# Patient Record
Sex: Female | Born: 1940
Health system: Southern US, Community
[De-identification: ages and names within clinical notes are randomized; demographics above are authoritative.]

## PROBLEM LIST (undated history)

## (undated) DIAGNOSIS — R32 Unspecified urinary incontinence: Secondary | ICD-10-CM

## (undated) DIAGNOSIS — M1990 Unspecified osteoarthritis, unspecified site: Secondary | ICD-10-CM

## (undated) DIAGNOSIS — M4800 Spinal stenosis, site unspecified: Secondary | ICD-10-CM

## (undated) DIAGNOSIS — F5104 Psychophysiologic insomnia: Secondary | ICD-10-CM

## (undated) DIAGNOSIS — E785 Hyperlipidemia, unspecified: Secondary | ICD-10-CM

## (undated) DIAGNOSIS — M797 Fibromyalgia: Secondary | ICD-10-CM

## (undated) DIAGNOSIS — R609 Edema, unspecified: Principal | ICD-10-CM

## (undated) DIAGNOSIS — M5431 Sciatica, right side: Secondary | ICD-10-CM

## (undated) DIAGNOSIS — D649 Anemia, unspecified: Secondary | ICD-10-CM

## (undated) DIAGNOSIS — N183 Chronic kidney disease, stage 3 (moderate): Secondary | ICD-10-CM

## (undated) DIAGNOSIS — F419 Anxiety disorder, unspecified: Secondary | ICD-10-CM

## (undated) DIAGNOSIS — F329 Major depressive disorder, single episode, unspecified: Secondary | ICD-10-CM

## (undated) DIAGNOSIS — R269 Unspecified abnormalities of gait and mobility: Secondary | ICD-10-CM

## (undated) DIAGNOSIS — I4891 Unspecified atrial fibrillation: Secondary | ICD-10-CM

## (undated) DIAGNOSIS — N3281 Overactive bladder: Secondary | ICD-10-CM

## (undated) DIAGNOSIS — E079 Disorder of thyroid, unspecified: Secondary | ICD-10-CM

## (undated) DIAGNOSIS — I10 Essential (primary) hypertension: Secondary | ICD-10-CM

## (undated) DIAGNOSIS — E669 Obesity, unspecified: Secondary | ICD-10-CM

## (undated) HISTORY — DX: Spinal stenosis, site unspecified: M48.00

## (undated) HISTORY — DX: Chronic kidney disease, stage 3 (moderate): N18.3

## (undated) HISTORY — DX: Obesity, unspecified: E66.9

## (undated) HISTORY — DX: Hyperlipidemia, unspecified: E78.5

## (undated) HISTORY — PX: SHOULDER SURGERY: SHX246

## (undated) HISTORY — DX: Unspecified osteoarthritis, unspecified site: M19.90

## (undated) HISTORY — DX: Disorder of thyroid, unspecified: E07.9

## (undated) HISTORY — DX: Unspecified urinary incontinence: R32

## (undated) HISTORY — DX: Overactive bladder: N32.81

## (undated) HISTORY — DX: Essential (primary) hypertension: I10

## (undated) HISTORY — DX: Unspecified atrial fibrillation: I48.91

## (undated) HISTORY — DX: Anemia, unspecified: D64.9

## (undated) HISTORY — DX: Anxiety disorder, unspecified: F41.9

## (undated) HISTORY — DX: Sciatica, right side: M54.31

## (undated) HISTORY — PX: THYROIDECTOMY, PARTIAL: SHX18

## (undated) HISTORY — DX: Unspecified abnormalities of gait and mobility: R26.9

## (undated) HISTORY — DX: Major depressive disorder, single episode, unspecified: F32.9

## (undated) HISTORY — DX: Edema, unspecified: R60.9

## (undated) HISTORY — DX: Fibromyalgia: M79.7

## (undated) HISTORY — DX: Psychophysiologic insomnia: F51.04

## (undated) HISTORY — PX: REPLACEMENT TOTAL KNEE BILATERAL: SUR1225

---

## 2011-09-07 ENCOUNTER — Encounter: Payer: Self-pay | Admitting: Internal Medicine

## 2011-09-11 ENCOUNTER — Other Ambulatory Visit: Payer: Self-pay | Admitting: Internal Medicine

## 2011-09-11 ENCOUNTER — Ambulatory Visit (INDEPENDENT_AMBULATORY_CARE_PROVIDER_SITE_OTHER): Payer: Medicare Other | Admitting: Internal Medicine

## 2011-09-11 ENCOUNTER — Encounter: Payer: Self-pay | Admitting: Internal Medicine

## 2011-09-11 ENCOUNTER — Other Ambulatory Visit (INDEPENDENT_AMBULATORY_CARE_PROVIDER_SITE_OTHER): Payer: Medicare Other

## 2011-09-11 VITALS — BP 128/84 | HR 78 | Temp 98.1°F | Ht 68.0 in | Wt 333.0 lb

## 2011-09-11 DIAGNOSIS — E079 Disorder of thyroid, unspecified: Secondary | ICD-10-CM | POA: Insufficient documentation

## 2011-09-11 DIAGNOSIS — F419 Anxiety disorder, unspecified: Secondary | ICD-10-CM | POA: Insufficient documentation

## 2011-09-11 DIAGNOSIS — E669 Obesity, unspecified: Secondary | ICD-10-CM

## 2011-09-11 DIAGNOSIS — E785 Hyperlipidemia, unspecified: Secondary | ICD-10-CM

## 2011-09-11 DIAGNOSIS — R5383 Other fatigue: Secondary | ICD-10-CM

## 2011-09-11 DIAGNOSIS — E119 Type 2 diabetes mellitus without complications: Secondary | ICD-10-CM | POA: Insufficient documentation

## 2011-09-11 DIAGNOSIS — IMO0001 Reserved for inherently not codable concepts without codable children: Secondary | ICD-10-CM

## 2011-09-11 DIAGNOSIS — F5104 Psychophysiologic insomnia: Secondary | ICD-10-CM

## 2011-09-11 DIAGNOSIS — R5381 Other malaise: Secondary | ICD-10-CM

## 2011-09-11 DIAGNOSIS — G8929 Other chronic pain: Secondary | ICD-10-CM

## 2011-09-11 DIAGNOSIS — I10 Essential (primary) hypertension: Secondary | ICD-10-CM

## 2011-09-11 DIAGNOSIS — M1990 Unspecified osteoarthritis, unspecified site: Secondary | ICD-10-CM | POA: Insufficient documentation

## 2011-09-11 DIAGNOSIS — M4800 Spinal stenosis, site unspecified: Secondary | ICD-10-CM

## 2011-09-11 DIAGNOSIS — F329 Major depressive disorder, single episode, unspecified: Secondary | ICD-10-CM | POA: Insufficient documentation

## 2011-09-11 DIAGNOSIS — M797 Fibromyalgia: Secondary | ICD-10-CM

## 2011-09-11 DIAGNOSIS — F32A Depression, unspecified: Secondary | ICD-10-CM | POA: Insufficient documentation

## 2011-09-11 DIAGNOSIS — G4700 Insomnia, unspecified: Secondary | ICD-10-CM

## 2011-09-11 DIAGNOSIS — M109 Gout, unspecified: Secondary | ICD-10-CM | POA: Insufficient documentation

## 2011-09-11 DIAGNOSIS — M5431 Sciatica, right side: Secondary | ICD-10-CM | POA: Insufficient documentation

## 2011-09-11 HISTORY — DX: Obesity, unspecified: E66.9

## 2011-09-11 HISTORY — DX: Sciatica, right side: M54.31

## 2011-09-11 HISTORY — DX: Depression, unspecified: F32.A

## 2011-09-11 HISTORY — DX: Psychophysiologic insomnia: F51.04

## 2011-09-11 HISTORY — DX: Fibromyalgia: M79.7

## 2011-09-11 HISTORY — DX: Spinal stenosis, site unspecified: M48.00

## 2011-09-11 LAB — URINALYSIS, ROUTINE W REFLEX MICROSCOPIC
Ketones, ur: NEGATIVE
Specific Gravity, Urine: 1.025 (ref 1.000–1.030)
Urine Glucose: NEGATIVE
Urobilinogen, UA: 0.2 (ref 0.0–1.0)
pH: 6 (ref 5.0–8.0)

## 2011-09-11 LAB — CBC WITH DIFFERENTIAL/PLATELET
Basophils Absolute: 0 10*3/uL (ref 0.0–0.1)
Eosinophils Relative: 3.7 % (ref 0.0–5.0)
HCT: 43.1 % (ref 36.0–46.0)
Hemoglobin: 14.5 g/dL (ref 12.0–15.0)
Lymphocytes Relative: 25.4 % (ref 12.0–46.0)
Lymphs Abs: 2.1 10*3/uL (ref 0.7–4.0)
Monocytes Relative: 7.2 % (ref 3.0–12.0)
Neutro Abs: 5.3 10*3/uL (ref 1.4–7.7)
RBC: 4.46 Mil/uL (ref 3.87–5.11)
WBC: 8.5 10*3/uL (ref 4.5–10.5)

## 2011-09-11 LAB — HEPATIC FUNCTION PANEL
AST: 19 U/L (ref 0–37)
Albumin: 4.2 g/dL (ref 3.5–5.2)
Total Protein: 7.9 g/dL (ref 6.0–8.3)

## 2011-09-11 LAB — MICROALBUMIN / CREATININE URINE RATIO
Creatinine,U: 190.8 mg/dL
Microalb Creat Ratio: 5 mg/g (ref 0.0–30.0)
Microalb, Ur: 9.5 mg/dL — ABNORMAL HIGH (ref 0.0–1.9)

## 2011-09-11 LAB — BASIC METABOLIC PANEL
BUN: 33 mg/dL — ABNORMAL HIGH (ref 6–23)
CO2: 28 mEq/L (ref 19–32)
Calcium: 9.9 mg/dL (ref 8.4–10.5)
Creatinine, Ser: 1.1 mg/dL (ref 0.4–1.2)
GFR: 51.72 mL/min — ABNORMAL LOW (ref >60.00)
Glucose, Bld: 105 mg/dL — ABNORMAL HIGH (ref 70–99)
Sodium: 141 mEq/L (ref 135–145)

## 2011-09-11 LAB — LIPID PANEL
Cholesterol: 219 mg/dL — ABNORMAL HIGH (ref 0–200)
HDL: 48.2 mg/dL (ref >39.00)
Total CHOL/HDL Ratio: 5
Triglycerides: 161 mg/dL — ABNORMAL HIGH (ref 0.0–149.0)

## 2011-09-11 LAB — LDL CHOLESTEROL, DIRECT: Direct LDL: 143.6 mg/dL

## 2011-09-11 LAB — TSH: TSH: 1.92 u[IU]/mL (ref 0.35–5.50)

## 2011-09-11 MED ORDER — OXYCODONE-ACETAMINOPHEN 5-325 MG PO TABS: 1.0000 | ORAL_TABLET | Freq: Every day | ORAL | Status: DC | PRN

## 2011-09-11 MED ORDER — ZOLPIDEM TARTRATE ER 6.25 MG PO TBCR: 6.2500 mg | EXTENDED_RELEASE_TABLET | Freq: Every evening | ORAL | Status: DC | PRN

## 2011-09-11 MED ORDER — DULOXETINE HCL 60 MG PO CPEP: 60.0000 mg | ORAL_CAPSULE | Freq: Every day | ORAL | Status: DC

## 2011-09-11 MED ORDER — CEPHALEXIN 500 MG PO CAPS: 500.0000 mg | ORAL_CAPSULE | Freq: Four times a day (QID) | ORAL | Status: AC

## 2011-09-11 MED ORDER — ATORVASTATIN CALCIUM 40 MG PO TABS: 40.0000 mg | ORAL_TABLET | Freq: Every day | ORAL | Status: DC

## 2011-09-11 NOTE — Assessment & Plan Note (Addendum)
With chronic pain to right side, hip, knee, essentially non wt bearing on the right side - progressively for the last 5 yrs, has not yet seen ortho for back, hip and knee recently, has rejected ESI in the past , now with walker with seat  (cane not enough), uses walker in the home, ok for home health assessment  Note:   Pt history, examination, determination of and d/w pt of diagnoses and plan with >40 min face to face, total >50 min with documentation and arranging tests and referrals

## 2011-09-11 NOTE — Assessment & Plan Note (Signed)
stable overall by hx and exam,, and pt to continue medical treatment as before, for labs today

## 2011-09-11 NOTE — Assessment & Plan Note (Signed)
Overall stable but out of meds - for pain med refill -

## 2011-09-11 NOTE — Patient Instructions (Addendum)
Please sign the Release of Information form to allow [place] to get the information from [place] Take all new medications as prescribed - the sleep medication Continue all other medications as before - you are given the refills today You will be contacted regarding the referral for: MRI for the lower back, and the orthopedic Please go to LAB in the Basement for the blood and/or urine tests to be done today Please call the phone number [phone] (the PhoneTree System) for results of testing in 2-3 days;  When calling, simply dial the number, and when prompted enter the MRN number above (the Medical Record Number) and the # key, then the message should start. You will be contacted regarding the referral for: Home Health The cymbalta was sent to the pharmacy Please return in 3 months

## 2011-09-12 NOTE — Progress Notes (Signed)
Subjective:    Patient ID: Bonnie Clark, female    DOB: 08/26/1941, 70 y.o.   MRN: [id number]  HPI Here as new pt to establish;  Here to f/u; overall doing ok,  Pt denies chest pain, increased sob or doe, wheezing, orthopnea, PND, increased LE swelling, palpitations, dizziness or syncope.  Pt denies new neurological symptoms such as new headache, or facial or extremity weakness or numbness   Pt denies polydipsia, polyuria, or low sugar symptoms such as weakness or confusion improved with po intake.  Pt states overall good compliance with meds, trying to follow lower cholesterol, diabetic diet, wt overall stable but little exercise however.  Has ongoing spinal stenosis, and has marked difficulty with ambulation for several yrs, sits to be pushed currently on a rolling walker with seat, states she needs DME for the home, wheelchair and prob scooter, but has not had ongoing ortho eval for recent worsening LBP/right leg/right knee pain.  Etiology unclear, Exam otherwise benign, to check labs as documented, follow with expectant management  Does have ongoing sleep difficulty as well. Past Medical History  Diagnosis Date  . Urine incontinence   . Hx: UTI (urinary tract infection)   . Arthritis   . Depression   . Diabetes mellitus   . Hyperlipidemia   . Hypertension   . Thyroid disease   . Obesity 09/11/2011  . Chronic insomnia 09/11/2011  . Spinal stenosis 09/11/2011  . Fibromyalgia 09/11/2011  . Anxiety and depression 09/11/2011  . Sciatica of right side 09/11/2011  . Gout 09/11/2011   Past Surgical History  Procedure Date  . Thyroidectomy, partial     at 70yo  . Shoulder surgery     right - approx 2005  . Replacement total knee bilateral     right approx 1994, left approx 1999    reports that she has never smoked. She does not have any smokeless tobacco history on file. She reports that she does not drink alcohol or use illicit drugs. family history includes Arthritis in her father and  mother and Depression in her mother. No Known Allergies No current outpatient prescriptions on file prior to visit.   Review of Systems Review of Systems  Constitutional: Negative for diaphoresis and unexpected weight change.  HENT: Negative for drooling and tinnitus.   Eyes: Negative for photophobia and visual disturbance.  Respiratory: Negative for choking and stridor.   Gastrointestinal: Negative for vomiting and blood in stool.  Genitourinary: Negative for hematuria and decreased urine volume.       Objective:   Physical Exam BP 128/84  Pulse 78  Temp(Src) 98.1 F (36.7 C) (Oral)  Ht 5\' 8"  (1.727 m)  Wt 333 lb (151.048 kg)  BMI 50.63 kg/m2  SpO2 98% Physical Exam  VS noted, morbid obese Constitutional: Pt appears well-developed and well-nourished.  HENT: Head: Normocephalic.  Right Ear: External ear normal.  Left Ear: External ear normal.  Eyes: Conjunctivae and EOM are normal. Pupils are equal, round, and reactive to light.  Neck: Normal range of motion. Neck supple.  Cardiovascular: Normal rate and regular rhythm.   Pulmonary/Chest: Effort normal and breath sounds normal.  Abd:  Soft, NT, non-distended, + BS Neurological: Pt is alert. No cranial nerve deficit. o/w not done detail Skin: Skin is warm. No erythema.  Psychiatric: Pt behavior is normal. Thought content normal. 2+ nervous Lumbar diffuse tender, nonswelling, no rash or erythema; right knee with diffuse bony DJD change, trace effusion, NT     Assessment & Plan:

## 2011-09-14 ENCOUNTER — Encounter: Payer: Self-pay | Admitting: Internal Medicine

## 2011-09-14 NOTE — Assessment & Plan Note (Signed)
stable overall by hx and exam,t, and pt to continue medical treatment as before   

## 2011-09-14 NOTE — Assessment & Plan Note (Signed)
Etiology unclear, Exam otherwise benign, to check labs as documented, follow with expectant management  

## 2011-09-14 NOTE — Assessment & Plan Note (Signed)
stable overall by hx and exam, most recent data reviewed with pt, and pt to continue medical treatment as before  BP Readings from Last 3 Encounters:  09/11/11 128/84

## 2011-09-14 NOTE — Assessment & Plan Note (Signed)
Ok for ambien prn,.  to f/u any worsening symptoms or concerns  

## 2011-09-18 ENCOUNTER — Other Ambulatory Visit: Payer: Self-pay | Admitting: Internal Medicine

## 2011-09-18 MED ORDER — DIAZEPAM 5 MG PO TABS: ORAL_TABLET | ORAL | Status: DC

## 2011-09-22 ENCOUNTER — Inpatient Hospital Stay: Admission: RE | Admit: 2011-09-22 | Payer: Medicare Other | Source: Ambulatory Visit

## 2011-09-22 ENCOUNTER — Ambulatory Visit: Payer: Medicare Other

## 2011-09-26 ENCOUNTER — Ambulatory Visit (INDEPENDENT_AMBULATORY_CARE_PROVIDER_SITE_OTHER): Payer: Medicare Other

## 2011-09-26 ENCOUNTER — Ambulatory Visit
Admission: RE | Admit: 2011-09-26 | Discharge: 2011-09-26 | Disposition: A | Discharge status: Home / Self Care | Payer: Medicare Other | Source: Ambulatory Visit | Attending: Internal Medicine | Admitting: Internal Medicine

## 2011-09-26 DIAGNOSIS — M4800 Spinal stenosis, site unspecified: Secondary | ICD-10-CM

## 2011-09-26 DIAGNOSIS — Z23 Encounter for immunization: Secondary | ICD-10-CM

## 2011-09-30 DIAGNOSIS — E119 Type 2 diabetes mellitus without complications: Secondary | ICD-10-CM

## 2011-09-30 DIAGNOSIS — M4800 Spinal stenosis, site unspecified: Secondary | ICD-10-CM

## 2011-09-30 DIAGNOSIS — A499 Bacterial infection, unspecified: Secondary | ICD-10-CM

## 2011-09-30 DIAGNOSIS — N390 Urinary tract infection, site not specified: Secondary | ICD-10-CM

## 2011-10-02 ENCOUNTER — Other Ambulatory Visit: Payer: Self-pay | Admitting: Specialist

## 2011-10-02 DIAGNOSIS — M48061 Spinal stenosis, lumbar region without neurogenic claudication: Secondary | ICD-10-CM

## 2011-10-08 ENCOUNTER — Ambulatory Visit
Admission: RE | Admit: 2011-10-08 | Discharge: 2011-10-08 | Disposition: A | Discharge status: Home / Self Care | Payer: Medicare Other | Source: Ambulatory Visit | Attending: Specialist | Admitting: Specialist

## 2011-10-08 DIAGNOSIS — M48061 Spinal stenosis, lumbar region without neurogenic claudication: Secondary | ICD-10-CM

## 2011-10-08 MED ORDER — IOHEXOL 300 MG/ML  SOLN
10.0000 mL | Freq: Once | INTRAMUSCULAR | Status: AC | PRN
  Administered 2011-10-08: 10 mL via INTRATHECAL

## 2011-10-08 MED ORDER — HYDROCODONE-ACETAMINOPHEN 5-325 MG PO TABS
2.0000 | ORAL_TABLET | Freq: Once | ORAL | Status: AC
  Administered 2011-10-08: 2 via ORAL

## 2011-10-08 MED ORDER — DIAZEPAM 5 MG PO TABS
5.0000 mg | ORAL_TABLET | Freq: Once | ORAL | Status: AC
  Administered 2011-10-08: 5 mg via ORAL

## 2011-10-08 NOTE — Patient Instructions (Signed)

## 2011-10-08 NOTE — Progress Notes (Signed)
Husband at bedside.  Patient resting comfortably at present.  jkl

## 2011-10-21 ENCOUNTER — Telehealth: Payer: Self-pay

## 2011-10-21 MED ORDER — ZOLPIDEM TARTRATE 5 MG PO TABS: 5.0000 mg | ORAL_TABLET | Freq: Every evening | ORAL | Status: DC | PRN

## 2011-10-21 NOTE — Telephone Encounter (Signed)
Ok for change - Done hardcopy to robin  

## 2011-10-21 NOTE — Telephone Encounter (Signed)
Insurance will not pay for Zolipidem CR 6.25, Insurance may pay for regular Zolpidem. [facility]

## 2011-10-22 NOTE — Telephone Encounter (Signed)
Faxed hardcopy to pharmacy. 

## 2011-11-27 DIAGNOSIS — M4800 Spinal stenosis, site unspecified: Secondary | ICD-10-CM | POA: Diagnosis not present

## 2011-11-27 DIAGNOSIS — E119 Type 2 diabetes mellitus without complications: Secondary | ICD-10-CM | POA: Diagnosis not present

## 2011-11-27 DIAGNOSIS — N390 Urinary tract infection, site not specified: Secondary | ICD-10-CM | POA: Diagnosis not present

## 2011-11-27 DIAGNOSIS — A499 Bacterial infection, unspecified: Secondary | ICD-10-CM | POA: Diagnosis not present

## 2011-11-27 DIAGNOSIS — F329 Major depressive disorder, single episode, unspecified: Secondary | ICD-10-CM | POA: Diagnosis not present

## 2011-11-27 DIAGNOSIS — IMO0001 Reserved for inherently not codable concepts without codable children: Secondary | ICD-10-CM | POA: Diagnosis not present

## 2011-12-05 DIAGNOSIS — M4800 Spinal stenosis, site unspecified: Secondary | ICD-10-CM | POA: Diagnosis not present

## 2011-12-05 DIAGNOSIS — E119 Type 2 diabetes mellitus without complications: Secondary | ICD-10-CM | POA: Diagnosis not present

## 2011-12-05 DIAGNOSIS — N390 Urinary tract infection, site not specified: Secondary | ICD-10-CM | POA: Diagnosis not present

## 2011-12-05 DIAGNOSIS — A499 Bacterial infection, unspecified: Secondary | ICD-10-CM | POA: Diagnosis not present

## 2011-12-15 DIAGNOSIS — A499 Bacterial infection, unspecified: Secondary | ICD-10-CM | POA: Diagnosis not present

## 2011-12-15 DIAGNOSIS — IMO0001 Reserved for inherently not codable concepts without codable children: Secondary | ICD-10-CM | POA: Diagnosis not present

## 2011-12-15 DIAGNOSIS — F329 Major depressive disorder, single episode, unspecified: Secondary | ICD-10-CM | POA: Diagnosis not present

## 2011-12-15 DIAGNOSIS — M4800 Spinal stenosis, site unspecified: Secondary | ICD-10-CM | POA: Diagnosis not present

## 2011-12-15 DIAGNOSIS — E119 Type 2 diabetes mellitus without complications: Secondary | ICD-10-CM | POA: Diagnosis not present

## 2011-12-15 DIAGNOSIS — N390 Urinary tract infection, site not specified: Secondary | ICD-10-CM | POA: Diagnosis not present

## 2011-12-17 DIAGNOSIS — M4800 Spinal stenosis, site unspecified: Secondary | ICD-10-CM | POA: Diagnosis not present

## 2011-12-17 DIAGNOSIS — F329 Major depressive disorder, single episode, unspecified: Secondary | ICD-10-CM | POA: Diagnosis not present

## 2011-12-17 DIAGNOSIS — IMO0001 Reserved for inherently not codable concepts without codable children: Secondary | ICD-10-CM | POA: Diagnosis not present

## 2011-12-17 DIAGNOSIS — A499 Bacterial infection, unspecified: Secondary | ICD-10-CM | POA: Diagnosis not present

## 2011-12-17 DIAGNOSIS — E119 Type 2 diabetes mellitus without complications: Secondary | ICD-10-CM | POA: Diagnosis not present

## 2011-12-17 DIAGNOSIS — N390 Urinary tract infection, site not specified: Secondary | ICD-10-CM | POA: Diagnosis not present

## 2011-12-21 ENCOUNTER — Encounter: Payer: Self-pay | Admitting: Internal Medicine

## 2011-12-21 DIAGNOSIS — R269 Unspecified abnormalities of gait and mobility: Secondary | ICD-10-CM

## 2011-12-21 HISTORY — DX: Unspecified abnormalities of gait and mobility: R26.9

## 2011-12-22 ENCOUNTER — Encounter: Payer: Self-pay | Admitting: Internal Medicine

## 2011-12-22 ENCOUNTER — Telehealth: Payer: Self-pay | Admitting: Internal Medicine

## 2011-12-22 ENCOUNTER — Ambulatory Visit (INDEPENDENT_AMBULATORY_CARE_PROVIDER_SITE_OTHER): Payer: Medicare Other | Admitting: Internal Medicine

## 2011-12-22 ENCOUNTER — Other Ambulatory Visit (INDEPENDENT_AMBULATORY_CARE_PROVIDER_SITE_OTHER): Payer: Medicare Other

## 2011-12-22 VITALS — BP 104/70 | HR 52 | Temp 97.0°F

## 2011-12-22 DIAGNOSIS — G4700 Insomnia, unspecified: Secondary | ICD-10-CM | POA: Diagnosis not present

## 2011-12-22 DIAGNOSIS — F5104 Psychophysiologic insomnia: Secondary | ICD-10-CM

## 2011-12-22 DIAGNOSIS — M4800 Spinal stenosis, site unspecified: Secondary | ICD-10-CM

## 2011-12-22 DIAGNOSIS — G8929 Other chronic pain: Secondary | ICD-10-CM

## 2011-12-22 DIAGNOSIS — IMO0001 Reserved for inherently not codable concepts without codable children: Secondary | ICD-10-CM

## 2011-12-22 DIAGNOSIS — E785 Hyperlipidemia, unspecified: Secondary | ICD-10-CM

## 2011-12-22 LAB — LIPID PANEL
Cholesterol: 155 mg/dL (ref 0–200)
HDL: 45.4 mg/dL (ref >39.00)
LDL Cholesterol: 85 mg/dL (ref 0–99)
VLDL: 24.6 mg/dL (ref 0.0–40.0)

## 2011-12-22 LAB — BASIC METABOLIC PANEL
BUN: 21 mg/dL (ref 6–23)
Calcium: 9.6 mg/dL (ref 8.4–10.5)
Creatinine, Ser: 0.9 mg/dL (ref 0.4–1.2)
GFR: 65.82 mL/min (ref >60.00)
Glucose, Bld: 144 mg/dL — ABNORMAL HIGH (ref 70–99)
Potassium: 4.9 mEq/L (ref 3.5–5.1)

## 2011-12-22 MED ORDER — METFORMIN HCL 500 MG PO TABS: 500.0000 mg | ORAL_TABLET | Freq: Every day | ORAL | Status: DC

## 2011-12-22 MED ORDER — GLUCOSE BLOOD VI STRP: ORAL_STRIP | Status: DC

## 2011-12-22 MED ORDER — OXYCODONE-ACETAMINOPHEN 10-325 MG PO TABS: 1.0000 | ORAL_TABLET | Freq: Three times a day (TID) | ORAL | Status: AC | PRN

## 2011-12-22 NOTE — Assessment & Plan Note (Addendum)
Without significant improvement in stregnth despite MRI/ortho/ESI/PT eval and tx, needs power wheelchair - for rx to be faxed today  Note:  Total time for pt hx, exam, determination of dx and plan for eval and tx is > 40 min

## 2011-12-22 NOTE — Progress Notes (Signed)
Subjective:    Patient ID: Bonnie Clark, female    DOB: 01/07/1941, 71 y.o.   MRN: [id number]  HPI  Here for f/u and power wheelchair exam after seeing orthopedic - Dr Shelle Iron, and Dr Ethelene Hal s/p ESI x 1, pin to the right side helped at least 50% for some time, and to repeat the [facility] tomorrow, mobility somewhat improved, but still rather severe as detailed by recent home PT assessment as well with recommendation for power chair.   Sleeping still very difficult due to pain and hard to position, despite ambien use, which did not seem to help as pain was too much.  Ambulates minimally,  Currently not able to get about in her current manual wheelchair due to overall general debility and lack of good upper body strength. She has to sit on the side of the bed, and cannot lift legs;  Husband has to lift legs to get her into bed;  Pt also unable to get OOB, has to wake the husband for help to get to BR at night when needed.   Denies urinary symptoms such as dysuria, frequency, urgency,or hematuria.   Pt denies chest pain, increased sob or doe, wheezing, orthopnea, PND, increased LE swelling, palpitations, dizziness or syncope.  Pt denies new neurological symptoms such as new headache, or facial or extremity weakness or numbness   Pt denies polydipsia, polyuria, or low sugar symptoms such as weakness or confusion improved with po intake.  Pt states overall good compliance with meds, trying to follow lower cholesterol, diabetic diet, wt overall stable.  Tolerating the new lipitor ok.   Denies urinary symptoms such as dysuria, frequency, urgency,or hematuria. Past Medical History  Diagnosis Date  . Urine incontinence   . Hx: UTI (urinary tract infection)   . Arthritis   . Depression   . Diabetes mellitus   . Hyperlipidemia   . Hypertension   . Thyroid disease   . Obesity 09/11/2011  . Chronic insomnia 09/11/2011  . Spinal stenosis 09/11/2011  . Fibromyalgia 09/11/2011  . Anxiety and depression 09/11/2011  .  Sciatica of right side 09/11/2011  . Gout 09/11/2011  . Gait disorder 12/21/2011   Past Surgical History  Procedure Date  . Thyroidectomy, partial     at 71yo  . Shoulder surgery     right - approx 2005  . Replacement total knee bilateral     right approx 1994, left approx 1999    reports that she has never smoked. She does not have any smokeless tobacco history on file. She reports that she does not drink alcohol or use illicit drugs. family history includes Arthritis in her father and mother and Depression in her mother. No Known Allergies Current Outpatient Prescriptions on File Prior to Visit  Medication Sig Dispense Refill  . allopurinol (ZYLOPRIM) 300 MG tablet Take 1 by mouth daily      . aspirin 81 MG tablet Take 81 mg by mouth daily.        [name] Kitchen atorvastatin (LIPITOR) 40 MG tablet Take 1 tablet (40 mg total) by mouth daily.  90 tablet  3  . colchicine 0.6 MG tablet Take 0.6 mg by mouth daily.        . DULoxetine (CYMBALTA) 60 MG capsule Take 1 capsule (60 mg total) by mouth daily. Take 1 by mouth daily  90 capsule  3  . furosemide (LASIX) 20 MG tablet Take 20 mg by mouth daily.        [name] Kitchen ibuprofen (ADVIL)  200 MG tablet Take 400 mg by mouth 2 (two) times daily.        [name] Kitchen lisinopril (PRINIVIL,ZESTRIL) 5 MG tablet Take 1 by mouth daily       Review of Systems Review of Systems  Constitutional: Negative for diaphoresis and unexpected weight change.  HENT: Negative for drooling and tinnitus.   Eyes: Negative for photophobia and visual disturbance.  Respiratory: Negative for choking and stridor.   Gastrointestinal: Negative for vomiting and blood in stool.  Genitourinary: Negative for hematuria and decreased urine volume.  Objective:   Physical Exam BP 104/70  Pulse 52  Temp(Src) 97 F (36.1 C) (Oral)  SpO2 99% Physical Exam  VS noted, not ill appearing Constitutional: Pt appears well-developed and well-nourished.  HENT: Head: Normocephalic.  Right Ear: External ear normal.    Left Ear: External ear normal.  Eyes: Conjunctivae and EOM are normal. Pupils are equal, round, and reactive to light.  Neck: Normal range of motion. Neck supple.  Cardiovascular: Normal rate and regular rhythm.   Pulmonary/Chest: Effort normal and breath sounds normal.  Skin: Skin is warm. No erythema. No LE edema Spine diffuse tenderness, worse to right paravertebral RLE strength 2/5, LLE 3/5.  Psychiatric: Pt behavior is normal. Thought content normal. 1+ nervous    Assessment & Plan:

## 2011-12-22 NOTE — Assessment & Plan Note (Signed)
Uncontrolled last visit , now on lipitor - tolerating well, for lipids today  Lab Results  Component Value Date   HGBA1C 6.5 09/11/2011    LDL  Last - 165  - oct 2012  Goal ldl < 70

## 2011-12-22 NOTE — Assessment & Plan Note (Signed)
stable overall by hx and exam, most recent data reviewed with pt, and pt to continue medical treatment as before Lab Results  Component Value Date   HGBA1C 6.5 09/11/2011    

## 2011-12-22 NOTE — Patient Instructions (Signed)
Take all new medications as prescribed - the increased oxycodone prescription OK to stop the [place] Continue all other medications as before, including the glucose strips Please call if you need further medication refills (or have the pharmacy call) Please go to LAB in the Basement for the blood and/or urine tests to be done today Please call the phone number [phone] (the PhoneTree System) for results of testing in 2-3 days;  When calling, simply dial the number, and when prompted enter the MRN number above (the Medical Record Number) and the # key, then the message should start. Please return in 6 months, or sooner if needed

## 2011-12-22 NOTE — Assessment & Plan Note (Signed)
Hopefully helped with increased oxycodone, to d/c the [place]

## 2011-12-22 NOTE — Telephone Encounter (Signed)
Lab with mild elev a1c to 7.5 ; goal < 7.0  To start metformin 500 qd  Zella Ball to notify pt

## 2011-12-22 NOTE — Assessment & Plan Note (Signed)
Uncontrolled, for percocet 10 tid prn,  to f/u any worsening symptoms or concerns

## 2011-12-23 DIAGNOSIS — M48061 Spinal stenosis, lumbar region without neurogenic claudication: Secondary | ICD-10-CM | POA: Diagnosis not present

## 2011-12-23 NOTE — Telephone Encounter (Signed)
Patient informed. 

## 2011-12-25 ENCOUNTER — Other Ambulatory Visit: Payer: Self-pay | Admitting: Internal Medicine

## 2011-12-25 MED ORDER — GLUCOSE BLOOD VI STRP: ORAL_STRIP | Status: AC

## 2011-12-26 DIAGNOSIS — N390 Urinary tract infection, site not specified: Secondary | ICD-10-CM | POA: Diagnosis not present

## 2011-12-26 DIAGNOSIS — IMO0001 Reserved for inherently not codable concepts without codable children: Secondary | ICD-10-CM | POA: Diagnosis not present

## 2011-12-26 DIAGNOSIS — E119 Type 2 diabetes mellitus without complications: Secondary | ICD-10-CM | POA: Diagnosis not present

## 2011-12-26 DIAGNOSIS — A499 Bacterial infection, unspecified: Secondary | ICD-10-CM | POA: Diagnosis not present

## 2011-12-26 DIAGNOSIS — M4800 Spinal stenosis, site unspecified: Secondary | ICD-10-CM | POA: Diagnosis not present

## 2011-12-26 DIAGNOSIS — F329 Major depressive disorder, single episode, unspecified: Secondary | ICD-10-CM | POA: Diagnosis not present

## 2012-01-05 ENCOUNTER — Ambulatory Visit (INDEPENDENT_AMBULATORY_CARE_PROVIDER_SITE_OTHER): Payer: Medicare Other | Admitting: Internal Medicine

## 2012-01-05 ENCOUNTER — Encounter: Payer: Self-pay | Admitting: Internal Medicine

## 2012-01-05 VITALS — BP 112/82 | HR 62 | Temp 97.0°F | Ht 67.5 in | Wt 333.0 lb

## 2012-01-05 DIAGNOSIS — M4800 Spinal stenosis, site unspecified: Secondary | ICD-10-CM | POA: Diagnosis not present

## 2012-01-05 DIAGNOSIS — I10 Essential (primary) hypertension: Secondary | ICD-10-CM | POA: Diagnosis not present

## 2012-01-05 MED ORDER — METFORMIN HCL ER 500 MG PO TB24: 500.0000 mg | ORAL_TABLET | Freq: Every day | ORAL | Status: DC

## 2012-01-05 NOTE — Assessment & Plan Note (Addendum)
stable overall by hx and exam, most recent data reviewed with pt, and pt to continue medical treatment as before except change to the ER version of metformin for better tolerability Lab Results  Component Value Date   HGBA1C 7.5* 12/22/2011

## 2012-01-05 NOTE — Progress Notes (Signed)
Subjective:    Patient ID: Bonnie Clark, female    DOB: April 24, 1941, 71 y.o.   MRN: [id number]  HPI The main reasson for the visit today is mobility assessment. Has been seeing orthopedic - Dr Shelle Iron, and Dr Ethelene Hal s/p ESI x 1, pin to the right side helped at least 50% for some time, and to repeat the [facility] tomorrow, mobility somewhat improved, but still rather severe as detailed by recent home PT assessment as well with recommendation for power chair. Sleeping still very difficult due to pain and hard to position, despite ambien use, which did not seem to help as pain was too much. Ambulates minimally, Currently not able to get about in her current manual wheelchair due to overall general debility and lack of good upper body strength. She has to sit on the side of the bed, and cannot lift legs; Husband has to lift legs to get her into bed; Pt also unable to get OOB, has to wake the husband for help to get to BR at night when needed.  Also noted today is s/p right shoulder replacement surgury with cont'd chronic pain, weakness or UE and decreased ROM, as well as left shoulder with same pain/weakness/decreased ROM but no prior surgury (though it was recommended per pt).  Has recurrent cervicalgia as well exacerbated by UE use but no surgical hx or radicular symptoms.  Her specific problems requiring includes spinal stenosis, right sided sciatica recurrent, fibromyalgia, gait disorder as well as chronic bilat shoulder pain and rotater cuff dz despite right shoulder replaceement.  Specifically cannot ambulate room to room in the home, toilet or bathe herself, food prepare for herself or even help with dressing.  Cannot use cane due to inability to ambulate safely due to back pain and shoulder pain with cane use.  Cannot use manual wheelchair due to bilat shoulder pain which is at baseline about 4/10, but 8/10 with use such as lifting or trying to use in propelling manual wheelchair.  Cannot use scooter POV for similar -  unable to effectivly use the tiller mechanism due to uncontrolled increased bilat shoulder pain with such movement, and unable to effect safe transfer, also does not have room in the home to accomodate the turning radius. Pt does have mental and physical capability to operate power wheechair safely in the home.  Pt is willing and motivated to use the power wheelchair in the home.  Also mentions today the metformin 500 mg causes nausea, wants to change  Past Medical History  Diagnosis Date  . Urine incontinence   . Hx: UTI (urinary tract infection)   . Arthritis   . Depression   . Diabetes mellitus   . Hyperlipidemia   . Hypertension   . Thyroid disease   . Obesity 09/11/2011  . Chronic insomnia 09/11/2011  . Spinal stenosis 09/11/2011  . Fibromyalgia 09/11/2011  . Anxiety and depression 09/11/2011  . Sciatica of right side 09/11/2011  . Gout 09/11/2011  . Gait disorder 12/21/2011   Past Surgical History  Procedure Date  . Thyroidectomy, partial     at 71yo  . Shoulder surgery     right - approx 2005  . Replacement total knee bilateral     right approx 1994, left approx 1999    reports that she has never smoked. She does not have any smokeless tobacco history on file. She reports that she does not drink alcohol or use illicit drugs. family history includes Arthritis in her father and mother and  Depression in her mother. No Known Allergies Current Outpatient Prescriptions on File Prior to Visit  Medication Sig Dispense Refill  . allopurinol (ZYLOPRIM) 300 MG tablet Take 1 by mouth daily      . aspirin 81 MG tablet Take 81 mg by mouth daily.        [name] Kitchen atorvastatin (LIPITOR) 40 MG tablet Take 1 tablet (40 mg total) by mouth daily.  90 tablet  3  . colchicine 0.6 MG tablet Take 0.6 mg by mouth daily.        . DULoxetine (CYMBALTA) 60 MG capsule Take 1 capsule (60 mg total) by mouth daily. Take 1 by mouth daily  90 capsule  3  . furosemide (LASIX) 20 MG tablet Take 20 mg by mouth  daily.        [name] Kitchen glucose blood (ONE TOUCH ULTRA TEST) test strip 1 strip per day - Use as instructed  250.02  100 each  12  . ibuprofen (ADVIL) 200 MG tablet Take 400 mg by mouth 2 (two) times daily.        [name] Kitchen lisinopril (PRINIVIL,ZESTRIL) 5 MG tablet Take 1 by mouth daily         Review of Systems Review of Systems  Constitutional: Negative for diaphoresis and unexpected weight change.  HENT: Negative for drooling and tinnitus.   Eyes: Negative for photophobia and visual disturbance.  Respiratory: Negative for choking and stridor.   Gastrointestinal: Negative for vomiting and blood in stool.  Genitourinary: Negative for hematuria and decreased urine volume.     Objective:   Physical Exam BP 112/82  Pulse 62  Temp(Src) 97 F (36.1 C) (Oral)  Ht 5' 7.5" (1.715 m)  Wt 333 lb (151.048 kg)  BMI 51.39 kg/m2  SpO2 97% Physical Exam  VS noted, morbid obese Constitutional: Pt appears well-developed and well-nourished.  HENT: Head: Normocephalic.  Right Ear: External ear normal.  Left Ear: External ear normal.  Eyes: Conjunctivae and EOM are normal. Pupils are equal, round, and reactive to light.  Neck: Normal range of motion. Neck supple.NT spine .  Cardiovascular: Normal rate and regular rhythm.   Pulmonary/Chest: Effort normal and breath sounds normal.  Abd:  Soft, NT, non-distended, + BS Neurological: Pt is alert. No cranial nerve deficit. motor 4/5 to UE limited due to pain with dtr/sens intact Lumbar Spine diffuse mod tenderness, worse to right paravertebral  RLE strength 2/5, LLE 3/5.  Skin: Skin is warm. No erythema.  Psychiatric: Pt behavior is normal. Thought content normal.     Assessment & Plan:

## 2012-01-05 NOTE — Assessment & Plan Note (Addendum)
stable overall by hx and exam, , and pt to continue medical treatment as before, for power wheelchair forms filled out  Total time for evaluation including history, examination, review of record with pt, determination of diagnosis, as well as plan for further eval/treatment is > 40 min;  Total with documentation > 50 min

## 2012-01-05 NOTE — Assessment & Plan Note (Signed)
stable overall by hx and exam, most recent data reviewed with pt, and pt to continue medical treatment as before  BP Readings from Last 3 Encounters:  01/05/12 112/82  12/22/11 104/70  10/08/11 120/59

## 2012-01-05 NOTE — Patient Instructions (Signed)
OK to stop the current short acting metformin 500 mg Start the metformin 500 mg "ER" version Continue all other medications as before The documentation will be completed as discussed for the power wheelchair and faxed tomorrow

## 2012-01-06 ENCOUNTER — Telehealth: Payer: Self-pay

## 2012-01-06 NOTE — Telephone Encounter (Signed)
Chart signed and completed  Please fax signed note to Ms Carolin Coy

## 2012-01-06 NOTE — Telephone Encounter (Signed)
[facility] needs for the MD to lock, close the office note from yesterdays OV (01/05/12). They received copy of note this am, but will need to receive  A copy as well of closed note.

## 2012-01-07 DIAGNOSIS — M48061 Spinal stenosis, lumbar region without neurogenic claudication: Secondary | ICD-10-CM | POA: Diagnosis not present

## 2012-01-07 NOTE — Telephone Encounter (Signed)
Faxed notes to Zenia Resides with [facility]. Also called her left message notes faxed.

## 2012-01-08 ENCOUNTER — Telehealth: Payer: Self-pay | Admitting: Internal Medicine

## 2012-01-08 DIAGNOSIS — Z01810 Encounter for preprocedural cardiovascular examination: Secondary | ICD-10-CM

## 2012-01-08 NOTE — Telephone Encounter (Signed)
The last one was about 10 yrs. Ago.

## 2012-01-08 NOTE — Telephone Encounter (Signed)
Due to her age and cardiac risk factors, she should have stress test before surgury  Done per emr

## 2012-01-08 NOTE — Telephone Encounter (Signed)
Patient informed. 

## 2012-01-08 NOTE — Telephone Encounter (Signed)
Received request for preop clearance for lumbar decompression surgury  Robin to contact pt - has she had  Stress test in the past 1-2 yrs?

## 2012-01-21 ENCOUNTER — Other Ambulatory Visit (HOSPITAL_COMMUNITY): Payer: Medicare Other

## 2012-02-18 ENCOUNTER — Telehealth: Payer: Self-pay

## 2012-02-18 NOTE — Telephone Encounter (Signed)
Called the patient for several days to inquire per MD if still having ortho surgery, if so would need stress test. Left messages to call back.

## 2012-02-19 NOTE — Telephone Encounter (Signed)
Called patient; left message to call back.

## 2012-03-04 ENCOUNTER — Ambulatory Visit: Payer: Medicare Other | Admitting: Internal Medicine

## 2012-03-05 ENCOUNTER — Ambulatory Visit (INDEPENDENT_AMBULATORY_CARE_PROVIDER_SITE_OTHER): Payer: Medicare Other | Admitting: Internal Medicine

## 2012-03-05 ENCOUNTER — Encounter: Payer: Self-pay | Admitting: Internal Medicine

## 2012-03-05 DIAGNOSIS — I10 Essential (primary) hypertension: Secondary | ICD-10-CM

## 2012-03-05 DIAGNOSIS — F419 Anxiety disorder, unspecified: Secondary | ICD-10-CM

## 2012-03-05 DIAGNOSIS — R32 Unspecified urinary incontinence: Secondary | ICD-10-CM | POA: Diagnosis not present

## 2012-03-05 DIAGNOSIS — IMO0001 Reserved for inherently not codable concepts without codable children: Secondary | ICD-10-CM | POA: Diagnosis not present

## 2012-03-05 DIAGNOSIS — F341 Dysthymic disorder: Secondary | ICD-10-CM

## 2012-03-05 DIAGNOSIS — R079 Chest pain, unspecified: Secondary | ICD-10-CM

## 2012-03-05 DIAGNOSIS — M797 Fibromyalgia: Secondary | ICD-10-CM

## 2012-03-05 MED ORDER — CLONAZEPAM 1 MG PO TABS: ORAL_TABLET | ORAL | Status: DC

## 2012-03-05 MED ORDER — TRAMADOL HCL 50 MG PO TABS: 50.0000 mg | ORAL_TABLET | Freq: Four times a day (QID) | ORAL | Status: DC | PRN

## 2012-03-05 NOTE — Assessment & Plan Note (Signed)
Today with worsenig bilat shoulder, upper chest and back pain - for tramadol prn,  to f/u any worsening symptoms or concerns

## 2012-03-05 NOTE — Patient Instructions (Signed)
Take all new medications as prescribed - the medication for nerves Continue all other medications as before Please go to LAB in the Basement for the blood and/or urine tests to be done today You will be contacted by phone if any changes need to be made immediately.  Otherwise, you will receive a letter about your results with an explanation. You will be contacted regarding the referral for: urology, and the stress test I faxed Dr Shelle Iron to say that you are holding off on the lower back surgury for now

## 2012-03-05 NOTE — Progress Notes (Signed)
Subjective:    Patient ID: Bonnie Clark, female    DOB: 02-13-1941, 71 y.o.   MRN: [id number]  HPI  Here to f/u;  Here to f/u; overall doing ok,  Pt denies, increased sob or doe, wheezing, orthopnea, PND, increased LE swelling, palpitations, dizziness or syncope, but has intemittent upper chest pain, as well as shoulder and back pain. .  Pt denies new neurological symptoms such as new headache, or facial or extremity weakness or numbness   Pt denies polydipsia, polyuria, or low sugar symptoms such as weakness or confusion improved with po intake.  Pt states overall fair compliance with meds, mostly trying to follow lower cholesterol, diabetic diet, wt overall stable but little exercise however, and wt cont's to increase.  Has ongoing back pain right lower with RLE radicular pain, she is simply afraid of the lumbar surgury , so she cancelled, and did not do the stress test.  Has hx of FMS, and has percocet but still with ant CP/bilat shoulder and upper back pain; better to lie on her orthopedic chair, but hard to sleep and often up at night, drives her husband crazy, even on current meds including the pain med prior to sleep.  Did sleep 4 hrs last night and felt that was a good night. She states her pain is myofascial, has hx of shoulder replacement- just doe not feel it to be orthopedic in that way.  Also with reurrent worsening overall urinary incontinene without dyrusia, freq , urgency, hematuria, not clear if related to lumbar issue, no fever.   Has freq wetting episodes as she cant move fast enough to get to the BR.  Does have power wheelchair at home. Past Medical History  Diagnosis Date  . Urine incontinence   . Hx: UTI (urinary tract infection)   . Arthritis   . Depression   . Diabetes mellitus   . Hyperlipidemia   . Hypertension   . Thyroid disease   . Obesity 09/11/2011  . Chronic insomnia 09/11/2011  . Spinal stenosis 09/11/2011  . Fibromyalgia 09/11/2011  . Anxiety and depression  09/11/2011  . Sciatica of right side 09/11/2011  . Gout 09/11/2011  . Gait disorder 12/21/2011   Past Surgical History  Procedure Date  . Thyroidectomy, partial     at 71yo  . Shoulder surgery     right - approx 2005  . Replacement total knee bilateral     right approx 1994, left approx 1999    reports that she has never smoked. She does not have any smokeless tobacco history on file. She reports that she does not drink alcohol or use illicit drugs. family history includes Arthritis in her father and mother and Depression in her mother. No Known Allergies Current Outpatient Prescriptions on File Prior to Visit  Medication Sig Dispense Refill  . allopurinol (ZYLOPRIM) 300 MG tablet Take 1 by mouth daily      . aspirin 81 MG tablet Take 81 mg by mouth daily.        [name] Kitchen atorvastatin (LIPITOR) 40 MG tablet Take 1 tablet (40 mg total) by mouth daily.  90 tablet  3  . colchicine 0.6 MG tablet Take 0.6 mg by mouth daily.        . DULoxetine (CYMBALTA) 60 MG capsule Take 1 capsule (60 mg total) by mouth daily. Take 1 by mouth daily  90 capsule  3  . furosemide (LASIX) 20 MG tablet Take 20 mg by mouth daily.        [name] Kitchen  glucose blood (ONE TOUCH ULTRA TEST) test strip 1 strip per day - Use as instructed  250.02  100 each  12  . ibuprofen (ADVIL) 200 MG tablet Take 400 mg by mouth 2 (two) times daily.        [name] Kitchen lisinopril (PRINIVIL,ZESTRIL) 5 MG tablet Take 1 by mouth daily      . metFORMIN (GLUCOPHAGE XR) 500 MG 24 hr tablet Take 1 tablet (500 mg total) by mouth daily with breakfast.  90 tablet  3  . clonazePAM (KLONOPIN) 1 MG tablet Take 1/2 to 1 tab by mouth up to three times per day as needed for anxiety and sleep  90 tablet  2   Review of Systems Review of Systems  Constitutional: Negative for diaphoresis and unexpected weight change.  Respiratory: Negative for choking and stridor.   Gastrointestinal: Negative for vomiting and blood in stool.  Genitourinary: Negative for hematuria and  decreased urine volume.  Musculoskeletal: Negative for gait problem.  Skin: Negative for color change and wound.  Objective:   Physical Exam BP 118/78  Pulse 85  Temp(Src) 98.3 F (36.8 C) (Oral)  Ht 5' 7.5" (1.715 m)  Wt 333 lb (151.048 kg)  BMI 51.39 kg/m2  SpO2 98% Physical Exam  VS noted, pt in wheelchair Constitutional: Pt appears well-developed and well-nourished.  HENT: Head: Normocephalic.  Right Ear: External ear normal.  Left Ear: External ear normal.  Eyes: Conjunctivae and EOM are normal. Pupils are equal, round, and reactive to light.  Neck: Normal range of motion. Neck supple.  Cardiovascular: Normal rate and regular rhythm.   Pulmonary/Chest: Effort normal and breath sounds normal.  Abd:  Soft, NT, non-distended, + BS Neurological: Pt is alert. No cranial nerve deficit. o/w not done in detail Skin: Skin is warm. No erythema.  Psychiatric: Pt behavior is normal. Thought content normal. but 2-3+ nervous, verbose, near panic    Assessment & Plan:

## 2012-03-06 ENCOUNTER — Encounter: Payer: Self-pay | Admitting: Internal Medicine

## 2012-03-06 NOTE — Assessment & Plan Note (Signed)
Ongoing mod to severe  - for klonopin asd,  to f/u any worsening symptoms or concerns

## 2012-03-06 NOTE — Assessment & Plan Note (Signed)
stable overall by hx and exam, most recent data reviewed with pt, and pt to continue medical treatment as before  BP Readings from Last 3 Encounters:  03/05/12 118/78  01/05/12 112/82  12/22/11 104/70

## 2012-03-06 NOTE — Assessment & Plan Note (Signed)
?   Control - for aqc today o/w stable overall by hx and exam, most recent data reviewed with pt, and pt to continue medical treatment as before Lab Results  Component Value Date   HGBA1C 7.5* 12/22/2011

## 2012-03-06 NOTE — Assessment & Plan Note (Addendum)
Pt near panic today, ecg not done per pt, but will consider stress test and I have re-ordered,  to f/u any worsening symptoms or concerns, Continue all other medications as before \ Note: time for pt history, exam, review of records with pt in room, determination of diagnosis, and plan for further eval and tx is > 40 min

## 2012-03-06 NOTE — Assessment & Plan Note (Signed)
Mild , for urine studies  to f/u any worsening symptoms or concerns, consider urology referral

## 2012-03-17 ENCOUNTER — Encounter: Payer: Self-pay | Admitting: Cardiology

## 2012-03-17 ENCOUNTER — Ambulatory Visit (HOSPITAL_COMMUNITY): Payer: Medicare Other | Attending: Cardiovascular Disease | Admitting: Radiology

## 2012-03-17 DIAGNOSIS — E785 Hyperlipidemia, unspecified: Secondary | ICD-10-CM | POA: Diagnosis not present

## 2012-03-17 DIAGNOSIS — R5381 Other malaise: Secondary | ICD-10-CM | POA: Insufficient documentation

## 2012-03-17 DIAGNOSIS — R9431 Abnormal electrocardiogram [ECG] [EKG]: Secondary | ICD-10-CM | POA: Diagnosis not present

## 2012-03-17 DIAGNOSIS — E669 Obesity, unspecified: Secondary | ICD-10-CM | POA: Diagnosis not present

## 2012-03-17 DIAGNOSIS — F411 Generalized anxiety disorder: Secondary | ICD-10-CM | POA: Diagnosis not present

## 2012-03-17 DIAGNOSIS — IMO0001 Reserved for inherently not codable concepts without codable children: Secondary | ICD-10-CM | POA: Insufficient documentation

## 2012-03-17 DIAGNOSIS — R079 Chest pain, unspecified: Secondary | ICD-10-CM | POA: Insufficient documentation

## 2012-03-17 DIAGNOSIS — I10 Essential (primary) hypertension: Secondary | ICD-10-CM | POA: Diagnosis not present

## 2012-03-17 DIAGNOSIS — E119 Type 2 diabetes mellitus without complications: Secondary | ICD-10-CM | POA: Insufficient documentation

## 2012-03-17 DIAGNOSIS — R5383 Other fatigue: Secondary | ICD-10-CM | POA: Insufficient documentation

## 2012-03-17 MED ORDER — REGADENOSON 0.4 MG/5ML IV SOLN
0.4000 mg | Freq: Once | INTRAVENOUS | Status: AC
  Administered 2012-03-17: 0.4 mg via INTRAVENOUS

## 2012-03-17 MED ORDER — TECHNETIUM TC 99M TETROFOSMIN IV KIT
30.0000 | PACK | Freq: Once | INTRAVENOUS | Status: AC | PRN
  Administered 2012-03-17: 30 via INTRAVENOUS

## 2012-03-17 NOTE — Progress Notes (Signed)
[facility] SITE 3 NUCLEAR MED [address] [place] [address] [phone]  Cardiology Nuclear Med Study  Bonnie Clark is a 71 y.o. female     MRN : [id number]     DOB: [birth year]  Procedure Date: 03/17/2012  Nuclear Med Background Indication for Stress Test:  Evaluation for Ischemia History:  Anxiety, Fibromyalgia Cardiac Risk Factors: Hypertension, Lipids, NIDDM and Obesity  Symptoms:  Chest Pain and Fatigue   Nuclear Pre-Procedure Caffeine/Decaff Intake:  None NPO After: 9:00pm   Lungs:  clear O2 Sat: 96% on room air. IV 0.9% NS with Angio Cath:  22g  IV Site: R Hand  IV Started by:  Stanton Kidney, EMT-P  Chest Size (in):  48 Cup Size: D  Height: 5\' 7"  (1.702 m)  Weight:  333 lb (151.048 kg)  BMI:  Body mass index is 52.16 kg/(m^2). Tech Comments:  NA    Nuclear Med Study 1 or 2 day study: 2 day  Stress Test Type:  Lexiscan  Reading MD: Charlton Haws, MD  Order Authorizing Provider:  Oliver Barre MD  Resting Radionuclide: Technetium 42m Tetrofosmin  Resting Radionuclide Dose: 33.0 mCi 03/18/12  Stress Radionuclide:  Technetium 21m Tetrofosmin  Stress Radionuclide Dose: 33.0 mCi 03/17/12          Stress Protocol Rest HR: 88 Stress HR: 96  Rest BP: 139/85 Stress BP: 127/92  Exercise Time (min): n/a METS: n/a   Predicted Max HR: 150 bpm % Max HR: 64 bpm Rate Pressure Product: 21308   Dose of Adenosine (mg):  n/a Dose of Lexiscan: 0.4 mg  Dose of Atropine (mg): n/a Dose of Dobutamine: n/a mcg/kg/min (at max HR)  Stress Test Technologist: Milana Na, EMT-P  Nuclear Technologist:  Domenic Polite, CNMT     Rest Procedure:  Myocardial perfusion imaging was performed at rest 45 minutes following the intravenous administration of Technetium 25m Tetrofosmin. Rest ECG: Atrial Fibrilliation  Stress Procedure:  The patient received IV Lexiscan 0.4 mg over 15-seconds.  Technetium 88m Tetrofosmin injected at 30-seconds.  No symptoms with Lexiscan. There  were no significant changes and occ pvcs with Lexiscan.  Quantitative spect images were obtained after a 45 minute delay. Stress ECG: No significant change from baseline ECG  QPS Raw Data Images:  There is interference from nuclear activity from structures below the diaphragm. This does not affect the ability to read the study. Stress Images:  Normal homogeneous uptake in all areas of the myocardium.  There is significant uptake in the RV. Rest Images:  Normal homogeneous uptake in all areas of the myocardium. Subtraction (SDS):  No evidence of ischemia. Transient Ischemic Dilatation (Normal <1.22):  0.82 Lung/Heart Ratio (Normal <0.45):  0.42   Quantitative Gated Spect Images QGS EDV:  n/a ml QGS ESV:  n/a ml  Impression Exercise Capacity:  Lexiscan with no exercise. BP Response:  Normal blood pressure response. Clinical Symptoms:  No significant symptoms noted. ECG Impression:  No significant ST segment change suggestive of ischemia. Comparison with Prior Nuclear Study: No images to compare  Overall Impression:  Low risk stress nuclear study.  There is no evidence of ischemia.  The study was not gated due to the atrial fibrillation.  LV Ejection Fraction: Study not gated.      Vesta Mixer, Montez Hageman., MD, [facility] 03/18/2012, 5:34 PM

## 2012-03-18 ENCOUNTER — Ambulatory Visit (HOSPITAL_COMMUNITY): Payer: Medicare Other | Attending: Cardiovascular Disease

## 2012-03-18 DIAGNOSIS — R0989 Other specified symptoms and signs involving the circulatory and respiratory systems: Secondary | ICD-10-CM

## 2012-03-18 MED ORDER — TECHNETIUM TC 99M TETROFOSMIN IV KIT
33.0000 | PACK | Freq: Once | INTRAVENOUS | Status: AC | PRN
  Administered 2012-03-18: 33 via INTRAVENOUS

## 2012-03-19 ENCOUNTER — Encounter: Payer: Self-pay | Admitting: Internal Medicine

## 2012-03-19 ENCOUNTER — Encounter: Payer: Self-pay | Admitting: Cardiology

## 2012-03-19 ENCOUNTER — Ambulatory Visit (INDEPENDENT_AMBULATORY_CARE_PROVIDER_SITE_OTHER): Payer: Medicare Other | Admitting: Cardiology

## 2012-03-19 VITALS — BP 130/78 | HR 74 | Wt 333.0 lb

## 2012-03-19 DIAGNOSIS — I10 Essential (primary) hypertension: Secondary | ICD-10-CM | POA: Diagnosis not present

## 2012-03-19 DIAGNOSIS — I4891 Unspecified atrial fibrillation: Secondary | ICD-10-CM | POA: Diagnosis not present

## 2012-03-19 DIAGNOSIS — E785 Hyperlipidemia, unspecified: Secondary | ICD-10-CM | POA: Diagnosis not present

## 2012-03-19 MED ORDER — DABIGATRAN ETEXILATE MESYLATE 150 MG PO CAPS: 150.0000 mg | ORAL_CAPSULE | Freq: Two times a day (BID) | ORAL | Status: DC

## 2012-03-19 NOTE — Progress Notes (Signed)
HPI: 70 year-old female for evaluation of atrial fibrillation. Patient had a Myoview on 03/17/2012 for risk stratification. There was no ischemia. However the patient was noted to be in atrial fibrillation which was a new diagnosis. We were therefore asked to evaluate. Patient denies dyspnea, chest pain, palpitations or syncope. She occasionally has mild pedal edema. She is unaware of her atrial fibrillation. Note she has significant back discomfort and difficulties ambulating. She has fallen 4 times in the past 2 years. These typically happen at night when she loses her balance.  Current Outpatient Prescriptions  Medication Sig Dispense Refill  . allopurinol (ZYLOPRIM) 300 MG tablet Take 1 by mouth daily      . atorvastatin (LIPITOR) 40 MG tablet Take 1 tablet (40 mg total) by mouth daily.  90 tablet  3  . clonazePAM (KLONOPIN) 1 MG tablet Take 1/2 to 1 tab by mouth up to three times per day as needed for anxiety and sleep  90 tablet  2  . colchicine 0.6 MG tablet Take 0.6 mg by mouth daily.        . DULoxetine (CYMBALTA) 60 MG capsule Take 1 capsule (60 mg total) by mouth daily. Take 1 by mouth daily  90 capsule  3  . furosemide (LASIX) 20 MG tablet Take 20 mg by mouth daily.        [name] Kitchen glucose blood (ONE TOUCH ULTRA TEST) test strip 1 strip per day - Use as instructed  250.02  100 each  12  . ibuprofen (ADVIL) 200 MG tablet Take 400 mg by mouth as needed.       [name] Kitchen lisinopril (PRINIVIL,ZESTRIL) 5 MG tablet Take 1 by mouth daily      . metFORMIN (GLUCOPHAGE XR) 500 MG 24 hr tablet Take 1 tablet (500 mg total) by mouth daily with breakfast.  90 tablet  3  . metoprolol (LOPRESSOR) 100 MG tablet Take 1 tablet by mouth BID times 48H.      [name] Kitchen oxyCODONE-acetaminophen (PERCOCET) 10-325 MG per tablet as needed.      . zolpidem (AMBIEN) 5 MG tablet Take 1 tablet by mouth Daily.      . dabigatran (PRADAXA) 150 MG CAPS Take 1 capsule (150 mg total) by mouth every 12 (twelve) hours.  60 capsule  12  . zolpidem  (AMBIEN CR) 6.25 MG CR tablet Take 1 tablet (6.25 mg total) by mouth at bedtime as needed for sleep.  30 tablet  5    No Known Allergies  Past Medical History  Diagnosis Date  . Urine incontinence   . Arthritis   . Diabetes mellitus   . Hyperlipidemia   . Hypertension   . Thyroid disease   . Obesity 09/11/2011  . Chronic insomnia 09/11/2011  . Spinal stenosis 09/11/2011  . Fibromyalgia 09/11/2011  . Anxiety and depression 09/11/2011  . Sciatica of right side 09/11/2011  . Gout 09/11/2011  . Gait disorder 12/21/2011    Past Surgical History  Procedure Date  . Thyroidectomy, partial     at 71yo  . Shoulder surgery     right - approx 2005  . Replacement total knee bilateral     right approx 1994, left approx 1999    History   Social History  . Marital Status: Married    Spouse Name: N/A    Number of Children: 3  . Years of Education: N/A   Occupational History  . Not on file.   Social History Main Topics  . Smoking status:  Never Smoker   . Smokeless tobacco: Not on file  . Alcohol Use: No  . Drug Use: No  . Sexually Active: Not on file   Other Topics Concern  . Not on file   Social History Narrative  . No narrative on file    Family History  Problem Relation Age of Onset  . Arthritis Mother   . Depression Mother   . Arthritis Father   . Stroke Sister     ROS: Significant back and shoulder pain. Problems with leg weakness and pain. no fevers or chills, productive cough, hemoptysis, dysphasia, odynophagia, melena, hematochezia, dysuria, hematuria, rash, seizure activity, orthopnea, PND, claudication. Remaining systems are negative.  Physical Exam:   Blood pressure 130/78, pulse 74, weight 151.048 kg (333 lb).  General:  Well developed/morbidly obese in NAD Skin warm/dry Patient not depressed No peripheral clubbing Back-normal HEENT-normal/normal eyelids Neck supple/normal carotid upstroke bilaterally; no bruits; no JVD; no thyromegaly chest -  CTA/ normal expansion CV - irregular/normal S1 and S2; no murmurs, rubs or gallops;  PMI nondisplaced Abdomen -Difficult due to obesity, NT/ND, no HSM, no mass, + bowel sounds, no bruit Femoral pulses not palpated Ext-trace edema, no chords, 2+ DP Neuro-grossly nonfocal  ECG 03/17/2012-atrial fibrillation with PVCs or aberrantly conducted beats. Low voltage. Cannot rule out prior anterior infarct. Nonspecific ST changes. Today's electrocardiogram shows atrial fibrillation at a rate of 79. Axis normal. Low voltage. Cannot rule out prior septal infarct. Nonspecific ST changes.

## 2012-03-19 NOTE — Assessment & Plan Note (Signed)
Management per primary care. 

## 2012-03-19 NOTE — Assessment & Plan Note (Signed)
The patient presents for evaluation of newly diagnosed atrial fibrillation. Note she is asymptomatic. Her rate is controlled and we will continue with her present dose of metoprolol. Schedule echocardiogram and TSH. We had extensive discussions about treatment. Our options include attempts at cardioversion with rhythm control versus rate control. Given that she is having no symptoms and we do not know the duration of her atrial fibrillation I feel rate control and anti-coagulation is most appropriate. We will schedule a 48-hour Holter monitor to make sure that her rate is adequately controlled. We had long discussions concerning anticoagulation. She does have increased risk for embolic event. She has risk factors of diabetes mellitus, hypertension, female sex and age greater than 41. I explained the need for anticoagulation. It was noted that she has fallen 4 times in the past 2 years. This typically is related to leg weakness/back pain. It occurs at night in the dark when she cannot see per her husband's report. I explained the risk of CVA versus bleed if she falls. They have elected to proceed with anti-coagulation. We will discontinue aspirin and begin pradaxa 150 mg BID. Note her renal function was normal in January of 2013. Recheck renal function and hemoglobin 2 weeks after initiating this medication. They have stated they will increase lighting at night and she will use her walker as much as possible. They understand the risk of bleeding if she falls. I will see her back in 4-6 weeks to make sure she is tolerating.

## 2012-03-19 NOTE — Patient Instructions (Signed)
Your physician recommends that you schedule a follow-up appointment in: 4-6 WEEKS IN [place]  Your physician has requested that you have an echocardiogram. Echocardiography is a painless test that uses sound waves to create images of your heart. It provides your doctor with information about the size and shape of your heart and how well your heart's chambers and valves are working. This procedure takes approximately one hour. There are no restrictions for this procedure.   Your physician has recommended that you wear a 48 HOUR holter monitor. Holter monitors are medical devices that record the heart's electrical activity. Doctors most often use these monitors to diagnose arrhythmias. Arrhythmias are problems with the speed or rhythm of the heartbeat. The monitor is a small, portable device. You can wear one while you do your normal daily activities. This is usually used to diagnose what is causing palpitations/syncope (passing out).    STOP ASPIRIN  START PRADAXA 150 MG ONE TABLET TWICE DAILY  HAVE LABS WITH DR Jonny Ruiz DRAWN IN 2 WEEKS

## 2012-03-19 NOTE — Assessment & Plan Note (Signed)
Blood pressure controlled. Continue present medications. 

## 2012-03-22 ENCOUNTER — Other Ambulatory Visit: Payer: Self-pay | Admitting: *Deleted

## 2012-03-22 DIAGNOSIS — I4891 Unspecified atrial fibrillation: Secondary | ICD-10-CM

## 2012-03-22 MED ORDER — DABIGATRAN ETEXILATE MESYLATE 150 MG PO CAPS: 150.0000 mg | ORAL_CAPSULE | Freq: Two times a day (BID) | ORAL | Status: DC

## 2012-03-25 ENCOUNTER — Telehealth: Payer: Self-pay | Admitting: Cardiology

## 2012-03-25 NOTE — Telephone Encounter (Signed)
New Problem:     Called needing clearance about several of the medications that the patient is on.  Please call back.

## 2012-03-26 ENCOUNTER — Telehealth: Payer: Self-pay | Admitting: Cardiology

## 2012-03-26 NOTE — Telephone Encounter (Signed)
[facility] needs to give you info an the  Appeal you sent in for pt

## 2012-03-30 NOTE — Telephone Encounter (Signed)
Follow-up:    Returned your call. please call back.

## 2012-03-30 NOTE — Telephone Encounter (Signed)
Spoke with Bonnie Clark, pt pradaxa has been approved

## 2012-04-06 ENCOUNTER — Ambulatory Visit (HOSPITAL_COMMUNITY): Payer: Medicare Other | Attending: Cardiology

## 2012-04-06 ENCOUNTER — Other Ambulatory Visit: Payer: Self-pay

## 2012-04-06 ENCOUNTER — Encounter (INDEPENDENT_AMBULATORY_CARE_PROVIDER_SITE_OTHER): Payer: Medicare Other

## 2012-04-06 ENCOUNTER — Ambulatory Visit (INDEPENDENT_AMBULATORY_CARE_PROVIDER_SITE_OTHER): Payer: Medicare Other | Admitting: *Deleted

## 2012-04-06 DIAGNOSIS — I10 Essential (primary) hypertension: Secondary | ICD-10-CM | POA: Insufficient documentation

## 2012-04-06 DIAGNOSIS — I059 Rheumatic mitral valve disease, unspecified: Secondary | ICD-10-CM | POA: Diagnosis not present

## 2012-04-06 DIAGNOSIS — I4891 Unspecified atrial fibrillation: Secondary | ICD-10-CM | POA: Diagnosis not present

## 2012-04-06 DIAGNOSIS — E785 Hyperlipidemia, unspecified: Secondary | ICD-10-CM | POA: Diagnosis not present

## 2012-04-06 DIAGNOSIS — R32 Unspecified urinary incontinence: Secondary | ICD-10-CM | POA: Diagnosis not present

## 2012-04-06 DIAGNOSIS — IMO0001 Reserved for inherently not codable concepts without codable children: Secondary | ICD-10-CM

## 2012-04-06 DIAGNOSIS — E119 Type 2 diabetes mellitus without complications: Secondary | ICD-10-CM | POA: Diagnosis not present

## 2012-04-06 LAB — CBC WITH DIFFERENTIAL/PLATELET
Eosinophils Relative: 4.8 % (ref 0.0–5.0)
Lymphocytes Relative: 26.5 % (ref 12.0–46.0)
MCV: 98 fl (ref 78.0–100.0)
Monocytes Absolute: 0.6 10*3/uL (ref 0.1–1.0)
Neutrophils Relative %: 59.3 % (ref 43.0–77.0)
Platelets: 147 10*3/uL — ABNORMAL LOW (ref 150.0–400.0)
WBC: 7.2 10*3/uL (ref 4.5–10.5)

## 2012-04-06 LAB — BASIC METABOLIC PANEL
BUN: 21 mg/dL (ref 6–23)
Chloride: 103 mEq/L (ref 96–112)
GFR: 50.58 mL/min — ABNORMAL LOW (ref >60.00)
Glucose, Bld: 140 mg/dL — ABNORMAL HIGH (ref 70–99)
Potassium: 4.4 mEq/L (ref 3.5–5.1)

## 2012-04-06 LAB — HEPATIC FUNCTION PANEL
ALT: 16 U/L (ref 0–35)
Bilirubin, Direct: 0.1 mg/dL (ref 0.0–0.3)
Total Bilirubin: 0.5 mg/dL (ref 0.3–1.2)

## 2012-04-06 LAB — TSH: TSH: 2.87 u[IU]/mL (ref 0.35–5.50)

## 2012-04-06 LAB — URINALYSIS, ROUTINE W REFLEX MICROSCOPIC
Bilirubin Urine: NEGATIVE
Ketones, ur: NEGATIVE
Urobilinogen, UA: 0.2 (ref 0.0–1.0)

## 2012-04-06 LAB — LIPID PANEL
HDL: 42.6 mg/dL (ref >39.00)
LDL Cholesterol: 65 mg/dL (ref 0–99)
Total CHOL/HDL Ratio: 3
VLDL: 26.6 mg/dL (ref 0.0–40.0)

## 2012-04-06 LAB — HEMOGLOBIN A1C: Hgb A1c MFr Bld: 7.3 % — ABNORMAL HIGH (ref 4.6–6.5)

## 2012-04-07 ENCOUNTER — Telehealth: Payer: Self-pay | Admitting: Internal Medicine

## 2012-04-07 ENCOUNTER — Telehealth: Payer: Self-pay | Admitting: Cardiology

## 2012-04-07 ENCOUNTER — Encounter: Payer: Self-pay | Admitting: Internal Medicine

## 2012-04-07 MED ORDER — CEPHALEXIN 500 MG PO CAPS: 500.0000 mg | ORAL_CAPSULE | Freq: Four times a day (QID) | ORAL | Status: AC

## 2012-04-07 NOTE — Telephone Encounter (Signed)
Ok for cephalexin course - done per emr 

## 2012-04-07 NOTE — Telephone Encounter (Signed)
Message copied by Corwin Levins on Wed Apr 07, 2012  5:23 PM ------      Message from: Scharlene Gloss B      Created: Wed Apr 07, 2012  4:54 PM       Called the patient this afternoon and she is having symptoms, frequency at night.

## 2012-04-07 NOTE — Telephone Encounter (Signed)
Fu call °Pt returning your call  °

## 2012-04-07 NOTE — Telephone Encounter (Signed)
Spoke with pt, aware of echo results. Questions answered.

## 2012-04-08 NOTE — Telephone Encounter (Signed)
Patient informed. 

## 2012-04-09 ENCOUNTER — Encounter: Payer: Self-pay | Admitting: Internal Medicine

## 2012-04-09 LAB — URINE CULTURE: Colony Count: >=100000

## 2012-04-13 ENCOUNTER — Telehealth: Payer: Self-pay | Admitting: Cardiology

## 2012-04-13 ENCOUNTER — Telehealth: Payer: Self-pay | Admitting: *Deleted

## 2012-04-13 NOTE — Telephone Encounter (Signed)
Pt is having a great deal of stomach discomfort due to all the medication she is taking even pradaxa and they where wondering if they are counter acting causing this problem

## 2012-04-13 NOTE — Telephone Encounter (Signed)
Left message for pt, monitor reviewed by dr Jens Som shows atrial fib with controlled rate.

## 2012-04-13 NOTE — Telephone Encounter (Signed)
Spoke with pt husband, he reports the pt had pretty bad abdominal pain yesterday and for the last couple days has had indigestion type symptoms. The pradaxa was started on 03-19-12. Explained to pts husband that is the most common side effect of pradaxa. They do not wish to change meds at this time. Instructed pts husband to get zantac over the counter and she can take it up to two times daily is needed. They will try that and if she cont to have problems will call back. Dr Jens Som made aware.

## 2012-04-20 DIAGNOSIS — Z87440 Personal history of urinary (tract) infections: Secondary | ICD-10-CM | POA: Diagnosis not present

## 2012-04-20 DIAGNOSIS — N3020 Other chronic cystitis without hematuria: Secondary | ICD-10-CM | POA: Diagnosis not present

## 2012-04-20 DIAGNOSIS — N3946 Mixed incontinence: Secondary | ICD-10-CM | POA: Diagnosis not present

## 2012-04-21 ENCOUNTER — Encounter: Payer: Self-pay | Admitting: Cardiology

## 2012-04-21 ENCOUNTER — Ambulatory Visit (INDEPENDENT_AMBULATORY_CARE_PROVIDER_SITE_OTHER): Payer: Medicare Other | Admitting: Cardiology

## 2012-04-21 VITALS — BP 111/67 | HR 79 | Ht 68.0 in | Wt 333.0 lb

## 2012-04-21 DIAGNOSIS — I428 Other cardiomyopathies: Secondary | ICD-10-CM

## 2012-04-21 DIAGNOSIS — I10 Essential (primary) hypertension: Secondary | ICD-10-CM | POA: Diagnosis not present

## 2012-04-21 DIAGNOSIS — E785 Hyperlipidemia, unspecified: Secondary | ICD-10-CM | POA: Diagnosis not present

## 2012-04-21 DIAGNOSIS — I4891 Unspecified atrial fibrillation: Secondary | ICD-10-CM | POA: Diagnosis not present

## 2012-04-21 DIAGNOSIS — I429 Cardiomyopathy, unspecified: Secondary | ICD-10-CM | POA: Insufficient documentation

## 2012-04-21 NOTE — Assessment & Plan Note (Signed)
LV function is mildly reduced. Etiology unclear. Continue low-dose ACE inhibitor and beta blocker. Repeat echocardiograms in the future. Note Myoview not suggestive of coronary disease. TSH normal.

## 2012-04-21 NOTE — Assessment & Plan Note (Signed)
Patient remains in atrial fibrillation. Her rate is controlled. Continue metoprolol. Continue pradaxa. We have elected to manage her rhythm disturbance with rate control and anticoagulation as she is asymptomatic. Check CBC, potassium and renal function in 3 months.

## 2012-04-21 NOTE — Patient Instructions (Signed)
Your physician wants you to follow-up in: 6 MONTHS WITH DR Jens Som You will receive a reminder letter in the mail two months in advance. If you don't receive a letter, please call our office to schedule the follow-up appointment.   Your physician recommends that you return for lab work in: 3 MONTHS IN [place]

## 2012-04-21 NOTE — Assessment & Plan Note (Signed)
Continue statin. 

## 2012-04-21 NOTE — Progress Notes (Signed)
HPI: 71 year-old female I initially saw in April of 2013 for evaluation of atrial fibrillation. Patient had a Myoview on 03/17/2012 for risk stratification. There was no ischemia. However the patient was noted to be in atrial fibrillation which was a new diagnosis. Echocardiogram in may of 2013 showed an ejection fraction of 40-45%. There was mild left atrial enlargement and right atrial enlargement. There was mild mitral regurgitation. There was trace aortic insufficiency. Holter monitor in May of 2013 showed atrial fibrillation with rate controlled. TSH normal at 2.87. Patient started on pradaxa. Decision made for rate control and anticoagulation. Since I saw her previously, the patient denies any dyspnea, orthopnea, PND, pedal edema, palpitations, syncope or chest pain.    Current Outpatient Prescriptions  Medication Sig Dispense Refill  . allopurinol (ZYLOPRIM) 300 MG tablet Take 1 by mouth daily      . atorvastatin (LIPITOR) 40 MG tablet Take 1 tablet (40 mg total) by mouth daily.  90 tablet  3  . clonazePAM (KLONOPIN) 1 MG tablet Take 1/2 to 1 tab by mouth up to three times per day as needed for anxiety and sleep  90 tablet  2  . colchicine 0.6 MG tablet Take 0.6 mg by mouth daily.        . dabigatran (PRADAXA) 150 MG CAPS Take 1 capsule (150 mg total) by mouth every 12 (twelve) hours.  60 capsule  12  . DULoxetine (CYMBALTA) 60 MG capsule Take 1 capsule (60 mg total) by mouth daily. Take 1 by mouth daily  90 capsule  3  . furosemide (LASIX) 20 MG tablet Take 20 mg by mouth daily.        [name] Kitchen glucose blood (ONE TOUCH ULTRA TEST) test strip 1 strip per day - Use as instructed  250.02  100 each  12  . lisinopril (PRINIVIL,ZESTRIL) 5 MG tablet Take 1 by mouth daily      . metFORMIN (GLUCOPHAGE XR) 500 MG 24 hr tablet Take 1 tablet (500 mg total) by mouth daily with breakfast.  90 tablet  3  . metoprolol (LOPRESSOR) 100 MG tablet Take 1 tablet by mouth BID times 48H.      . ranitidine (ZANTAC)  150 MG capsule Take 150 mg by mouth as needed.      . zolpidem (AMBIEN) 5 MG tablet Take 1 tablet by mouth as needed.       . zolpidem (AMBIEN CR) 6.25 MG CR tablet Take 1 tablet (6.25 mg total) by mouth at bedtime as needed for sleep.  30 tablet  5     Past Medical History  Diagnosis Date  . Urine incontinence   . Arthritis   . Diabetes mellitus   . Hyperlipidemia   . Hypertension   . Thyroid disease   . Obesity 09/11/2011  . Chronic insomnia 09/11/2011  . Spinal stenosis 09/11/2011  . Fibromyalgia 09/11/2011  . Anxiety and depression 09/11/2011  . Sciatica of right side 09/11/2011  . Gout 09/11/2011  . Gait disorder 12/21/2011  . Atrial fibrillation     Past Surgical History  Procedure Date  . Thyroidectomy, partial     at 71yo  . Shoulder surgery     right - approx 2005  . Replacement total knee bilateral     right approx 1994, left approx 1999    History   Social History  . Marital Status: Married    Spouse Name: N/A    Number of Children: 3  . Years of Education:  N/A   Occupational History  . Not on file.   Social History Main Topics  . Smoking status: Never Smoker   . Smokeless tobacco: Not on file  . Alcohol Use: No  . Drug Use: No  . Sexually Active: Not on file   Other Topics Concern  . Not on file   Social History Narrative  . No narrative on file    ROS: no fevers or chills, productive cough, hemoptysis, dysphasia, odynophagia, melena, hematochezia, dysuria, hematuria, rash, seizure activity, orthopnea, PND, pedal edema, claudication. Remaining systems are negative.  Physical Exam: Well-developed morbidly obese in no acute distress.  Skin is warm and dry.  HEENT is normal.  Neck is supple.   Chest is clear to auscultation with normal expansion.  Cardiovascular exam is irregular Abdominal exam nontender or distended. No masses palpated. Difficult due to obesity Extremities show trace edema. neuro grossly intact   ECG shows atrial  fibrillation at a rate of 79. Occasional PVC or aberrantly conducted beat. Right axis deviation. Low voltage. Nonspecific ST changes.

## 2012-04-21 NOTE — Assessment & Plan Note (Signed)
Blood pressure controlled. Continue present medications. 

## 2012-05-20 ENCOUNTER — Ambulatory Visit: Payer: Medicare Other | Admitting: Internal Medicine

## 2012-05-20 DIAGNOSIS — Z0289 Encounter for other administrative examinations: Secondary | ICD-10-CM

## 2012-06-09 ENCOUNTER — Other Ambulatory Visit: Payer: Self-pay | Admitting: Internal Medicine

## 2012-06-10 NOTE — Telephone Encounter (Signed)
Faxed hardcopy to pharmacy. 

## 2012-06-10 NOTE — Telephone Encounter (Signed)
Done hardcopy to robin  

## 2012-06-17 DIAGNOSIS — N3946 Mixed incontinence: Secondary | ICD-10-CM | POA: Diagnosis not present

## 2012-06-17 DIAGNOSIS — N3020 Other chronic cystitis without hematuria: Secondary | ICD-10-CM | POA: Diagnosis not present

## 2012-06-21 ENCOUNTER — Encounter: Payer: Self-pay | Admitting: Internal Medicine

## 2012-06-21 ENCOUNTER — Ambulatory Visit (INDEPENDENT_AMBULATORY_CARE_PROVIDER_SITE_OTHER): Payer: Medicare Other | Admitting: Internal Medicine

## 2012-06-21 VITALS — BP 120/80 | HR 92 | Temp 97.0°F | Wt 333.0 lb

## 2012-06-21 DIAGNOSIS — R609 Edema, unspecified: Secondary | ICD-10-CM

## 2012-06-21 DIAGNOSIS — N3281 Overactive bladder: Secondary | ICD-10-CM | POA: Insufficient documentation

## 2012-06-21 DIAGNOSIS — G4710 Hypersomnia, unspecified: Secondary | ICD-10-CM | POA: Diagnosis not present

## 2012-06-21 DIAGNOSIS — R600 Localized edema: Secondary | ICD-10-CM

## 2012-06-21 DIAGNOSIS — N318 Other neuromuscular dysfunction of bladder: Secondary | ICD-10-CM

## 2012-06-21 HISTORY — DX: Localized edema: R60.0

## 2012-06-21 HISTORY — DX: Overactive bladder: N32.81

## 2012-06-21 HISTORY — DX: Edema, unspecified: R60.9

## 2012-06-21 MED ORDER — FUROSEMIDE 40 MG PO TABS: 40.0000 mg | ORAL_TABLET | Freq: Every day | ORAL | Status: AC

## 2012-06-21 NOTE — Progress Notes (Signed)
Subjective:    Patient ID: Bonnie Clark, female    DOB: 1941/03/25, 71 y.o.   MRN: [id number]  HPI  Here to f/u; overall doing ok,  Pt denies chest pain, increased sob or doe, wheezing, orthopnea, PND,  palpitations, dizziness or syncope but has had persistent LE edema since about the time of starting the pradaxa, better these days without the severe redness she had to start, overall about 50% better.  Pt denies new neurological symptoms such as new headache, or facial or extremity weakness or numbness   Pt denies polydipsia, polyuria, or low sugar symptoms such as weakness or confusion improved with po intake.  Pt states overall good compliance with meds, trying to follow lower cholesterol, diabetic diet. Does have sense of ongoing fatigue, and has  signficant daytime hypersomnolence.  Did see cardiology and urology, now on pradaxa , states thinks may be making her reflux worse.  Denies dysphagia, abd pain, n/v, bowel change or blood.  Still present despite zantac, but taking with a piece of melon seems to help.  May 2013 tx for UTi - no recurrence. Is still decideing on back surgury per DrBeane/ortho, not sure about the surgury after pt states she was told she might get only up to 50% better overall.  Past Medical History  Diagnosis Date  . Urine incontinence   . Arthritis   . Diabetes mellitus   . Hyperlipidemia   . Hypertension   . Thyroid disease   . Obesity 09/11/2011  . Chronic insomnia 09/11/2011  . Spinal stenosis 09/11/2011  . Fibromyalgia 09/11/2011  . Anxiety and depression 09/11/2011  . Sciatica of right side 09/11/2011  . Gout 09/11/2011  . Gait disorder 12/21/2011  . Atrial fibrillation    Past Surgical History  Procedure Date  . Thyroidectomy, partial     at 71yo  . Shoulder surgery     right - approx 2005  . Replacement total knee bilateral     right approx 1994, left approx 1999    reports that she has never smoked. She does not have any smokeless tobacco history on  file. She reports that she does not drink alcohol or use illicit drugs. family history includes Arthritis in her father and mother; Depression in her mother; and Stroke in her sister. No Known Allergies Current Outpatient Prescriptions on File Prior to Visit  Medication Sig Dispense Refill  . allopurinol (ZYLOPRIM) 300 MG tablet Take 1 by mouth daily      . atorvastatin (LIPITOR) 40 MG tablet Take 1 tablet (40 mg total) by mouth daily.  90 tablet  3  . clonazePAM (KLONOPIN) 1 MG tablet Take 1/2 to 1 tab by mouth up to three times per day as needed for anxiety and sleep  90 tablet  2  . colchicine 0.6 MG tablet Take 0.6 mg by mouth daily.        . dabigatran (PRADAXA) 150 MG CAPS Take 1 capsule (150 mg total) by mouth every 12 (twelve) hours.  60 capsule  12  . DULoxetine (CYMBALTA) 60 MG capsule Take 1 capsule (60 mg total) by mouth daily. Take 1 by mouth daily  90 capsule  3  . furosemide (LASIX) 20 MG tablet Take 20 mg by mouth daily.        [name] Kitchen glucose blood (ONE TOUCH ULTRA TEST) test strip 1 strip per day - Use as instructed  250.02  100 each  12  . lisinopril (PRINIVIL,ZESTRIL) 5 MG tablet Take 1 by mouth  daily      . metFORMIN (GLUCOPHAGE XR) 500 MG 24 hr tablet Take 1 tablet (500 mg total) by mouth daily with breakfast.  90 tablet  3  . metoprolol (LOPRESSOR) 100 MG tablet Take 1 tablet by mouth BID times 48H.      . ranitidine (ZANTAC) 150 MG capsule Take 150 mg by mouth as needed.      . zolpidem (AMBIEN) 5 MG tablet TAKE ONE TABLET BY MOUTH EVERY DAY AT BEDTIME AS NEEDED FOR SLEEP  30 tablet  5  . zolpidem (AMBIEN CR) 6.25 MG CR tablet Take 1 tablet (6.25 mg total) by mouth at bedtime as needed for sleep.  30 tablet  5   Review of Systems Review of Systems  Constitutional: Negative for diaphoresis and unexpected weight change.  HENT: Negative for drooling and tinnitus.   Eyes: Negative for photophobia and visual disturbance.  Respiratory: Negative for choking and stridor.     Gastrointestinal: Negative for vomiting and blood in stool.  Genitourinary: Negative for hematuria and decreased urine volume.  Musculoskeletal: Negative for gait problem.  Skin: Negative for color change and wound.  Neurological: Negative for tremors and numbness.  Objective:   Physical Exam BP 120/80  Pulse 92  Temp 97 F (36.1 C) (Oral)  Wt 333 lb (151.048 kg)  SpO2 93% Physical Exam  VS noted Constitutional: Pt appears well-developed and well-nourished/obese.  HENT: Head: Normocephalic.  Right Ear: External ear normal.  Left Ear: External ear normal.  Eyes: Conjunctivae and EOM are normal. Pupils are equal, round, and reactive to light.  Neck: Normal range of motion. Neck supple.  Cardiovascular: Normal rate and regular rhythm.   Pulmonary/Chest: Effort normal and breath sounds normal.  Abd:  Soft, NT, non-distended, + BS Neurological: Pt is alert. Still with RLE radiculopathy Skin: Skin is warm. No erythema.  Psychiatric: Pt behavior is normal. Thought content normal. 1-2+ nervous    Assessment & Plan:

## 2012-06-21 NOTE — Assessment & Plan Note (Signed)
stable overall by hx and exam, most recent data reviewed with pt, and pt to continue medical treatment as before Lab Results  Component Value Date   HGBA1C 7.3* 04/06/2012

## 2012-06-21 NOTE — Assessment & Plan Note (Signed)
Has known cardiomyopathy - to incr the lasix to 40 mg ,  to f/u any worsening symptoms or concerns

## 2012-06-21 NOTE — Patient Instructions (Addendum)
Please increase the lasix to 40 mg per day OK to take the bladder medication per urology Please call in 3-4 weeks if you would like the referral to pulmonary for possible sleep apnea Continue all other medications as before No ned for further blood work today Please return in 6 months, or sooner if needed

## 2012-06-21 NOTE — Assessment & Plan Note (Signed)
Very likely OSA, but declines pulm at this time for eval

## 2012-06-21 NOTE — Assessment & Plan Note (Signed)
Has recently started OAB med per urology, but cant remember name, has f/u appt

## 2012-06-28 ENCOUNTER — Other Ambulatory Visit: Payer: Self-pay

## 2012-06-28 MED ORDER — METOPROLOL TARTRATE 100 MG PO TABS: 100.0000 mg | ORAL_TABLET | Freq: Two times a day (BID) | ORAL | Status: DC

## 2012-06-28 MED ORDER — LISINOPRIL 5 MG PO TABS: 5.0000 mg | ORAL_TABLET | Freq: Every day | ORAL | Status: DC

## 2012-07-23 DIAGNOSIS — N3946 Mixed incontinence: Secondary | ICD-10-CM | POA: Diagnosis not present

## 2012-07-23 DIAGNOSIS — N3020 Other chronic cystitis without hematuria: Secondary | ICD-10-CM | POA: Diagnosis not present

## 2012-08-01 ENCOUNTER — Telehealth: Payer: Self-pay | Admitting: Internal Medicine

## 2012-08-01 NOTE — Telephone Encounter (Signed)
Received letter from husband detailing need for hospital bed  rx done, to be sent with 2013 office notes to [facility] - to [place]

## 2012-08-02 NOTE — Telephone Encounter (Signed)
Faxed letter, office notes and rx to [facility] per MD request.

## 2012-08-16 ENCOUNTER — Other Ambulatory Visit: Payer: Self-pay

## 2012-08-16 DIAGNOSIS — I4891 Unspecified atrial fibrillation: Secondary | ICD-10-CM | POA: Diagnosis not present

## 2012-08-16 MED ORDER — COLCHICINE 0.6 MG PO TABS: 0.6000 mg | ORAL_TABLET | Freq: Every day | ORAL | Status: DC

## 2012-08-17 LAB — BASIC METABOLIC PANEL WITH GFR
Chloride: 103 mEq/L (ref 96–112)
GFR, Est African American: 66 mL/min
Glucose, Bld: 123 mg/dL — ABNORMAL HIGH (ref 70–99)
Potassium: 4.4 mEq/L (ref 3.5–5.3)
Sodium: 135 mEq/L (ref 135–145)

## 2012-08-17 LAB — CBC
Hemoglobin: 14.7 g/dL (ref 12.0–15.0)
MCHC: 32.7 g/dL (ref 30.0–36.0)
RBC: 4.62 MIL/uL (ref 3.87–5.11)
WBC: 7.6 10*3/uL (ref 4.0–10.5)

## 2012-09-03 ENCOUNTER — Telehealth: Payer: Self-pay | Admitting: Internal Medicine

## 2012-09-03 MED ORDER — ALLOPURINOL 300 MG PO TABS: 300.0000 mg | ORAL_TABLET | Freq: Every day | ORAL | Status: DC

## 2012-09-03 NOTE — Telephone Encounter (Signed)
Too soon it seems  Last rx was for 6 mo total med, prescribed June 10, 2012

## 2012-09-03 NOTE — Telephone Encounter (Signed)
Sorry I cannot as this is a controlled substance, and increased use is supposed to not be allowed and the 5 mg Remus Loffler is the highest recommended dose per the FDA for person her age

## 2012-09-03 NOTE — Telephone Encounter (Signed)
Called the patients husband and he was requesting the med.be refilled early before 09/10/12 due date.  He stated she is having a lot of pain and not sleeping well.

## 2012-09-03 NOTE — Telephone Encounter (Signed)
The patient's husband called the triage line and is hoping to get her a refill on generic Ambien 5mg .  He states he is aware it is early, but does not know why the rx has not lasted until the refill date.   His callback number - [phone]

## 2012-09-06 NOTE — Telephone Encounter (Signed)
Patients husband informed of MD instructions on medication refill.

## 2012-09-06 NOTE — Telephone Encounter (Signed)
Called left message to call back 

## 2012-10-05 ENCOUNTER — Other Ambulatory Visit: Payer: Self-pay | Admitting: Internal Medicine

## 2012-10-06 ENCOUNTER — Encounter: Payer: Self-pay | Admitting: Cardiology

## 2012-10-06 ENCOUNTER — Ambulatory Visit (INDEPENDENT_AMBULATORY_CARE_PROVIDER_SITE_OTHER): Payer: Medicare Other | Admitting: Cardiology

## 2012-10-06 VITALS — BP 118/81 | HR 71

## 2012-10-06 DIAGNOSIS — I428 Other cardiomyopathies: Secondary | ICD-10-CM

## 2012-10-06 DIAGNOSIS — I4891 Unspecified atrial fibrillation: Secondary | ICD-10-CM

## 2012-10-06 DIAGNOSIS — I10 Essential (primary) hypertension: Secondary | ICD-10-CM

## 2012-10-06 DIAGNOSIS — E785 Hyperlipidemia, unspecified: Secondary | ICD-10-CM | POA: Diagnosis not present

## 2012-10-06 DIAGNOSIS — I429 Cardiomyopathy, unspecified: Secondary | ICD-10-CM

## 2012-10-06 NOTE — Assessment & Plan Note (Signed)
Blood pressure controlled. Continue present medications. 

## 2012-10-06 NOTE — Assessment & Plan Note (Signed)
Patient with multiple embolic risk factors. Plan continue anticoagulation with pradaxa. Recent hemoglobin, BUN and creatinine normal. Plan repeat laboratories when she returns in 6 months. Continue metoprolol for rate control.

## 2012-10-06 NOTE — Patient Instructions (Addendum)
Your physician wants you to follow-up in: 6 MONTHS WITH DR Jens Som [place] You will receive a reminder letter in the mail two months in advance. If you don't receive a letter, please call our office to schedule the follow-up appointment. 6

## 2012-10-06 NOTE — Assessment & Plan Note (Signed)
Continue statin. 

## 2012-10-06 NOTE — Assessment & Plan Note (Signed)
Mildly reduced LV function. Continue beta blocker and ARB. Plan followup echocardiograms in the future.

## 2012-10-06 NOTE — Progress Notes (Signed)
HPI: 71 year-old female I initially saw in April of 2013 for evaluation of atrial fibrillation. Patient had a Myoview on 03/17/2012 for risk stratification. There was no ischemia. However the patient was noted to be in atrial fibrillation which was a new diagnosis. Echocardiogram in may of 2013 showed an ejection fraction of 40-45%. There was mild left atrial enlargement and right atrial enlargement. There was mild mitral regurgitation. There was trace aortic insufficiency. Holter monitor in May of 2013 showed atrial fibrillation with rate controlled. TSH normal at 2.87. Patient started on pradaxa. Decision made for rate control and anticoagulation. Hemoglobin in September of 2013 normal. BUN and creatinine 19 and 1.0. Since I saw her in May of 2013, the patient denies any dyspnea, orthopnea, PND, palpitations, syncope or chest pain; occasional mild pedal edema.  Current Outpatient Prescriptions  Medication Sig Dispense Refill  . allopurinol (ZYLOPRIM) 300 MG tablet Take 1 tablet (300 mg total) by mouth daily. Take 1 by mouth daily  90 tablet  3  . atorvastatin (LIPITOR) 40 MG tablet TAKE ONE TABLET BY MOUTH EVERY DAY  90 tablet  2  . clonazePAM (KLONOPIN) 1 MG tablet Take 1/2 to 1 tab by mouth up to three times per day as needed for anxiety and sleep  90 tablet  2  . colchicine 0.6 MG tablet Take 1 tablet (0.6 mg total) by mouth daily.  90 tablet  3  . CYMBALTA 60 MG capsule TAKE ONE CAPSULE BY MOUTH EVERY DAY  90 capsule  2  . dabigatran (PRADAXA) 150 MG CAPS Take 1 capsule (150 mg total) by mouth every 12 (twelve) hours.  60 capsule  12  . furosemide (LASIX) 40 MG tablet Take 1 tablet (40 mg total) by mouth daily.  90 tablet  3  . glucose blood (ONE TOUCH ULTRA TEST) test strip 1 strip per day - Use as instructed  250.02  100 each  12  . lisinopril (PRINIVIL,ZESTRIL) 5 MG tablet Take 1 tablet (5 mg total) by mouth daily. Take 1 by mouth daily  90 tablet  3  . metFORMIN (GLUCOPHAGE XR) 500 MG 24  hr tablet Take 1 tablet (500 mg total) by mouth daily with breakfast.  90 tablet  3  . metoprolol (LOPRESSOR) 100 MG tablet Take 1 tablet (100 mg total) by mouth 2 (two) times daily.  180 tablet  3  . zolpidem (AMBIEN CR) 6.25 MG CR tablet Take 1 tablet (6.25 mg total) by mouth at bedtime as needed for sleep.  30 tablet  5  . [DISCONTINUED] zolpidem (AMBIEN) 5 MG tablet TAKE ONE TABLET BY MOUTH EVERY DAY AT BEDTIME AS NEEDED FOR SLEEP  30 tablet  5     Past Medical History  Diagnosis Date  . Urine incontinence   . Arthritis   . Diabetes mellitus   . Hyperlipidemia   . Hypertension   . Thyroid disease   . Obesity 09/11/2011  . Chronic insomnia 09/11/2011  . Spinal stenosis 09/11/2011  . Fibromyalgia 09/11/2011  . Anxiety and depression 09/11/2011  . Sciatica of right side 09/11/2011  . Gout 09/11/2011  . Gait disorder 12/21/2011  . Atrial fibrillation   . Peripheral edema 06/21/2012  . OAB (overactive bladder) 06/21/2012    Past Surgical History  Procedure Date  . Thyroidectomy, partial     at 71yo  . Shoulder surgery     right - approx 2005  . Replacement total knee bilateral     right approx 1994,  left approx 1999    History   Social History  . Marital Status: Married    Spouse Name: N/A    Number of Children: 3  . Years of Education: N/A   Occupational History  . Not on file.   Social History Main Topics  . Smoking status: Never Smoker   . Smokeless tobacco: Not on file  . Alcohol Use: No  . Drug Use: No  . Sexually Active: Not on file   Other Topics Concern  . Not on file   Social History Narrative  . No narrative on file    ROS: no fevers or chills, productive cough, hemoptysis, dysphasia, odynophagia, melena, hematochezia, dysuria, hematuria, rash, seizure activity, orthopnea, PND, claudication. Remaining systems are negative.  Physical Exam: Well-developed morbidly obese in no acute distress.  Skin is warm and dry.  HEENT is normal.  Neck is  supple.  Chest is clear to auscultation with normal expansion.  Cardiovascular exam is irregular Abdominal exam nontender or distended. No masses palpated. Extremities show trace edema. neuro grossly intact  ECG atrial fibrillation at a rate of 71. Normal axis. Nonspecific ST changes. Low voltage.

## 2012-10-14 ENCOUNTER — Other Ambulatory Visit: Payer: Self-pay | Admitting: Internal Medicine

## 2012-12-20 ENCOUNTER — Ambulatory Visit: Payer: Medicare Other | Admitting: Internal Medicine

## 2012-12-20 DIAGNOSIS — Z0289 Encounter for other administrative examinations: Secondary | ICD-10-CM

## 2012-12-31 ENCOUNTER — Other Ambulatory Visit: Payer: Self-pay | Admitting: Internal Medicine

## 2013-01-03 ENCOUNTER — Other Ambulatory Visit: Payer: Self-pay | Admitting: Internal Medicine

## 2013-01-03 NOTE — Telephone Encounter (Signed)
Done erx 

## 2013-01-04 NOTE — Telephone Encounter (Signed)
Done hardcopy to robin  

## 2013-01-04 NOTE — Telephone Encounter (Signed)
Faxed hardcopy to pharmacy. 

## 2013-01-29 ENCOUNTER — Other Ambulatory Visit: Payer: Self-pay | Admitting: Internal Medicine

## 2013-02-06 ENCOUNTER — Other Ambulatory Visit: Payer: Self-pay | Admitting: Internal Medicine

## 2013-02-09 ENCOUNTER — Ambulatory Visit: Payer: Medicare Other | Admitting: Internal Medicine

## 2013-02-14 ENCOUNTER — Ambulatory Visit (INDEPENDENT_AMBULATORY_CARE_PROVIDER_SITE_OTHER): Payer: Medicare Other | Admitting: Internal Medicine

## 2013-02-14 ENCOUNTER — Other Ambulatory Visit (INDEPENDENT_AMBULATORY_CARE_PROVIDER_SITE_OTHER): Payer: Medicare Other

## 2013-02-14 ENCOUNTER — Encounter: Payer: Self-pay | Admitting: Internal Medicine

## 2013-02-14 VITALS — BP 122/90 | HR 77 | Temp 98.1°F

## 2013-02-14 DIAGNOSIS — E785 Hyperlipidemia, unspecified: Secondary | ICD-10-CM

## 2013-02-14 DIAGNOSIS — L02612 Cutaneous abscess of left foot: Secondary | ICD-10-CM

## 2013-02-14 DIAGNOSIS — I10 Essential (primary) hypertension: Secondary | ICD-10-CM

## 2013-02-14 DIAGNOSIS — R5381 Other malaise: Secondary | ICD-10-CM | POA: Diagnosis not present

## 2013-02-14 DIAGNOSIS — L84 Corns and callosities: Secondary | ICD-10-CM

## 2013-02-14 DIAGNOSIS — G8929 Other chronic pain: Secondary | ICD-10-CM

## 2013-02-14 DIAGNOSIS — R5383 Other fatigue: Secondary | ICD-10-CM | POA: Insufficient documentation

## 2013-02-14 DIAGNOSIS — L03119 Cellulitis of unspecified part of limb: Secondary | ICD-10-CM

## 2013-02-14 LAB — HEMOGLOBIN A1C: Hgb A1c MFr Bld: 10.7 % — ABNORMAL HIGH (ref 4.6–6.5)

## 2013-02-14 LAB — LIPID PANEL
Cholesterol: 146 mg/dL (ref 0–200)
HDL: 38.4 mg/dL — ABNORMAL LOW (ref >39.00)
Triglycerides: 145 mg/dL (ref 0.0–149.0)
VLDL: 29 mg/dL (ref 0.0–40.0)

## 2013-02-14 LAB — CBC WITH DIFFERENTIAL/PLATELET
Eosinophils Relative: 3.6 % (ref 0.0–5.0)
HCT: 42.7 % (ref 36.0–46.0)
Lymphs Abs: 1.6 10*3/uL (ref 0.7–4.0)
Monocytes Relative: 7.6 % (ref 3.0–12.0)
Platelets: 160 10*3/uL (ref 150.0–400.0)
RBC: 4.41 Mil/uL (ref 3.87–5.11)
WBC: 7.1 10*3/uL (ref 4.5–10.5)

## 2013-02-14 LAB — BASIC METABOLIC PANEL
BUN: 20 mg/dL (ref 6–23)
Calcium: 9.3 mg/dL (ref 8.4–10.5)
Creatinine, Ser: 1.2 mg/dL (ref 0.4–1.2)
GFR: 48.95 mL/min — ABNORMAL LOW (ref >60.00)
Glucose, Bld: 248 mg/dL — ABNORMAL HIGH (ref 70–99)
Potassium: 4.6 mEq/L (ref 3.5–5.1)

## 2013-02-14 LAB — HEPATIC FUNCTION PANEL
AST: 44 U/L — ABNORMAL HIGH (ref 0–37)
Albumin: 3.9 g/dL (ref 3.5–5.2)

## 2013-02-14 LAB — TSH: TSH: 2.36 u[IU]/mL (ref 0.35–5.50)

## 2013-02-14 NOTE — Assessment & Plan Note (Signed)
stable overall by history and exam, recent data reviewed with pt, and pt to continue medical treatment as before,  to f/u any worsening symptoms or concerns Lab Results  Component Value Date   LDLCALC 79 02/14/2013

## 2013-02-14 NOTE — Assessment & Plan Note (Signed)
stable overall by history and exam, recent data reviewed with pt, and pt to continue medical treatment as before,  to f/u any worsening symptoms or concerns BP Readings from Last 3 Encounters:  02/14/13 122/90  10/06/12 118/81  06/21/12 120/80

## 2013-02-14 NOTE — Patient Instructions (Signed)
Please continue all other medications as before Please have the pharmacy call with any other refills you may need. Please go to the LAB in the Basement (turn left off the elevator) for the tests to be done today You will be contacted by phone if any changes need to be made immediately.  Otherwise, you will receive a letter about your results with an explanation, but please check with MyChart first. Thank you for enrolling in MyChart. Please follow the instructions below to securely access your online medical record. MyChart allows you to send messages to your doctor, view your test results, renew your prescriptions, schedule appointments, and more. To Log into My Chart online, please go by [employer] or [employer] to [email or web address], or download the MyChart App from the [employer] of [employer] .  Your Username is: [email or web address] ([email or web address]) Please send a practice Message on Mychart later today. Please keep your appointments with your specialists as you have planned - cardiology Please return in 6 months, or sooner if needed

## 2013-02-14 NOTE — Progress Notes (Signed)
Subjective:    Patient ID: Bonnie Clark, female    DOB: 08-02-1941, 72 y.o.   MRN: [id number]  HPI  Here to f/u; overall doing ok,  Pt denies chest pain, increased sob or doe, wheezing, orthopnea, PND, increased LE swelling, palpitations, dizziness or syncope.  Pt denies polydipsia, polyuria, or low sugar symptoms such as weakness or confusion improved with po intake.  Pt denies new neurological symptoms such as new headache, or facial or extremity weakness or numbness.   Pt states overall good compliance with meds, has been trying to follow lower cholesterol, diabetic diet, with wt overall stable,  but little exercise however. Does c/o ongoing fatigue, but denies signficant daytime hypersomnolence.  Now essentialy chairbound, cannot stand or walk even a few steps without assit.  No falls. Sees Dr Crenshaw/card, now on pradaxa, no overt bleeing or bruising.   Pt continues to have recurring LBP without change in severity, bowel or bladder change, fever, wt loss. Did have small abscess left post heel drained several days ago.  Toe callous to left great toe worsening  Husband struggling to move her when needed due to her size, remains supportive  Pt still refuses offered surgury for lower back per Dr Shelle Iron, for spinal stenosis Past Medical History  Diagnosis Date  . Urine incontinence   . Arthritis   . Diabetes mellitus   . Hyperlipidemia   . Hypertension   . Thyroid disease   . Obesity 09/11/2011  . Chronic insomnia 09/11/2011  . Spinal stenosis 09/11/2011  . Fibromyalgia 09/11/2011  . Anxiety and depression 09/11/2011  . Sciatica of right side 09/11/2011  . Gout 09/11/2011  . Gait disorder 12/21/2011  . Atrial fibrillation   . Peripheral edema 06/21/2012  . OAB (overactive bladder) 06/21/2012   Past Surgical History  Procedure Laterality Date  . Thyroidectomy, partial      at 72yo  . Shoulder surgery      right - approx 2005  . Replacement total knee bilateral      right approx 1994, left  approx 1999    reports that she has never smoked. She does not have any smokeless tobacco history on file. She reports that she does not drink alcohol or use illicit drugs. family history includes Arthritis in her father and mother; Depression in her mother; and Stroke in her sister. No Known Allergies Current Outpatient Prescriptions on File Prior to Visit  Medication Sig Dispense Refill  . allopurinol (ZYLOPRIM) 300 MG tablet Take 1 tablet (300 mg total) by mouth daily. Take 1 by mouth daily  90 tablet  3  . atorvastatin (LIPITOR) 40 MG tablet TAKE ONE TABLET BY MOUTH EVERY DAY  90 tablet  2  . clonazePAM (KLONOPIN) 1 MG tablet Take 1/2 to 1 tab by mouth up to three times per day as needed for anxiety and sleep  90 tablet  2  . colchicine 0.6 MG tablet Take 1 tablet (0.6 mg total) by mouth daily.  90 tablet  3  . CYMBALTA 60 MG capsule TAKE ONE CAPSULE BY MOUTH EVERY DAY  90 capsule  2  . dabigatran (PRADAXA) 150 MG CAPS Take 1 capsule (150 mg total) by mouth every 12 (twelve) hours.  60 capsule  12  . furosemide (LASIX) 40 MG tablet Take 1 tablet (40 mg total) by mouth daily.  90 tablet  3  . lisinopril (PRINIVIL,ZESTRIL) 5 MG tablet Take 1 tablet (5 mg total) by mouth daily. Take 1 by mouth daily  90 tablet  3  . metFORMIN (GLUCOPHAGE-XR) 500 MG 24 hr tablet TAKE ONE TABLET BY MOUTH EVERY DAY WITH BREAKFAST  90 tablet  1  . metoprolol (LOPRESSOR) 100 MG tablet Take 1 tablet (100 mg total) by mouth 2 (two) times daily.  180 tablet  3  . traMADol (ULTRAM) 50 MG tablet TAKE ONE TABLET BY MOUTH EVERY 6 HOURS AS NEEDED FOR PAIN  120 tablet  1  . zolpidem (AMBIEN) 5 MG tablet TAKE ONE TABLET BY MOUTH EVERY DAY AT BEDTIME AS NEEDED FOR SLEEP  30 tablet  4  . zolpidem (AMBIEN CR) 6.25 MG CR tablet Take 1 tablet (6.25 mg total) by mouth at bedtime as needed for sleep.  30 tablet  5   No current facility-administered medications on file prior to visit.    Review of Systems  Constitutional:  Negative for unexpected weight change, or unusual diaphoresis  HENT: Negative for tinnitus.   Eyes: Negative for photophobia and visual disturbance.  Respiratory: Negative for choking and stridor.   Gastrointestinal: Negative for vomiting and blood in stool.  Genitourinary: Negative for hematuria and decreased urine volume.  Musculoskeletal: Negative for acute joint swelling Skin: Negative for color change and wound.  Neurological: Negative for tremors and numbness other than noted  Psychiatric/Behavioral: Negative for decreased concentration or  hyperactivity.       Objective:   Physical Exam BP 122/90  Pulse 77  Temp(Src) 98.1 F (36.7 C) (Oral)  SpO2 97% VS noted, not ill appeariung, not able to get wt but appears to have gained Constitutional: Pt appears well-developed and well-nourished. /morbid obese, I estimate approx 330-350 lbs HENT: Head: NCAT.  Right Ear: External ear normal.  Left Ear: External ear normal.  Eyes: Conjunctivae and EOM are normal. Pupils are equal, round, and reactive to light.  Neck: Normal range of motion. Neck supple.  Cardiovascular: Normal rate and regular rhythm.   Pulmonary/Chest: Effort normal and breath sounds normal.  Neurological: Pt is alert. Not confused  Skin: Skin is warm. No erythema. Left great toe callous noted, nontender, no ulcer Post left heel with minor tender < 1/2 cm healing abscess area Psychiatric: Pt behavior is normal. Thought content normal. 2+nervous    Assessment & Plan:

## 2013-02-14 NOTE — Assessment & Plan Note (Addendum)
stable overall by history and exam, and pt to continue medical treatment as before,  to f/u any worsening symptoms or concerns 

## 2013-02-14 NOTE — Assessment & Plan Note (Addendum)
Etiology unclear, Exam otherwise benign, to check labs as documented, follow with expectant management  Note:  Total time for pt hx, exam, review of record with pt in the room, determination of diagnoses and plan for further eval and tx is > 40 min, with over 50% spent in coordination and counseling of patient  

## 2013-02-14 NOTE — Assessment & Plan Note (Signed)
Small, minor, drained, cont neosporin as she has been doing

## 2013-02-14 NOTE — Assessment & Plan Note (Signed)
For podiatry referral 

## 2013-02-14 NOTE — Assessment & Plan Note (Signed)
Cont current tx, encourage pt to f/u with Dr Roosevelt Locks for LBP

## 2013-02-15 ENCOUNTER — Other Ambulatory Visit: Payer: Self-pay | Admitting: Internal Medicine

## 2013-02-15 DIAGNOSIS — IMO0001 Reserved for inherently not codable concepts without codable children: Secondary | ICD-10-CM

## 2013-02-16 ENCOUNTER — Telehealth: Payer: Self-pay

## 2013-02-16 NOTE — Telephone Encounter (Signed)
I think Dr Lafe Garin is a fine MD, especially for sugar and endocrine issues, and I would see her myself if I needed a referral, and I have only heard good things about her from other patients, and I always receive information and notes in a timely fashion that helps with care.  I would not say Dr Everardo All is "better", and Dr Lafe Garin is quite experienced as well.  thanks

## 2013-02-16 NOTE — Telephone Encounter (Signed)
Called the patients husband informed of MD's information on Dr. Lafe Garin.  The patient was very appreciative of the phone call and stated a good recommendation by PCP that they would certainly keep the appointment with Dr. Lafe Garin.

## 2013-02-16 NOTE — Telephone Encounter (Signed)
The patients husband called back after receiving phone call from PCP's of Endo. Referral.  Evidently Dr. Everardo All is not taking new patients so they scheduled the patient with Dr. Wyonia Hough.  The husband stated the patient does not do well with female MD's and would like to know if anything you could do to get her scheduled with Dr. Everardo All or other MD other than female.  He states he wants only the best due to her medical problems and difficulty with female MD's.  Advise please

## 2013-02-24 ENCOUNTER — Encounter: Payer: Self-pay | Admitting: Internal Medicine

## 2013-02-24 ENCOUNTER — Ambulatory Visit (INDEPENDENT_AMBULATORY_CARE_PROVIDER_SITE_OTHER): Payer: Medicare Other | Admitting: Internal Medicine

## 2013-02-24 VITALS — BP 124/72 | HR 69 | Temp 98.3°F | Resp 12

## 2013-02-24 DIAGNOSIS — IMO0001 Reserved for inherently not codable concepts without codable children: Secondary | ICD-10-CM

## 2013-02-24 MED ORDER — SITAGLIPTIN PHOSPHATE 100 MG PO TABS: 100.0000 mg | ORAL_TABLET | Freq: Every day | ORAL | Status: DC

## 2013-02-24 MED ORDER — METFORMIN HCL ER 500 MG PO TB24: 500.0000 mg | ORAL_TABLET | Freq: Two times a day (BID) | ORAL | Status: DC

## 2013-02-24 NOTE — Patient Instructions (Signed)
Please return in 1 month with your sugar log.  Please increase the Metformin, adding a second tablet with dinner. Please start Januvia 100 mg daily, in am. You will be called with the appointment to the eye dr.  Patient instructions for Diabetes mellitus type 2:  DIET AND EXERCISE Diet and exercise is an important part of diabetic treatment.  We recommended aerobic exercise in the form of brisk walking (working between 40-60% of maximal aerobic capacity, similar to brisk walking) for 150 minutes per week (such as 30 minutes five days per week) along with 3 times per week performing 'resistance' training (using various gauge rubber tubes with handles) 5-10 exercises involving the major muscle groups (upper body, lower body and core) performing 10-15 repetitions (or near fatigue) each exercise. Start at half the above goal but build slowly to reach the above goals. If limited by weight, joint pain, or disability, we recommend daily walking in a swimming pool with water up to waist to reduce pressure from joints while allow for adequate exercise.    BLOOD GLUCOSES Monitoring your blood glucoses is important for continued management of your diabetes. Please check your blood glucoses 2-4 times a day: fasting, before meals and at bedtime (you can rotate these measurements - e.g. one day check before the 3 meals, the next day check before 2 of the meals and before bedtime, etc.   HYPOGLYCEMIA (low blood sugar) Hypoglycemia is usually a reaction to not eating, exercising, or taking too much insulin/ other diabetes drugs.  Symptoms include tremors, sweating, hunger, confusion, headache, etc. Treat IMMEDIATELY with 15 grams of Carbs:   4 glucose tablets    cup regular juice/soda   2 tablespoons raisins   4 teaspoons sugar   1 tablespoon honey Recheck blood glucose in 15 mins and repeat above if still symptomatic/blood glucose <100. Please contact our office at [phone] if you have questions about  how to next handle your insulin.  RECOMMENDATIONS TO REDUCE YOUR RISK OF DIABETIC COMPLICATIONS: * Take your prescribed MEDICATION(S). * Follow a DIABETIC diet: Complex carbs, fiber rich foods, heart healthy fish twice weekly, (monounsaturated and polyunsaturated) fats * AVOID saturated/trans fats, high fat foods, >2,300 mg salt per day. * EXERCISE at least 5 times a week for 30 minutes or preferably daily.  * DO NOT SMOKE OR DRINK more than 1 drink a day. * Check your FEET every day. Do not wear tightfitting shoes. Contact us if you develop an ulcer * See your EYE doctor once a year or more if needed * Get a FLU shot once a year * Get a PNEUMONIA vaccine every 5 years and once after age 27 years  GOALS:  * Your Hemoglobin A1c of 7%  * Your Systolic BP should be 140 or lower  * Your Diastolic BP should be 80 or lower  * Your HDL (Good Cholesterol) should be 40 or higher  * Your LDL (Bad Cholesterol) should be 100 or lower  * Your Triglycerides should be 150 or lower  * Your Urine microalbumin (kidney function) of <30 * Your Body Mass Index should be 25 or lower  We will be glad to help you achieve these goals. Our telephone number is: [phone].

## 2013-02-24 NOTE — Progress Notes (Signed)
Subjective:     Patient ID: Bonnie Clark, female   DOB: [birth year], 72 y.o.   MRN: [id number]  HPI Bonnie Clark is a 72 year old woman referred by her PCP, Dr. Jonny Ruiz, for management of DM2, non-insulin-dependent, uncontrolled, with complications (CKD stage 2-3). She is here with her husband, who offers part of the history.  She has been diagnosed with diabetes in 2013, approx 6 mo ago, she has previously been prediabetic. She has not been on insulin. Her last hemoglobin A1c was 10.7% on 02/14/2013, previously 7.3% 10 months ago. She is on metformin XR 500 mg once daily with breakfast. Her last BUN/creatinine was 20/1.2, for a GFR of 49, stable. Previous BUN/creatinine was 19/1.0.  She checks her sugars 0-1 times a day.  - am: 140-150 Lowest sugar: 110, had hypoglycemia awareness with this. Highest sugar: less than 200.   Meals: - Breakfast: coffee with Splenda, toast - Lunch: sandwich, milk - Dinner: meat, potatoes, vegetables - Snacks: 1, but grazing  Her last eye exam was 2012. Had cataract sx 2 years ago. She has numbness or tingling in her legs. 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   She has sciatica and back pain and this limits her walking. She is mostly in a chair in her dining room during the day and is very hard for her to move around, so the only thing that she enjoys is food, and she mentions that she knows she is overeating, especially snacks. Her husband is cooking, and he usually fixes her meals too, so when he is not at home during the day, she might not eat lunch. She does have snacks around her all the time, so it is easy for her to graze instead of eating a formal lunch. They do have breakfast and dinner together. She tells me that they have an early breakfast, after which the patient goes to sleep in her recliner, as she does not sleep well at night. She has a late lunch, which can be at 3 PM, and a very late dinner along with her husband. [facility] and he has tried  to take Bonnie Clark with him, but she cannot make it to the pool there. She also describes to me that she cannot go off the stairs in her house, and she has not been upstairs in 2 years, as she cannot raise her legs to go up the stairs. She has problems going anywhere because of his disability. She admits that she is depressed about not being able to move.  Moved from [facility] 05/2011. She saw endocrinology in [place].   Review of Systems Constitutional: no weight gain/loss, + fatigue, decreased appetite, no subjective hyperthermia/hypothermia, + poor sleep, nocturia Eyes: no blurry vision, no xerophthalmia ENT: no sore throat, no nodules palpated in throat, no dysphagia/odynophagia, + hoarseness Cardiovascular: no CP/SOB/palpitations/leg swelling Respiratory: no cough/+ SOB Gastrointestinal: no N/V/D/C Musculoskeletal: + muscle/+joint aches/+ joint swelling Skin: no rashes, + itching, + hair loss Neurological: no tremors/numbness/tingling/dizziness Psychiatric: no depression/anxiety  PMH: Patient has a past medical history of hypertension, hyperlipidemia, fibromyalgia, small abscess left heel-drained approximately 2 weeks ago, spinal stenosis with low back pain, osteoarthritis, with bilateral knee replacements 15 and 20 years ago, urinary incontinence, history of partial thyroidectomy at 22, gout, atrial fibrillation-on Pradaxa-seen by Dr. Jens Som.  Past Surgical History  Procedure Laterality Date  . Thyroidectomy, partial      at 72yo  . Shoulder surgery      right - approx 2005  .  Replacement total knee bilateral      right approx 1994, left approx 1999   History   Social History  . Marital Status: Married    Spouse Name: N/A    Number of Children: 3  . Years of Education: N/A   Occupational History  . Retired Charity fundraiser, worked in a nursing home   Social History Main Topics  . Smoking status: Never Smoker   . Smokeless tobacco: Never Used  . Alcohol Use: No  . Drug Use: No  . Sexually  Active: Not on file   Other Topics Concern  . Not on file   Social History Narrative  . No narrative on file   .meds Medication Sig  . allopurinol (ZYLOPRIM) 300 MG tablet Take 1 tablet (300 mg total) by mouth daily. Take 1 by mouth daily  . atorvastatin (LIPITOR) 40 MG tablet TAKE ONE TABLET BY MOUTH EVERY DAY  . clonazePAM (KLONOPIN) 1 MG tablet Take 1/2 to 1 tab by mouth up to three times per day as needed for anxiety and sleep  . colchicine 0.6 MG tablet Take 1 tablet (0.6 mg total) by mouth daily.  . CYMBALTA 60 MG capsule TAKE ONE CAPSULE BY MOUTH EVERY DAY  . dabigatran (PRADAXA) 150 MG CAPS Take 1 capsule (150 mg total) by mouth every 12 (twelve) hours.  . furosemide (LASIX) 40 MG tablet Take 1 tablet (40 mg total) by mouth daily.  [name] Kitchen lisinopril (PRINIVIL,ZESTRIL) 5 MG tablet Take 1 tablet (5 mg total) by mouth daily. Take 1 by mouth daily  . metFORMIN (GLUCOPHAGE-XR) 500 MG 24 hr tablet TAKE ONE TABLET BY MOUTH EVERY DAY WITH BREAKFAST  . metoprolol (LOPRESSOR) 100 MG tablet Take 1 tablet (100 mg total) by mouth 2 (two) times daily.  . traMADol (ULTRAM) 50 MG tablet TAKE ONE TABLET BY MOUTH EVERY 6 HOURS AS NEEDED FOR PAIN  . zolpidem (AMBIEN CR) 6.25 MG CR tablet Take 1 tablet (6.25 mg total) by mouth at bedtime as needed for sleep.  [name] Kitchen zolpidem (AMBIEN) 5 MG tablet TAKE ONE TABLET BY MOUTH EVERY DAY AT BEDTIME AS NEEDED FOR SLEEP  No Known Allergies  Family History  Problem Relation Age of Onset  . Arthritis Mother   . Depression Mother   . Arthritis Father   . Stroke Sister    Objective:   Physical Exam BP 124/72  Pulse 69  Temp(Src) 98.3 F (36.8 C) (Oral)  Resp 12  SpO2 95% Wt Readings from Last 3 Encounters:  06/21/12 333 lb (151.048 kg)  04/21/12 333 lb (151.048 kg)  03/19/12 333 lb (151.048 kg)  Constitutional: overweight, in NAD Eyes: PERRLA, EOMI, no exophthalmos ENT: moist mucous membranes, no thyromegaly, no cervical lymphadenopathy Cardiovascular:  RRR, No MRG, nonpitting edema bilateral legs Respiratory: CTA B Gastrointestinal: abdomen soft, NT, ND, BS+ Musculoskeletal: no deformities, strength intact in all 4 Skin: moist, warm, no rashes Neurological: no tremor with outstretched hands, DTR normal in all 4  Assessment:     1. DM2, non-insulin-dependent, uncontrolled, with complications - CKD stage 3    Plan:     Patient with a short history of DM 2, with a recent increase in hemoglobin A1c of approximately 3%. This is likely due to her inability to move because of back pain and her frequent snacking.  - we discussed about the fact that she should try to exercise even sitting down mostly for arm strength, but also to try to build some strength in her legs too -  We did discuss about diet, the patient mentions that she knows that she overeats, she knows that she should not do this, but she cannot stop it - I discussed with her about increasing her metformin XR to 500 mg twice a day, I would have liked to go to the target dose of 1000 mg twice a day, however she has stage 3 CKD, with a GFR of 49, so would like to stay at half-maximum dose.  - Due to the fact that her sugars in the morning are not high, a hemoglobin A1c of 10 probably results from high sugars during the day, so I suggested that we add Januvia to help with these. I would start her at 50 mg daily for 5 days, then increase to 100 mg daily. He does not have history of pancreatitis. - given sugar log and advised how to fill it and to bring it at next appt: She should check every morning, and once more before the rest of the meals or bedtime, rotating checktimes - given foot care handout and explained the principles - given instructions for hypoglycemia management "15-15 rule" - I will see her back in one month with her sugar log - Since she did not have an eye exam in the last 2 years, I will refer her to ophthalmology

## 2013-02-28 ENCOUNTER — Ambulatory Visit: Payer: Medicare Other | Admitting: Internal Medicine

## 2013-02-28 ENCOUNTER — Encounter: Payer: Self-pay | Admitting: Internal Medicine

## 2013-03-22 ENCOUNTER — Encounter (INDEPENDENT_AMBULATORY_CARE_PROVIDER_SITE_OTHER): Payer: Self-pay | Admitting: Ophthalmology

## 2013-03-24 ENCOUNTER — Encounter (INDEPENDENT_AMBULATORY_CARE_PROVIDER_SITE_OTHER): Payer: Medicare Other | Admitting: Ophthalmology

## 2013-03-24 DIAGNOSIS — E1139 Type 2 diabetes mellitus with other diabetic ophthalmic complication: Secondary | ICD-10-CM

## 2013-03-24 DIAGNOSIS — E11319 Type 2 diabetes mellitus with unspecified diabetic retinopathy without macular edema: Secondary | ICD-10-CM

## 2013-03-24 DIAGNOSIS — I10 Essential (primary) hypertension: Secondary | ICD-10-CM

## 2013-03-24 DIAGNOSIS — H35039 Hypertensive retinopathy, unspecified eye: Secondary | ICD-10-CM

## 2013-03-24 DIAGNOSIS — E1165 Type 2 diabetes mellitus with hyperglycemia: Secondary | ICD-10-CM | POA: Diagnosis not present

## 2013-03-24 DIAGNOSIS — H43819 Vitreous degeneration, unspecified eye: Secondary | ICD-10-CM

## 2013-03-31 ENCOUNTER — Ambulatory Visit (INDEPENDENT_AMBULATORY_CARE_PROVIDER_SITE_OTHER): Payer: Medicare Other | Admitting: Internal Medicine

## 2013-03-31 ENCOUNTER — Encounter: Payer: Self-pay | Admitting: Internal Medicine

## 2013-03-31 VITALS — BP 124/72 | HR 73 | Temp 98.3°F | Resp 12

## 2013-03-31 MED ORDER — GLUCOSE BLOOD VI STRP: ORAL_STRIP | Status: DC

## 2013-03-31 NOTE — Progress Notes (Signed)
Subjective:     Patient ID: Bonnie Clark, female   DOB: Oct 19, 1941, 72 y.o.   MRN: [id number]  HPI Bonnie Clark is a 72 year old woman returning for f/u for DM2, dx 2013, non-insulin-dependent, uncontrolled, with complications (CKD stage 2-3). She is here with her husband, who offers part of the history.   Her last hemoglobin A1c was 10.7% on 02/14/2013, previously 7.3% 10 months ago. She is on: - metformin XR 500 mg bid - Januvia  100 mg daily - 30 day supply 150$ with insurance, 390$ without (if in the doughnut hole).   She checks her sugars 2 times a day: greatly improved in last 1-2 weeks. - am: 140-150 >>114-130 - before dinner: 111-138 - bedtime: 150 and a sugar of 234 Lowest sugar: 111, had hypoglycemia awareness with this. Highest sugar: l234  She admitted for overeating at last visit, but she improved her diet and she feels much better and is very excited about her better sugars. She has sciatica and back pain and this limits her walking. She is mostly in a chair in her dining room during the day and is very hard for her to move around.   Her last eye exam was 2012. Had cataract sx 2 years ago. She has numbness or tingling in her legs. 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 Her last BUN/creatinine was 20/1.2, for a GFR of 49, stable. Previous BUN/creatinine was 19/1.0. Moved from [facility] 05/2011.   Review of Systems Constitutional: no weight gain/loss, + fatigue, decreased appetite, no subjective hyperthermia/hypothermia, + poor sleep, nocturia Eyes: no blurry vision, no xerophthalmia ENT: no sore throat, no nodules palpated in throat, no dysphagia/odynophagia, + hoarseness Cardiovascular: no CP/SOB/palpitations/leg swelling Respiratory: no cough/+ SOB Gastrointestinal: no N/V/D/C Musculoskeletal: + muscle/+joint aches/+ joint swelling Skin: no rashes, + itching, + hair loss Neurological: no tremors/numbness/tingling/dizziness Psychiatric: no depression/anxiety  PMH: Patient has a past  medical history of hypertension, hyperlipidemia, fibromyalgia, small abscess left heel-drained approximately 2 weeks ago, spinal stenosis with low back pain, osteoarthritis, with bilateral knee replacements 15 and 20 years ago, urinary incontinence, history of partial thyroidectomy at 22, gout, atrial fibrillation-on Pradaxa-seen by Dr. Jens Som.  I reviewed pt's medications, allergies, PMH, social hx, family hx and no changes required.  Objective:   Physical Exam BP 124/72  Pulse 73  Temp(Src) 98.3 F (36.8 C) (Oral)  Resp 12  SpO2 95% Wt Readings from Last 3 Encounters:  06/21/12 333 lb (151.048 kg)  04/21/12 333 lb (151.048 kg)  03/19/12 333 lb (151.048 kg)  Refused weighing. Constitutional: overweight, in NAD Eyes: PERRLA, EOMI, no exophthalmos ENT: moist mucous membranes, no thyromegaly, no cervical lymphadenopathy Cardiovascular: RRR, No MRG, nonpitting edema bilateral legs Respiratory: CTA B Gastrointestinal: abdomen soft, NT, ND, BS+ Musculoskeletal: no deformities, strength intact in all 4 Skin: moist, warm, no rashes Neurological: no tremor with outstretched hands, DTR normal in all 4  Assessment:     1. DM2, non-insulin-dependent, uncontrolled, with complications - CKD stage 3    Plan:     Patient with a short history of DM 2, with a recent increase in hemoglobin A1c of approximately 3%. This is likely due to her inability to move because of back pain and her frequent snacking. She improved her diet, and her sugars are greatly improved since last visit. - at last visit, I suggested some exercise even sitting down mostly for arm strength, but also to try to build some strength in her legs too. She started to do these, and  she feels that her legs are more mobile now. - continue metformin XR to 500 mg twice a day; I would have liked to go to the target dose of 1000 mg twice a day, however she has stage 3 CKD, with a GFR of 49, so would like to stay at half-maximum dose.   - continue Januvia 100 mg daily (given samples) - continue to check sugars bid - I will see her back in one month with her sugar log

## 2013-03-31 NOTE — Patient Instructions (Signed)
Please return in 3 months with your sugar log. Continue current diabetic regimen. Keep up the good work!

## 2013-04-04 ENCOUNTER — Telehealth: Payer: Self-pay

## 2013-04-04 MED ORDER — METFORMIN HCL ER 500 MG PO TB24: 500.0000 mg | ORAL_TABLET | Freq: Two times a day (BID) | ORAL | Status: DC

## 2013-04-04 NOTE — Telephone Encounter (Signed)
Ok for routine refill ?

## 2013-04-04 NOTE — Telephone Encounter (Signed)
Please advise on metformin prescription.  Patient states is now taking bid please advise

## 2013-04-17 ENCOUNTER — Other Ambulatory Visit: Payer: Self-pay | Admitting: Cardiology

## 2013-04-20 ENCOUNTER — Other Ambulatory Visit: Payer: Self-pay | Admitting: *Deleted

## 2013-04-20 MED ORDER — DABIGATRAN ETEXILATE MESYLATE 150 MG PO CAPS: ORAL_CAPSULE | ORAL | Status: DC

## 2013-04-22 ENCOUNTER — Other Ambulatory Visit: Payer: Self-pay | Admitting: Internal Medicine

## 2013-05-11 ENCOUNTER — Encounter: Payer: Self-pay | Admitting: Cardiology

## 2013-05-11 ENCOUNTER — Other Ambulatory Visit: Payer: Self-pay | Admitting: Internal Medicine

## 2013-05-11 ENCOUNTER — Ambulatory Visit (INDEPENDENT_AMBULATORY_CARE_PROVIDER_SITE_OTHER): Payer: Medicare Other | Admitting: Cardiology

## 2013-05-11 ENCOUNTER — Ambulatory Visit: Payer: Medicare Other | Admitting: Cardiology

## 2013-05-11 VITALS — BP 120/70 | HR 78

## 2013-05-11 DIAGNOSIS — I4891 Unspecified atrial fibrillation: Secondary | ICD-10-CM

## 2013-05-11 DIAGNOSIS — I428 Other cardiomyopathies: Secondary | ICD-10-CM | POA: Diagnosis not present

## 2013-05-11 DIAGNOSIS — I10 Essential (primary) hypertension: Secondary | ICD-10-CM | POA: Diagnosis not present

## 2013-05-11 DIAGNOSIS — E785 Hyperlipidemia, unspecified: Secondary | ICD-10-CM

## 2013-05-11 DIAGNOSIS — I429 Cardiomyopathy, unspecified: Secondary | ICD-10-CM

## 2013-05-11 NOTE — Assessment & Plan Note (Signed)
Patient with multiple embolic risk factors. Plan continue anticoagulation with pradaxa. Check hemoglobin and renal function. Continue metoprolol for rate control.

## 2013-05-11 NOTE — Assessment & Plan Note (Signed)
Continue statin.management per primary care. 

## 2013-05-11 NOTE — Assessment & Plan Note (Signed)
Continue beta blocker and ACE inhibitor. Repeat echocardiogram.

## 2013-05-11 NOTE — Assessment & Plan Note (Signed)
Blood pressure controlled. Continue present medications. 

## 2013-05-11 NOTE — Progress Notes (Signed)
HPI: Pleasant female for fu of atrial fibrillation. Patient had a Myoview on 03/17/2012 for risk stratification. There was no ischemia. However the patient was noted to be in atrial fibrillation which was a new diagnosis. Echocardiogram in May of 2013 showed an ejection fraction of 40-45%. There was mild left atrial enlargement and right atrial enlargement. There was mild mitral regurgitation. There was trace aortic insufficiency. Holter monitor in May of 2013 showed atrial fibrillation with rate controlled. TSH normal at 2.87. Patient started on pradaxa. Decision made for rate control and anticoagulation. Since I saw her in Nov of 2013, the patient denies any dyspnea, orthopnea, PND, palpitations, syncope or chest pain; occasional mild pedal edema.   Current Outpatient Prescriptions  Medication Sig Dispense Refill  . allopurinol (ZYLOPRIM) 300 MG tablet Take 1 tablet (300 mg total) by mouth daily. Take 1 by mouth daily  90 tablet  3  . atorvastatin (LIPITOR) 40 MG tablet TAKE ONE TABLET BY MOUTH EVERY DAY  90 tablet  2  . clonazePAM (KLONOPIN) 1 MG tablet Take 1/2 to 1 tab by mouth up to three times per day as needed for anxiety and sleep  90 tablet  2  . colchicine 0.6 MG tablet Take 1 tablet (0.6 mg total) by mouth daily.  90 tablet  3  . CYMBALTA 60 MG capsule TAKE ONE CAPSULE BY MOUTH EVERY DAY  90 capsule  2  . dabigatran (PRADAXA) 150 MG CAPS TAKE ONE CAPSULE BY MOUTH EVERY 12 HOURS  60 capsule  4  . furosemide (LASIX) 40 MG tablet Take 1 tablet (40 mg total) by mouth daily.  90 tablet  3  . glucose blood (ONE TOUCH TEST STRIPS) test strip Use as instructed - check sugars 2 x a day  300 each  11  . lisinopril (PRINIVIL,ZESTRIL) 5 MG tablet Take 1 tablet (5 mg total) by mouth daily. Take 1 by mouth daily  90 tablet  3  . metFORMIN (GLUCOPHAGE-XR) 500 MG 24 hr tablet Take 1 tablet (500 mg total) by mouth 2 (two) times daily.  180 tablet  3  . metoprolol (LOPRESSOR) 100 MG tablet Take 1 tablet  (100 mg total) by mouth 2 (two) times daily.  180 tablet  3  . sitaGLIPtin (JANUVIA) 100 MG tablet Take 1 tablet (100 mg total) by mouth daily.  30 tablet  11  . traMADol (ULTRAM) 50 MG tablet TAKE ONE TABLET BY MOUTH EVERY 6 HOURS AS NEEDED FOR PAIN  120 tablet  0  . zolpidem (AMBIEN) 5 MG tablet TAKE ONE TABLET BY MOUTH EVERY DAY AT BEDTIME AS NEEDED FOR SLEEP  30 tablet  4   No current facility-administered medications for this visit.     Past Medical History  Diagnosis Date  . Urine incontinence   . Arthritis   . Diabetes mellitus   . Hyperlipidemia   . Hypertension   . Thyroid disease   . Obesity 09/11/2011  . Chronic insomnia 09/11/2011  . Spinal stenosis 09/11/2011  . Fibromyalgia 09/11/2011  . Anxiety and depression 09/11/2011  . Sciatica of right side 09/11/2011  . Gout 09/11/2011  . Gait disorder 12/21/2011  . Atrial fibrillation   . Peripheral edema 06/21/2012  . OAB (overactive bladder) 06/21/2012    Past Surgical History  Procedure Laterality Date  . Thyroidectomy, partial      at 72yo  . Shoulder surgery      right - approx 2005  . Replacement total knee bilateral  right approx 1994, left approx 1999    History   Social History  . Marital Status: Married    Spouse Name: N/A    Number of Children: 3  . Years of Education: N/A   Occupational History  . Not on file.   Social History Main Topics  . Smoking status: Never Smoker   . Smokeless tobacco: Never Used  . Alcohol Use: No  . Drug Use: No  . Sexually Active: Not on file   Other Topics Concern  . Not on file   Social History Narrative  . No narrative on file    ROS: no fevers or chills, productive cough, hemoptysis, dysphasia, odynophagia, melena, hematochezia, dysuria, hematuria, rash, seizure activity, orthopnea, PND, pedal edema, claudication. Remaining systems are negative.  Physical Exam: Well-developed morbidly obese in no acute distress.  Skin is warm and dry.  HEENT is  normal.  Neck is supple.  Chest is clear to auscultation with normal expansion.  Cardiovascular exam is irregular Abdominal exam nontender or distended. No masses palpated. Extremities show trace edema. neuro grossly intact  ECG Atrial fibrillation at a rate of 78. Low voltage. Nonspecific ST changes.

## 2013-05-11 NOTE — Patient Instructions (Addendum)
Your physician wants you to follow-up in: 9 MONTHS WITH DR Jens Som You will receive a reminder letter in the mail two months in advance. If you don't receive a letter, please call our office to schedule the follow-up appointment.   Your physician has requested that you have an echocardiogram. Echocardiography is a painless test that uses sound waves to create images of your heart. It provides your doctor with information about the size and shape of your heart and how well your heart's chambers and valves are working. This procedure takes approximately one hour. There are no restrictions for this procedure.   Your physician recommends that you return for lab work WITH ECHO

## 2013-05-24 ENCOUNTER — Other Ambulatory Visit (HOSPITAL_COMMUNITY): Payer: Medicare Other

## 2013-05-26 ENCOUNTER — Ambulatory Visit (HOSPITAL_COMMUNITY): Payer: Medicare Other | Attending: Cardiology | Admitting: Radiology

## 2013-05-26 DIAGNOSIS — I428 Other cardiomyopathies: Secondary | ICD-10-CM

## 2013-05-26 DIAGNOSIS — E119 Type 2 diabetes mellitus without complications: Secondary | ICD-10-CM | POA: Diagnosis not present

## 2013-05-26 DIAGNOSIS — Z950 Presence of cardiac pacemaker: Secondary | ICD-10-CM | POA: Diagnosis not present

## 2013-05-26 DIAGNOSIS — E785 Hyperlipidemia, unspecified: Secondary | ICD-10-CM | POA: Insufficient documentation

## 2013-05-26 DIAGNOSIS — I4891 Unspecified atrial fibrillation: Secondary | ICD-10-CM | POA: Diagnosis not present

## 2013-05-26 NOTE — Progress Notes (Signed)
Echocardiogram performed.  

## 2013-05-30 ENCOUNTER — Telehealth: Payer: Self-pay | Admitting: Cardiology

## 2013-05-30 NOTE — Telephone Encounter (Signed)
Spoke with pt husband, aware of echo results

## 2013-05-30 NOTE — Telephone Encounter (Signed)
New Problem:    Called in because he was prompted to check the patient's mychart account to see her ECHO results but wasn't able to.  Please call back.

## 2013-06-16 ENCOUNTER — Other Ambulatory Visit: Payer: Self-pay | Admitting: *Deleted

## 2013-06-16 MED ORDER — DULOXETINE HCL 60 MG PO CPEP: ORAL_CAPSULE | ORAL | Status: DC

## 2013-06-17 ENCOUNTER — Other Ambulatory Visit: Payer: Self-pay | Admitting: *Deleted

## 2013-06-17 MED ORDER — METOPROLOL TARTRATE 100 MG PO TABS: 100.0000 mg | ORAL_TABLET | Freq: Two times a day (BID) | ORAL | Status: DC

## 2013-06-22 ENCOUNTER — Ambulatory Visit: Payer: Medicare Other | Admitting: Cardiology

## 2013-06-29 ENCOUNTER — Other Ambulatory Visit: Payer: Self-pay

## 2013-06-29 ENCOUNTER — Other Ambulatory Visit: Payer: Self-pay | Admitting: Internal Medicine

## 2013-07-01 ENCOUNTER — Ambulatory Visit: Payer: Medicare Other | Admitting: Internal Medicine

## 2013-07-04 ENCOUNTER — Other Ambulatory Visit: Payer: Self-pay | Admitting: Internal Medicine

## 2013-07-04 NOTE — Telephone Encounter (Signed)
Faxed hardcopy to [facility]

## 2013-07-04 NOTE — Telephone Encounter (Signed)
Done hardcopy to robin  

## 2013-07-18 ENCOUNTER — Other Ambulatory Visit: Payer: Self-pay

## 2013-07-18 MED ORDER — LISINOPRIL 5 MG PO TABS: 5.0000 mg | ORAL_TABLET | Freq: Every day | ORAL | Status: DC

## 2013-08-17 ENCOUNTER — Other Ambulatory Visit (INDEPENDENT_AMBULATORY_CARE_PROVIDER_SITE_OTHER): Payer: Medicare Other

## 2013-08-17 ENCOUNTER — Ambulatory Visit (INDEPENDENT_AMBULATORY_CARE_PROVIDER_SITE_OTHER): Payer: Medicare Other | Admitting: Internal Medicine

## 2013-08-17 ENCOUNTER — Encounter: Payer: Self-pay | Admitting: Internal Medicine

## 2013-08-17 VITALS — BP 110/68 | HR 87 | Temp 97.0°F

## 2013-08-17 DIAGNOSIS — E785 Hyperlipidemia, unspecified: Secondary | ICD-10-CM

## 2013-08-17 DIAGNOSIS — I10 Essential (primary) hypertension: Secondary | ICD-10-CM

## 2013-08-17 DIAGNOSIS — M109 Gout, unspecified: Secondary | ICD-10-CM | POA: Diagnosis not present

## 2013-08-17 DIAGNOSIS — IMO0001 Reserved for inherently not codable concepts without codable children: Secondary | ICD-10-CM

## 2013-08-17 LAB — BASIC METABOLIC PANEL
BUN: 18 mg/dL (ref 6–23)
CO2: 30 mEq/L (ref 19–32)
Calcium: 9.5 mg/dL (ref 8.4–10.5)
Creatinine, Ser: 1.1 mg/dL (ref 0.4–1.2)
Glucose, Bld: 125 mg/dL — ABNORMAL HIGH (ref 70–99)
Potassium: 4.9 mEq/L (ref 3.5–5.1)
Sodium: 137 mEq/L (ref 135–145)

## 2013-08-17 LAB — LIPID PANEL
Cholesterol: 132 mg/dL (ref 0–200)
Triglycerides: 145 mg/dL (ref 0.0–149.0)
VLDL: 29 mg/dL (ref 0.0–40.0)

## 2013-08-17 LAB — HEPATIC FUNCTION PANEL
ALT: 33 U/L (ref 0–35)
AST: 38 U/L — ABNORMAL HIGH (ref 0–37)
Albumin: 3.8 g/dL (ref 3.5–5.2)
Alkaline Phosphatase: 70 U/L (ref 39–117)
Bilirubin, Direct: 0.1 mg/dL (ref 0.0–0.3)
Total Protein: 7.3 g/dL (ref 6.0–8.3)

## 2013-08-17 LAB — HEMOGLOBIN A1C: Hgb A1c MFr Bld: 8.2 % — ABNORMAL HIGH (ref 4.6–6.5)

## 2013-08-17 LAB — URIC ACID: Uric Acid, Serum: 5.2 mg/dL (ref 2.4–7.0)

## 2013-08-17 NOTE — Patient Instructions (Addendum)
Please call to make a follow up appt with Dr Lafe Garin Please continue all other medications as before, and refills have been done if requested. Please have the pharmacy call with any other refills you may need. Please go to the LAB in the Basement (turn left off the elevator) for the tests to be done today You will be contacted by phone if any changes need to be made immediately.  Otherwise, you will receive a letter about your results with an explanation, but please check with MyChart first.  Please remember to sign up for My Chart if you have not done so, as this will be important to you in the future with finding out test results, communicating by private email, and scheduling acute appointments online when needed.  Please return in 6 months, or sooner if needed, with Lab testing done 3-5 days before

## 2013-08-17 NOTE — Progress Notes (Addendum)
Subjective:    Patient ID: Bonnie Clark, female    DOB: 04-28-1941, 72 y.o.   MRN: [id number]  HPI Here to f/u, states has beeen working on DM much more diligently, Overall good compliance with treatment, and good medicine tolerability. Pt denies chest pain, increased sob or doe, wheezing, orthopnea, PND, increased LE swelling, palpitations, dizziness or syncope.  Pt denies polydipsia, polyuria, or low sugar symptoms such as weakness or confusion improved with po intake.  Pt denies new neurological symptoms such as new headache, or facial or extremity weakness or numbness.   Pt states overall good compliance with meds, has been trying to follow lower cholesterol, diabetic diet, with wt overall stable,  but little exercise however. Ambien not working well.  Valium works better. But has klonopin as per last rx.   Has seen endo twice, states [place] not sure if helping as her cbg's are consistently abou t150, asks for a1c, plans to f/u with endo, but has no appt at this time, was rec'd for fu in June 2014 but did not for some reason. No recent gout flares Past Medical History  Diagnosis Date  . Urine incontinence   . Arthritis   . Diabetes mellitus   . Hyperlipidemia   . Hypertension   . Thyroid disease   . Obesity 09/11/2011  . Chronic insomnia 09/11/2011  . Spinal stenosis 09/11/2011  . Fibromyalgia 09/11/2011  . Anxiety and depression 09/11/2011  . Sciatica of right side 09/11/2011  . Gout 09/11/2011  . Gait disorder 12/21/2011  . Atrial fibrillation   . Peripheral edema 06/21/2012  . OAB (overactive bladder) 06/21/2012   Past Surgical History  Procedure Laterality Date  . Thyroidectomy, partial      at 72yo  . Shoulder surgery      right - approx 2005  . Replacement total knee bilateral      right approx 1994, left approx 1999    reports that she has never smoked. She has never used smokeless tobacco. She reports that she does not drink alcohol or use illicit drugs. family history  includes Arthritis in her father and mother; Depression in her mother; Stroke in her sister. No Known Allergies Current Outpatient Prescriptions on File Prior to Visit  Medication Sig Dispense Refill  . allopurinol (ZYLOPRIM) 300 MG tablet Take 1 tablet (300 mg total) by mouth daily. Take 1 by mouth daily  90 tablet  3  . atorvastatin (LIPITOR) 40 MG tablet TAKE ONE TABLET BY MOUTH EVERY DAY  90 tablet  3  . clonazePAM (KLONOPIN) 1 MG tablet Take 1/2 to 1 tab by mouth up to three times per day as needed for anxiety and sleep  90 tablet  2  . colchicine 0.6 MG tablet Take 1 tablet (0.6 mg total) by mouth daily.  90 tablet  3  . dabigatran (PRADAXA) 150 MG CAPS TAKE ONE CAPSULE BY MOUTH EVERY 12 HOURS  60 capsule  4  . DULoxetine (CYMBALTA) 60 MG capsule TAKE ONE CAPSULE BY MOUTH EVERY DAY  90 capsule  2  . glucose blood (ONE TOUCH TEST STRIPS) test strip Use as instructed - check sugars 2 x a day  300 each  11  . lisinopril (PRINIVIL,ZESTRIL) 5 MG tablet Take 1 tablet (5 mg total) by mouth daily. Take 1 by mouth daily  90 tablet  3  . metFORMIN (GLUCOPHAGE-XR) 500 MG 24 hr tablet Take 1 tablet (500 mg total) by mouth 2 (two) times daily.  180 tablet  3  . metoprolol (LOPRESSOR) 100 MG tablet Take 1 tablet (100 mg total) by mouth 2 (two) times daily.  180 tablet  3  . sitaGLIPtin (JANUVIA) 100 MG tablet Take 1 tablet (100 mg total) by mouth daily.  30 tablet  11  . traMADol (ULTRAM) 50 MG tablet TAKE ONE TABLET BY MOUTH EVERY 6 HOURS AS NEEDED FOR PAIN  120 tablet  0  . zolpidem (AMBIEN) 5 MG tablet TAKE ONE TABLET BY MOUTH ONCE DAILY AT BEDTIME AS NEEDED FOR SLEEP  30 tablet  5   No current facility-administered medications on file prior to visit.    Review of Systems  Constitutional: Negative for unexpected weight change, or unusual diaphoresis  HENT: Negative for tinnitus.   Eyes: Negative for photophobia and visual disturbance.  Respiratory: Negative for choking and stridor.    Gastrointestinal: Negative for vomiting and blood in stool.  Genitourinary: Negative for hematuria and decreased urine volume.  Musculoskeletal: Negative for acute joint swelling Skin: Negative for color change and wound.  Neurological: Negative for tremors and numbness other than noted  Psychiatric/Behavioral: Negative for decreased concentration or  hyperactivity.       Objective:   Physical Exam BP 110/68  Pulse 87  Temp(Src) 97 F (36.1 C) (Oral)  SpO2 97% VS noted,  Constitutional: Pt appears well-developed and well-nourished.  HENT: Head: NCAT.  Right Ear: External ear normal.  Left Ear: External ear normal.  Eyes: Conjunctivae and EOM are normal. Pupils are equal, round, and reactive to light.  Neck: Normal range of motion. Neck supple.  Cardiovascular: Normal rate and regular rhythm.   Pulmonary/Chest: Effort normal and breath sounds normal.  Abd:  Soft, NT, non-distended, + BS Neurological: Pt is alert. Not confused  Skin: Skin is warm. No erythema.  Psychiatric: Pt behavior is normal. Thought content normal.     Assessment & Plan:  Quality Measures addressed:  Mammogram:  pt declines and will self-refer Colorectal Cancer screening: pt declines all at this time, including FOBT, flex sig, colonoscopy Diabetes Hgba1c < 8%: pt declines further medication

## 2013-08-21 NOTE — Assessment & Plan Note (Signed)
stable overall by history and exam, recent data reviewed with pt, and pt to continue medical treatment as before,  to f/u any worsening symptoms or concerns Lab Results  Component Value Date   LDLCALC 60 08/17/2013

## 2013-08-21 NOTE — Assessment & Plan Note (Signed)
stable overall by history and exam, recent data reviewed with pt, and pt to continue medical treatment as before,  to f/u any worsening symptoms or concerns BP Readings from Last 3 Encounters:  08/17/13 110/68  05/11/13 120/70  03/31/13 124/72

## 2013-08-21 NOTE — Assessment & Plan Note (Signed)
stable overall by history and exam, recent data reviewed with pt, and pt to f/u with endo as she has planned

## 2013-08-21 NOTE — Assessment & Plan Note (Signed)
For uriac acid level, Please continue all other medications as before

## 2013-08-30 ENCOUNTER — Other Ambulatory Visit: Payer: Self-pay | Admitting: Internal Medicine

## 2013-09-02 ENCOUNTER — Ambulatory Visit (INDEPENDENT_AMBULATORY_CARE_PROVIDER_SITE_OTHER): Payer: Medicare Other | Admitting: Internal Medicine

## 2013-09-02 ENCOUNTER — Encounter: Payer: Self-pay | Admitting: Internal Medicine

## 2013-09-02 VITALS — BP 112/62 | HR 86 | Temp 98.6°F | Resp 12

## 2013-09-02 MED ORDER — METFORMIN HCL ER 500 MG PO TB24: 1000.0000 mg | ORAL_TABLET | Freq: Two times a day (BID) | ORAL | Status: DC

## 2013-09-02 NOTE — Progress Notes (Signed)
Subjective:     Patient ID: Bonnie Clark, female   DOB: Oct 23, 1941, 72 y.o.   MRN: [id number]  HPI Bonnie Clark is a 72 year old woman returning for f/u for DM2, dx 2013, non-insulin-dependent, uncontrolled, with complications (CKD stage 2-3). She is here with her husband, who offers part of the history. Last visit 5 mo ago.    Her last hemoglobin A1c was: Lab Results  Component Value Date   HGBA1C 8.2* 08/17/2013  Previously 10.7% on 02/14/2013, previously 7.3% 10 months ago.   She is on: - metformin XR 500 mg bid - Januvia  100 mg daily - 30 day supply 150$ with insurance, 390$ without (if in the doughnut hole).   She checks her sugars 2 times a day: greatly improved in last 1-2 weeks. - am: 140-150 >>114-130 >> 150-170 - before dinner: 111-138 >> 145 - bedtime: 150 and a sugar of 234 >> not checking Lowest sugar: 111, had hypoglycemia awareness with this. Highest sugar: l234  She improved her diet since last visit. She has sciatica and back pain and this limits her walking. She is mostly in a chair in her dining room during the day and is very hard for her to move around.   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 Her last BUN/creatinine: Lab Results  Component Value Date   BUN 18 08/17/2013   CREATININE 1.1 08/17/2013  Her last eye exam was 2012. Had cataract sx 2 years ago.  She has numbness or tingling in her legs.   Moved from [facility] 05/2011.   Review of Systems Constitutional: no weight gain/loss, no fatigue, no subjective hyperthermia/hypothermia, + poor sleep, nocturia Eyes: no blurry vision, no xerophthalmia ENT: no sore throat, no nodules palpated in throat, no dysphagia/odynophagia, + hoarseness Cardiovascular: no CP/SOB/palpitations/+feet swelling Respiratory: no cough/+ SOB Gastrointestinal: no N/V/D/C Musculoskeletal: + muscle/+joint aches/+ joint swelling Skin: no rashes,  + hair loss Neurological: no tremors/numbness/tingling/dizziness  PMH: Patient has a past medical  history of hypertension, hyperlipidemia, fibromyalgia, small abscess left heel-drained approximately 2 weeks ago, spinal stenosis with low back pain, osteoarthritis, with bilateral knee replacements 15 and 20 years ago, urinary incontinence, history of partial thyroidectomy at 22, gout, atrial fibrillation-on Pradaxa-seen by Dr. Jens Som.  I reviewed pt's medications, allergies, PMH, social hx, family hx and no changes required.  Objective:   Physical Exam BP 112/62  Pulse 86  Temp(Src) 98.6 F (37 C) (Oral)  Resp 12  SpO2 98% Wt Readings from Last 3 Encounters:  06/21/12 333 lb (151.048 kg)  04/21/12 333 lb (151.048 kg)  03/19/12 333 lb (151.048 kg)  Refused weighing. Constitutional: obese, in NAD Eyes: PERRLA, EOMI, no exophthalmos ENT: moist mucous membranes, no thyromegaly, no cervical lymphadenopathy Cardiovascular: RRR, No MRG, nonpitting edema bilateral legs Respiratory: CTA B Gastrointestinal: abdomen soft, NT, ND, BS+ Musculoskeletal: no deformities, strength intact in all 4 Skin: moist, warm, no rashes  Assessment:     1. DM2, non-insulin-dependent, uncontrolled, with complications - CKD stage 3    Plan:     Patient with a short history of DM 2, recently improved after improving her diet. - I suggested some more dietary changes to improve sugars even more.  - will increase metformin XR to 1000 mg twice a day - her last Cr allows an increase in dose - continue Januvia 100 mg daily  - continue to check sugars daily, and I suggested to rotate check times throughout the day since now we only have sugars in am - she  refuses the flu vaccine today - I will see her back in 3 months with her sugar log

## 2013-09-02 NOTE — Patient Instructions (Signed)
Please use the [employer] meter. Increase the Metformin XR to 1000 mg x 2 tablets with dinner. Continue Januvia 100 mg daily in am. Please return in 3 months with your sugar log.

## 2013-09-20 ENCOUNTER — Other Ambulatory Visit: Payer: Self-pay

## 2013-09-20 MED ORDER — TRAMADOL HCL 50 MG PO TABS: ORAL_TABLET | ORAL | Status: DC

## 2013-09-20 NOTE — Telephone Encounter (Signed)
Done hardcopy to robin  

## 2013-09-20 NOTE — Telephone Encounter (Signed)
Faxed hardcopy to [employer] and informed the patient.

## 2013-10-02 ENCOUNTER — Other Ambulatory Visit: Payer: Self-pay | Admitting: Internal Medicine

## 2013-10-09 ENCOUNTER — Other Ambulatory Visit: Payer: Self-pay | Admitting: Internal Medicine

## 2013-10-10 ENCOUNTER — Other Ambulatory Visit: Payer: Self-pay | Admitting: Internal Medicine

## 2013-10-11 ENCOUNTER — Other Ambulatory Visit: Payer: Self-pay | Admitting: Sports Medicine

## 2013-10-11 ENCOUNTER — Ambulatory Visit (INDEPENDENT_AMBULATORY_CARE_PROVIDER_SITE_OTHER): Payer: Medicare Other | Admitting: Sports Medicine

## 2013-10-11 ENCOUNTER — Ambulatory Visit (INDEPENDENT_AMBULATORY_CARE_PROVIDER_SITE_OTHER): Payer: Medicare Other

## 2013-10-11 ENCOUNTER — Encounter: Payer: Self-pay | Admitting: Sports Medicine

## 2013-10-11 ENCOUNTER — Ambulatory Visit: Payer: Medicare Other

## 2013-10-11 VITALS — BP 149/80 | HR 89

## 2013-10-11 DIAGNOSIS — M160 Bilateral primary osteoarthritis of hip: Secondary | ICD-10-CM | POA: Insufficient documentation

## 2013-10-11 DIAGNOSIS — M169 Osteoarthritis of hip, unspecified: Secondary | ICD-10-CM

## 2013-10-11 DIAGNOSIS — M25859 Other specified joint disorders, unspecified hip: Secondary | ICD-10-CM

## 2013-10-11 DIAGNOSIS — M25551 Pain in right hip: Secondary | ICD-10-CM

## 2013-10-11 DIAGNOSIS — M51379 Other intervertebral disc degeneration, lumbosacral region without mention of lumbar back pain or lower extremity pain: Secondary | ICD-10-CM | POA: Diagnosis not present

## 2013-10-11 DIAGNOSIS — E669 Obesity, unspecified: Secondary | ICD-10-CM

## 2013-10-11 DIAGNOSIS — M25552 Pain in left hip: Secondary | ICD-10-CM

## 2013-10-11 DIAGNOSIS — M5136 Other intervertebral disc degeneration, lumbar region: Secondary | ICD-10-CM | POA: Insufficient documentation

## 2013-10-11 DIAGNOSIS — M25559 Pain in unspecified hip: Secondary | ICD-10-CM | POA: Diagnosis not present

## 2013-10-11 DIAGNOSIS — M5137 Other intervertebral disc degeneration, lumbosacral region: Secondary | ICD-10-CM

## 2013-10-11 DIAGNOSIS — M51369 Other intervertebral disc degeneration, lumbar region without mention of lumbar back pain or lower extremity pain: Secondary | ICD-10-CM

## 2013-10-11 DIAGNOSIS — Z0000 Encounter for general adult medical examination without abnormal findings: Secondary | ICD-10-CM

## 2013-10-11 MED ORDER — TRAMADOL-ACETAMINOPHEN 37.5-325 MG PO TABS: 1.0000 | ORAL_TABLET | Freq: Four times a day (QID) | ORAL | Status: DC | PRN

## 2013-10-11 MED ORDER — MELOXICAM 15 MG PO TABS: ORAL_TABLET | ORAL | Status: DC

## 2013-10-11 NOTE — Assessment & Plan Note (Signed)
I would like Bonnie Clark to talk to Dr. Ivan Anchors regarding her medical problems, and establishing medical care at this practice.

## 2013-10-11 NOTE — Telephone Encounter (Signed)
Benzo Done hardcopy to [facility]  allupurinol done erx

## 2013-10-11 NOTE — Assessment & Plan Note (Addendum)
This pleasant 72 year old female does have fairly moderate degenerative changes in her spine. She also has significant degenerative changes on exam. I think that her hip degenerative changes may contribute to her inability to walk. I would like to focus on her x-rays, Ultracet, formal physical therapy for a few weeks, and then probably intra-articular injections. I like to see her back after about 3 weeks to consider an injection.  Considering her degree of osteoarthritis on x-rays, I would like her to come back early for consideration of a bilateral femoral acetabular joint injection to help facilitate physical therapy.

## 2013-10-11 NOTE — Progress Notes (Signed)
  Subjective:    CC: Establish care.   HPI: This pleasant and talkative 72 year old female comes in with a multitude of complaints. Primarily her issue is back pain. She has multilevel degenerative disc disease, she has had epidural injections, unclear level, which were ineffective, she has never had physical therapy for her spine. She has seen a spine surgeon who recommended operative intervention, but she was not cleared by her cardiologist for this. Pain is localized in her back, and her legs are weak and to the point where she is unable to walk and is essentially wheelchair-bound. She also has pain in both groins, and notes her weakness is predominantly with flexion of the hips. Symptoms are moderate, persistent.  Diabetes, hypertension, hyperlipidemia, and other medical issues: She does desire to switch her and her husband's primary care to this office, I have recommended that they consider Dr. Ivan Anchors for their primary care.  Past medical history, Surgical history, Family history not pertinant except as noted below, Social history, Allergies, and medications have been entered into the medical record, reviewed, and no changes needed.   Review of Systems: No headache, visual changes, nausea, vomiting, diarrhea, constipation, dizziness, abdominal pain, skin rash, fevers, chills, night sweats, swollen lymph nodes, weight loss, chest pain, body aches, joint swelling, muscle aches, shortness of breath, mood changes, visual or auditory hallucinations.  Objective:    General: Well Developed, well nourished, and in no acute distress.  Neuro: Alert and oriented x3, extra-ocular muscles intact, sensation grossly intact.  HEENT: Normocephalic, atraumatic, pupils equal round reactive to light, neck supple, no masses, no lymphadenopathy, thyroid nonpalpable.  Skin: Warm and dry, no rashes noted.  Cardiac: Regular rate and rhythm, no murmurs rubs or gallops.  Respiratory: Clear to auscultation bilaterally.  Not using accessory muscles, speaking in full sentences.  Abdominal: Soft, nontender, nondistended, positive bowel sounds, no masses, no organomegaly.  Left and right hip: Approximately 10 of internal rotation with reproduction of pain in the groin radiating to the back. Good strength to flexion, abduction, adduction in the hips. Good strength to flexion and extension at the knees, as well as ankles.  Hip x-rays show severe degenerative changes in the right hip and mild to moderate degenerative changes in the left hip.  Impression and Recommendations:    The patient was counselled, risk factors were discussed, anticipatory guidance given.

## 2013-10-11 NOTE — Telephone Encounter (Signed)
Faxed script back to [facility].../lmb 

## 2013-10-11 NOTE — Assessment & Plan Note (Signed)
It sounds like some of her symptoms may be coming from the lumbar spine. Surgery is not an option, it sounds as though she has had some epidurals are not effective, they have no idea what level these were performed at.

## 2013-10-18 ENCOUNTER — Encounter: Payer: Self-pay | Admitting: Family Medicine

## 2013-10-18 ENCOUNTER — Ambulatory Visit (INDEPENDENT_AMBULATORY_CARE_PROVIDER_SITE_OTHER): Payer: Medicare Other | Admitting: Family Medicine

## 2013-10-18 VITALS — BP 123/76 | HR 61 | Wt 330.0 lb

## 2013-10-18 DIAGNOSIS — E785 Hyperlipidemia, unspecified: Secondary | ICD-10-CM

## 2013-10-18 DIAGNOSIS — I4891 Unspecified atrial fibrillation: Secondary | ICD-10-CM

## 2013-10-18 DIAGNOSIS — F329 Major depressive disorder, single episode, unspecified: Secondary | ICD-10-CM

## 2013-10-18 DIAGNOSIS — M109 Gout, unspecified: Secondary | ICD-10-CM

## 2013-10-18 DIAGNOSIS — I10 Essential (primary) hypertension: Secondary | ICD-10-CM

## 2013-10-18 MED ORDER — SAXAGLIPTIN-METFORMIN ER 2.5-1000 MG PO TB24: ORAL_TABLET | ORAL | Status: DC

## 2013-10-18 NOTE — Addendum Note (Signed)
Addended by: Laren Boom on: 10/18/2013 04:57 PM   Modules accepted: Level of Service

## 2013-10-18 NOTE — Progress Notes (Signed)
CC: Bonnie Clark is a 72 y.o. female is here for Establish Care   Subjective: HPI:  Pleasant former [place] resident here to establish care  Patient reports a history of type II diabetes is unsure when this was initially diagnosed. Approximately 5 months ago her metformin was increased from 1 g a day now 2 g a day she continues on Januvia 100 mg a day. Fasting blood sugars have consistently been between 150-180 for the past 2 weeks. A1c 8 in September. Denies polyuria polyphasia or polydipsia nor vision loss  Reports a history of depression that stems from stress from her sons. Symptoms have improved since starting the Loxitane many years ago. Current symptoms are described as subjective depression mild in severity not interfering with quality of life.  Reports a history of hyperlipidemia currently taking Lipitor 40 mg daily without right upper quadrant pain and myalgias. Cholesterol was checked in September and was fantastic all around. She reports less than optimal diet. She reports physical activity due to chronic leg pain  History of hypertension: No outside blood pressures to report. Continues on metoprolol and denies headache, motor sensory disturbances nor chest pain  History of gout predominately in both feet continues on allopurinol, colchicine she's unsure when her most recent flare was she believes it was over a year ago. Denies any new joint swelling redness or warmth  History of atrial fibrillation found in an astigmatic state approximately 9 months ago. Has been on metoprolol and pradaxa without fatigue bruising, nor bleeding or clotting abnormalities. Denies any motor or sensory disturbances or rapid heartbeat  Review of Systems - General ROS: negative for - chills, fever, night sweats, weight gain or weight loss Ophthalmic ROS: negative for - decreased vision Psychological ROS: negative for -uncontrolled anxiety or depression ENT ROS: negative for - hearing change, nasal  congestion, tinnitus or allergies Hematological and Lymphatic ROS: negative for - bleeding problems, bruising or swollen lymph nodes Breast ROS: negative Respiratory ROS: no cough, shortness of breath, or wheezing Cardiovascular ROS: no chest pain or dyspnea on exertion Gastrointestinal ROS: no abdominal pain, change in bowel habits, or black or bloody stools Genito-Urinary ROS: negative for - genital discharge, genital ulcers, incontinence or abnormal bleeding from genitals Neurological ROS: negative for - headaches or memory loss Dermatological ROS: negative for lumps, mole changes, rash and skin lesion changes  Past Medical History  Diagnosis Date  . Urine incontinence   . Arthritis   . Diabetes mellitus   . Hyperlipidemia   . Hypertension   . Thyroid disease   . Obesity 09/11/2011  . Chronic insomnia 09/11/2011  . Spinal stenosis 09/11/2011  . Fibromyalgia 09/11/2011  . Anxiety and depression 09/11/2011  . Sciatica of right side 09/11/2011  . Gout 09/11/2011  . Gait disorder 12/21/2011  . Atrial fibrillation   . Peripheral edema 06/21/2012  . OAB (overactive bladder) 06/21/2012     Family History  Problem Relation Age of Onset  . Arthritis Mother   . Depression Mother   . Arthritis Father   . Stroke Sister      History  Substance Use Topics  . Smoking status: Never Smoker   . Smokeless tobacco: Never Used  . Alcohol Use: No     Objective: Filed Vitals:   10/18/13 1426  BP: 123/76  Pulse: 61    General: Alert and Oriented, No Acute Distress HEENT: Pupils equal, round, reactive to light. Conjunctivae clear. Moist mucous membranes pharynx unremarkable neck supple without palpable masses Lungs:  Clear to auscultation bilaterally, no wheezing/ronchi/rales.  Comfortable work of breathing. Good air movement. Cardiac: Regular rate and rhythm. Normal S1/S2.  No murmurs, rubs, nor gallops.   Abdomen: Obese soft nontender Extremities: No peripheral edema.  Strong  peripheral pulses.  Mental Status: No depression, anxiety, nor agitation. Skin: Warm and dry.  Assessment & Plan: Bonnie Clark was seen today for establish care.  Diagnoses and associated orders for this visit:  Depression  Type II or unspecified type diabetes mellitus without mention of complication, uncontrolled  Hyperlipidemia  Hypertension  Gout  Atrial fibrillation  Other Orders - Saxagliptin-Metformin 2.03-999 MG TB24; Two by mouth with dinner every night    Depression: Controlled continue duloxetine Type 2 diabetes: Uncontrolled stop Januvia and metformin we'll now try Kombyglize XR keep fasting blood sugars return in 4 weeks for review Hyperlipidemia: Controlled continue Lipitor Hypertension: Controlled continue metoprolol Gout: Controlled continue current medications Atrial fibrillation: Controlled continue pradaxa and metoprolol  45 minutes spent face-to-face during visit today of which at least 50% was counseling or coordinating care regarding depression, diabetes, hyperlipidemia, hypertension, gout, atrial fibrillation.   Return in about 4 weeks (around 11/15/2013).

## 2013-10-26 ENCOUNTER — Ambulatory Visit: Payer: Medicare Other | Admitting: Physical Therapy

## 2013-10-26 DIAGNOSIS — M6281 Muscle weakness (generalized): Secondary | ICD-10-CM

## 2013-10-26 DIAGNOSIS — M25559 Pain in unspecified hip: Secondary | ICD-10-CM

## 2013-10-26 DIAGNOSIS — M25659 Stiffness of unspecified hip, not elsewhere classified: Secondary | ICD-10-CM

## 2013-10-26 DIAGNOSIS — R262 Difficulty in walking, not elsewhere classified: Secondary | ICD-10-CM

## 2013-10-31 ENCOUNTER — Encounter: Payer: Medicare Other | Admitting: Physical Therapy

## 2013-10-31 DIAGNOSIS — M25659 Stiffness of unspecified hip, not elsewhere classified: Secondary | ICD-10-CM

## 2013-10-31 DIAGNOSIS — M6281 Muscle weakness (generalized): Secondary | ICD-10-CM

## 2013-10-31 DIAGNOSIS — M25559 Pain in unspecified hip: Secondary | ICD-10-CM

## 2013-10-31 DIAGNOSIS — R262 Difficulty in walking, not elsewhere classified: Secondary | ICD-10-CM

## 2013-11-01 ENCOUNTER — Encounter: Payer: Self-pay | Admitting: Sports Medicine

## 2013-11-01 ENCOUNTER — Telehealth: Payer: Self-pay | Admitting: Family Medicine

## 2013-11-01 ENCOUNTER — Telehealth: Payer: Self-pay | Admitting: Cardiology

## 2013-11-01 ENCOUNTER — Ambulatory Visit (INDEPENDENT_AMBULATORY_CARE_PROVIDER_SITE_OTHER): Payer: Medicare Other | Admitting: Sports Medicine

## 2013-11-01 ENCOUNTER — Other Ambulatory Visit: Payer: Self-pay | Admitting: *Deleted

## 2013-11-01 VITALS — BP 143/79 | HR 70

## 2013-11-01 DIAGNOSIS — M51379 Other intervertebral disc degeneration, lumbosacral region without mention of lumbar back pain or lower extremity pain: Secondary | ICD-10-CM | POA: Diagnosis not present

## 2013-11-01 DIAGNOSIS — M1610 Unilateral primary osteoarthritis, unspecified hip: Secondary | ICD-10-CM | POA: Diagnosis not present

## 2013-11-01 DIAGNOSIS — M5137 Other intervertebral disc degeneration, lumbosacral region: Secondary | ICD-10-CM

## 2013-11-01 DIAGNOSIS — M160 Bilateral primary osteoarthritis of hip: Secondary | ICD-10-CM

## 2013-11-01 DIAGNOSIS — M169 Osteoarthritis of hip, unspecified: Secondary | ICD-10-CM

## 2013-11-01 DIAGNOSIS — M51369 Other intervertebral disc degeneration, lumbar region without mention of lumbar back pain or lower extremity pain: Secondary | ICD-10-CM

## 2013-11-01 DIAGNOSIS — M5136 Other intervertebral disc degeneration, lumbar region: Secondary | ICD-10-CM

## 2013-11-01 MED ORDER — SAXAGLIPTIN-METFORMIN ER 2.5-1000 MG PO TB24: ORAL_TABLET | ORAL | Status: DC

## 2013-11-01 NOTE — Telephone Encounter (Signed)
Received request from Nurse fax box, documents faxed for surgical clearance. To: [facility] Fax number: [phone] Attention: 11/01/13/KM

## 2013-11-01 NOTE — Telephone Encounter (Signed)
rx sent and pt has an appt 12/19

## 2013-11-01 NOTE — Assessment & Plan Note (Signed)
Overall pain-free, working hard with physical therapy. I do think once we get some of her strength back, she will be able to walk much better.

## 2013-11-01 NOTE — Telephone Encounter (Signed)
rx clarification

## 2013-11-01 NOTE — Telephone Encounter (Signed)
Patient states you had given her some diabetes medication to try and she will be running out of it soon. Will you go ahead and call her script in for this med? Call her and let her know so she will know to pick it up at pharmacy. Thanks

## 2013-11-01 NOTE — Assessment & Plan Note (Signed)
Right-sided radicular symptoms when laying flat. Continue physical therapy. Overall relatively well controlled with Mobic and occasional Ultracet. Return in 2 months.

## 2013-11-01 NOTE — Progress Notes (Signed)
  Subjective:    CC: Follow up  HPI: Bonnie Clark comes back regarding her hip and her back, she has hip osteoarthritis but essentially is pain-free with Mobic and occasional Ultracet. She has been doing physical therapy, she has only had 2 visits so far but is doing well.  Regarding her back pain she still does get right-sided radicular symptoms when laying flat, she is not yet ready to pursue advanced for interventional treatment.  Past medical history, Surgical history, Family history not pertinant except as noted below, Social history, Allergies, and medications have been entered into the medical record, reviewed, and no changes needed.   Review of Systems: No fevers, chills, night sweats, weight loss, chest pain, or shortness of breath.   Objective:    General: Well Developed, well nourished, and in no acute distress.  Neuro: Alert and oriented x3, extra-ocular muscles intact, sensation grossly intact.  HEENT: Normocephalic, atraumatic, pupils equal round reactive to light, neck supple, no masses, no lymphadenopathy, thyroid nonpalpable.  Skin: Warm and dry, no rashes. Cardiac: Regular rate and rhythm, no murmurs rubs or gallops, no lower extremity edema.  Respiratory: Clear to auscultation bilaterally. Not using accessory muscles, speaking in full sentences. Bilateral hips: Internal rotation is very limited, right worse than left. No pain however.  Impression and Recommendations:   I spent 40 minutes with this patient, greater than 50% was face-to-face time counseling regarding lumbar radiculitis as well as hip osteoarthritis.

## 2013-11-02 ENCOUNTER — Telehealth: Payer: Self-pay | Admitting: *Deleted

## 2013-11-02 DIAGNOSIS — R262 Difficulty in walking, not elsewhere classified: Secondary | ICD-10-CM

## 2013-11-02 DIAGNOSIS — M25559 Pain in unspecified hip: Secondary | ICD-10-CM

## 2013-11-02 DIAGNOSIS — M25659 Stiffness of unspecified hip, not elsewhere classified: Secondary | ICD-10-CM

## 2013-11-02 DIAGNOSIS — M6281 Muscle weakness (generalized): Secondary | ICD-10-CM

## 2013-11-02 NOTE — Telephone Encounter (Signed)
PA approved for Kombiglyze until 11/23/14.

## 2013-11-04 ENCOUNTER — Encounter: Payer: Medicare Other | Admitting: Physical Therapy

## 2013-11-04 DIAGNOSIS — M25559 Pain in unspecified hip: Secondary | ICD-10-CM

## 2013-11-04 DIAGNOSIS — M6281 Muscle weakness (generalized): Secondary | ICD-10-CM

## 2013-11-04 DIAGNOSIS — M25659 Stiffness of unspecified hip, not elsewhere classified: Secondary | ICD-10-CM

## 2013-11-04 DIAGNOSIS — R262 Difficulty in walking, not elsewhere classified: Secondary | ICD-10-CM

## 2013-11-07 ENCOUNTER — Encounter (INDEPENDENT_AMBULATORY_CARE_PROVIDER_SITE_OTHER): Payer: Medicare Other | Admitting: Physical Therapy

## 2013-11-07 DIAGNOSIS — R262 Difficulty in walking, not elsewhere classified: Secondary | ICD-10-CM

## 2013-11-07 DIAGNOSIS — M25559 Pain in unspecified hip: Secondary | ICD-10-CM

## 2013-11-07 DIAGNOSIS — M6281 Muscle weakness (generalized): Secondary | ICD-10-CM

## 2013-11-07 DIAGNOSIS — M25659 Stiffness of unspecified hip, not elsewhere classified: Secondary | ICD-10-CM

## 2013-11-09 ENCOUNTER — Encounter: Payer: Medicare Other | Admitting: Physical Therapy

## 2013-11-10 ENCOUNTER — Encounter: Payer: Medicare Other | Admitting: Physical Therapy

## 2013-11-11 ENCOUNTER — Encounter: Payer: Medicare Other | Admitting: Physical Therapy

## 2013-11-11 ENCOUNTER — Encounter (INDEPENDENT_AMBULATORY_CARE_PROVIDER_SITE_OTHER): Payer: Medicare Other | Admitting: Physical Therapy

## 2013-11-11 ENCOUNTER — Encounter: Payer: Self-pay | Admitting: Family Medicine

## 2013-11-11 ENCOUNTER — Ambulatory Visit (INDEPENDENT_AMBULATORY_CARE_PROVIDER_SITE_OTHER): Payer: Medicare Other | Admitting: Family Medicine

## 2013-11-11 VITALS — BP 145/83 | HR 86

## 2013-11-11 DIAGNOSIS — R262 Difficulty in walking, not elsewhere classified: Secondary | ICD-10-CM

## 2013-11-11 DIAGNOSIS — M25559 Pain in unspecified hip: Secondary | ICD-10-CM

## 2013-11-11 DIAGNOSIS — I10 Essential (primary) hypertension: Secondary | ICD-10-CM | POA: Diagnosis not present

## 2013-11-11 DIAGNOSIS — M25659 Stiffness of unspecified hip, not elsewhere classified: Secondary | ICD-10-CM

## 2013-11-11 DIAGNOSIS — M6281 Muscle weakness (generalized): Secondary | ICD-10-CM

## 2013-11-11 MED ORDER — LISINOPRIL 10 MG PO TABS: 10.0000 mg | ORAL_TABLET | Freq: Every day | ORAL | Status: DC

## 2013-11-11 NOTE — Progress Notes (Signed)
CC: Bonnie Clark is a 72 y.o. female is here for Diabetes   Subjective: HPI:  Followup type 2 diabetes: At her last visit we switched diabetic medications now starting kombiglyze she reports no known side effects in fact diarrhea has significantly improved since the switch. She's taking no other anti-hyperglycemics.  Fasting blood sugars are ranging from 113-150. No two-hour postprandials to report. Denies hypoglycemic episodes. Denies polyuria polyphasia or polydipsia nor vision loss.  Followup essential hypertension: No outside blood pressures to report three out of four for her last visit in our office she has been stage I hypertension. Continues on metoprolol and lisinopril. Denies chest pain shortness of breath orthopnea peripheral edema nor motor or sensory disturbances   Review Of Systems Outlined In HPI  Past Medical History  Diagnosis Date  . Urine incontinence   . Arthritis   . Diabetes mellitus   . Hyperlipidemia   . Hypertension   . Thyroid disease   . Obesity 09/11/2011  . Chronic insomnia 09/11/2011  . Spinal stenosis 09/11/2011  . Fibromyalgia 09/11/2011  . Anxiety and depression 09/11/2011  . Sciatica of right side 09/11/2011  . Gout 09/11/2011  . Gait disorder 12/21/2011  . Atrial fibrillation   . Peripheral edema 06/21/2012  . OAB (overactive bladder) 06/21/2012     Family History  Problem Relation Age of Onset  . Arthritis Mother   . Depression Mother   . Arthritis Father   . Stroke Sister      History  Substance Use Topics  . Smoking status: Never Smoker   . Smokeless tobacco: Never Used  . Alcohol Use: No     Objective: Filed Vitals:   11/11/13 1421  BP: 145/83  Pulse: 86    General: Alert and Oriented, No Acute Distress HEENT: Pupils equal, round, reactive to light. Conjunctivae clear.  Moist mucous membranes pharynx unremarkable Lungs: Clear to auscultation bilaterally, no wheezing/ronchi/rales.  Comfortable work of breathing. Good air  movement. Cardiac: Regular rate and rhythm. Normal S1/S2.  No murmurs, rubs, nor gallops.   Abdomen: Obese and soft Extremities: No peripheral edema.  Strong peripheral pulses.  Mental Status: No depression, anxiety, nor agitation. Requires frequent redirection for tangential thought process  Skin: Warm and dry.  Assessment & Plan: Bonnie Clark was seen today for diabetes.  Diagnoses and associated orders for this visit:  Type II or unspecified type diabetes mellitus without mention of complication, uncontrolled - Hemoglobin A1c  Hypertension - lisinopril (PRINIVIL,ZESTRIL) 10 MG tablet; Take 1 tablet (10 mg total) by mouth daily. Take 1 by mouth daily    Type 2 diabetes: Uncontrolled based on fasting blood sugars she's due for A1c depending on result will modify antihyperglycemic regimen. Hypertension: Uncontrolled chronic condition increasing lisinopril 40 minutes spent face-to-face during visit today of which at least 50% was counseling or coordinating care regarding type 2 diabetes and hypertension.    Return in about 3 months (around 02/09/2014).

## 2013-11-12 LAB — HEMOGLOBIN A1C: Mean Plasma Glucose: 163 mg/dL — ABNORMAL HIGH (ref <117)

## 2013-11-14 ENCOUNTER — Encounter (INDEPENDENT_AMBULATORY_CARE_PROVIDER_SITE_OTHER): Payer: Medicare Other | Admitting: Physical Therapy

## 2013-11-14 ENCOUNTER — Telehealth: Payer: Self-pay | Admitting: Family Medicine

## 2013-11-14 DIAGNOSIS — E119 Type 2 diabetes mellitus without complications: Secondary | ICD-10-CM | POA: Diagnosis not present

## 2013-11-14 DIAGNOSIS — R262 Difficulty in walking, not elsewhere classified: Secondary | ICD-10-CM

## 2013-11-14 DIAGNOSIS — H26499 Other secondary cataract, unspecified eye: Secondary | ICD-10-CM | POA: Diagnosis not present

## 2013-11-14 DIAGNOSIS — Z9849 Cataract extraction status, unspecified eye: Secondary | ICD-10-CM | POA: Diagnosis not present

## 2013-11-14 DIAGNOSIS — H02839 Dermatochalasis of unspecified eye, unspecified eyelid: Secondary | ICD-10-CM | POA: Diagnosis not present

## 2013-11-14 DIAGNOSIS — H526 Other disorders of refraction: Secondary | ICD-10-CM | POA: Diagnosis not present

## 2013-11-14 DIAGNOSIS — M6281 Muscle weakness (generalized): Secondary | ICD-10-CM

## 2013-11-14 DIAGNOSIS — M25659 Stiffness of unspecified hip, not elsewhere classified: Secondary | ICD-10-CM

## 2013-11-14 DIAGNOSIS — H43819 Vitreous degeneration, unspecified eye: Secondary | ICD-10-CM | POA: Diagnosis not present

## 2013-11-14 DIAGNOSIS — M25559 Pain in unspecified hip: Secondary | ICD-10-CM

## 2013-11-14 MED ORDER — GLIPIZIDE ER 5 MG PO TB24: 5.0000 mg | ORAL_TABLET | Freq: Every day | ORAL | Status: DC

## 2013-11-14 NOTE — Telephone Encounter (Signed)
Bonnie Clark,  Will you please let Bonnie Clark know that her A1c was 7.3 down from 8.2.  It appears the Kombiglyze is helping.  Our goal is to get the A1c down to below 7 therefore I'd recommend she start an additional diabetic medication called glipizide which I've sent to her [facility].  F/U in about 3 months.

## 2013-11-14 NOTE — Telephone Encounter (Signed)
Left detailed message on vm.

## 2013-11-15 ENCOUNTER — Encounter: Payer: Medicare Other | Admitting: Physical Therapy

## 2013-11-16 ENCOUNTER — Encounter: Payer: Medicare Other | Admitting: Physical Therapy

## 2013-11-21 ENCOUNTER — Encounter: Payer: Medicare Other | Admitting: Physical Therapy

## 2013-11-21 ENCOUNTER — Telehealth: Payer: Self-pay | Admitting: *Deleted

## 2013-11-21 NOTE — Telephone Encounter (Signed)
Pt's spouse called and states the diabetes medication is causing diarrhea. I called back and left a message to verify which medication he is referring to because there are two meds listed on her chart.

## 2013-11-22 NOTE — Telephone Encounter (Signed)
Called and spoke with spouse and he states pt has been using immodium and he will callback to let us know what his wife decides. Pt also apologized for being rude on the vm

## 2013-11-22 NOTE — Telephone Encounter (Signed)
My recommendation would be then to stop glipizide, diarrhea improves in 1-2 days after stopping this then it would be fair to assume that this is indeed causing diarrhea. Please update [place] with how her bowels respond a few days after stopping this medication please

## 2013-11-22 NOTE — Telephone Encounter (Signed)
Pt's spouse called back upset because he states he specifically said the GLIPIZIDE was causing the diarrhea and he couldn't call back until after 5 to clarify and now we are closed so he couldn't get an answer the same day he called. On the message he left yesterday pt wasn't clear as to which medication was causing the diarrhea. On the vm it sounded like he started to say glipizide then he said kombliglize then he just simply stated the "the diabetic medication and you have that on file." So this is why I called to clarify.

## 2013-11-23 ENCOUNTER — Encounter (INDEPENDENT_AMBULATORY_CARE_PROVIDER_SITE_OTHER): Payer: Medicare Other

## 2013-11-23 DIAGNOSIS — M25559 Pain in unspecified hip: Secondary | ICD-10-CM

## 2013-11-23 DIAGNOSIS — M25659 Stiffness of unspecified hip, not elsewhere classified: Secondary | ICD-10-CM

## 2013-11-23 DIAGNOSIS — M6281 Muscle weakness (generalized): Secondary | ICD-10-CM

## 2013-11-23 DIAGNOSIS — R262 Difficulty in walking, not elsewhere classified: Secondary | ICD-10-CM

## 2013-11-28 ENCOUNTER — Encounter (INDEPENDENT_AMBULATORY_CARE_PROVIDER_SITE_OTHER): Payer: Medicare Other | Admitting: Physical Therapy

## 2013-11-28 DIAGNOSIS — R262 Difficulty in walking, not elsewhere classified: Secondary | ICD-10-CM

## 2013-11-28 DIAGNOSIS — M25559 Pain in unspecified hip: Secondary | ICD-10-CM

## 2013-11-28 DIAGNOSIS — M25659 Stiffness of unspecified hip, not elsewhere classified: Secondary | ICD-10-CM

## 2013-12-01 ENCOUNTER — Encounter: Payer: Medicare Other | Admitting: Physical Therapy

## 2013-12-05 ENCOUNTER — Encounter (INDEPENDENT_AMBULATORY_CARE_PROVIDER_SITE_OTHER): Payer: Medicare Other | Admitting: Physical Therapy

## 2013-12-05 DIAGNOSIS — M6281 Muscle weakness (generalized): Secondary | ICD-10-CM

## 2013-12-05 DIAGNOSIS — M25559 Pain in unspecified hip: Secondary | ICD-10-CM

## 2013-12-05 DIAGNOSIS — M25659 Stiffness of unspecified hip, not elsewhere classified: Secondary | ICD-10-CM

## 2013-12-05 DIAGNOSIS — R262 Difficulty in walking, not elsewhere classified: Secondary | ICD-10-CM

## 2013-12-07 ENCOUNTER — Encounter (INDEPENDENT_AMBULATORY_CARE_PROVIDER_SITE_OTHER): Payer: Medicare Other

## 2013-12-07 DIAGNOSIS — M25659 Stiffness of unspecified hip, not elsewhere classified: Secondary | ICD-10-CM

## 2013-12-07 DIAGNOSIS — M25559 Pain in unspecified hip: Secondary | ICD-10-CM

## 2013-12-07 DIAGNOSIS — R262 Difficulty in walking, not elsewhere classified: Secondary | ICD-10-CM

## 2013-12-07 DIAGNOSIS — M6281 Muscle weakness (generalized): Secondary | ICD-10-CM

## 2013-12-09 ENCOUNTER — Ambulatory Visit: Payer: Medicare Other | Admitting: Internal Medicine

## 2013-12-09 ENCOUNTER — Encounter (INDEPENDENT_AMBULATORY_CARE_PROVIDER_SITE_OTHER): Payer: Medicare Other | Admitting: Physical Therapy

## 2013-12-09 DIAGNOSIS — M6281 Muscle weakness (generalized): Secondary | ICD-10-CM

## 2013-12-09 DIAGNOSIS — M25659 Stiffness of unspecified hip, not elsewhere classified: Secondary | ICD-10-CM

## 2013-12-09 DIAGNOSIS — R262 Difficulty in walking, not elsewhere classified: Secondary | ICD-10-CM

## 2013-12-09 DIAGNOSIS — M25559 Pain in unspecified hip: Secondary | ICD-10-CM

## 2013-12-12 ENCOUNTER — Encounter (INDEPENDENT_AMBULATORY_CARE_PROVIDER_SITE_OTHER): Payer: Medicare Other | Admitting: Physical Therapy

## 2013-12-12 DIAGNOSIS — M25559 Pain in unspecified hip: Secondary | ICD-10-CM

## 2013-12-12 DIAGNOSIS — M6281 Muscle weakness (generalized): Secondary | ICD-10-CM

## 2013-12-12 DIAGNOSIS — R262 Difficulty in walking, not elsewhere classified: Secondary | ICD-10-CM

## 2013-12-12 DIAGNOSIS — M25659 Stiffness of unspecified hip, not elsewhere classified: Secondary | ICD-10-CM

## 2013-12-16 ENCOUNTER — Encounter: Payer: Medicare Other | Admitting: Physical Therapy

## 2013-12-19 ENCOUNTER — Encounter: Payer: Medicare Other | Admitting: Physical Therapy

## 2013-12-20 ENCOUNTER — Encounter: Payer: Medicare Other | Admitting: Physical Therapy

## 2013-12-21 ENCOUNTER — Telehealth: Payer: Self-pay | Admitting: *Deleted

## 2013-12-21 NOTE — Telephone Encounter (Signed)
Pt left a message on my vm that I had called wanting pt's rx [employer] # ..ibuprofen didn't call this patient this am nor have I talked to her spouse recently. I am unaware of what he talking about. I dont see anything in the chart where anyone else from our office has called him either. I left him a message that I was unaware of what he was referring to and I didn't call him this am or at any time recently.

## 2013-12-21 NOTE — Telephone Encounter (Signed)
Mr. Czajkowski came into the office with a note re a [employer] rx card. Apparently his insurance is not paying for one of this patients medications and a prior has been initiated. In the note the pt states he cannot find her card and they are issuing a new card. As soon as it arrives then he will contact us. I am sending his note to scan

## 2014-01-05 ENCOUNTER — Other Ambulatory Visit: Payer: Self-pay | Admitting: Family Medicine

## 2014-01-10 ENCOUNTER — Ambulatory Visit: Payer: Medicare Other | Admitting: Family Medicine

## 2014-01-11 ENCOUNTER — Telehealth: Payer: Self-pay | Admitting: *Deleted

## 2014-01-11 ENCOUNTER — Ambulatory Visit: Payer: Medicare Other | Admitting: Family Medicine

## 2014-01-11 MED ORDER — SAXAGLIPTIN-METFORMIN ER 2.5-1000 MG PO TB24: ORAL_TABLET | ORAL | Status: DC

## 2014-01-11 NOTE — Telephone Encounter (Signed)
Pt's spouse came in and states pt is out of medication. He was advised that we are still waiting on the [facility] ID so that we can move forward on the prior auth. Pt does not have a card but he did have [employer] ID # which I gave to [employer] who had been working on a PA for her [employer]. Pt states if we send a rx for # 14 then pharm will fill this and he can pay out of pocket

## 2014-01-11 NOTE — Telephone Encounter (Signed)
Prior auth approved for [employer].  Auth good until 01/11/16. Pharm notified.

## 2014-01-23 ENCOUNTER — Other Ambulatory Visit: Payer: Self-pay | Admitting: Internal Medicine

## 2014-01-24 NOTE — Telephone Encounter (Signed)
Bonnie Clark, Rx placed in in-box ready for pickup/faxing.  

## 2014-01-30 ENCOUNTER — Ambulatory Visit (INDEPENDENT_AMBULATORY_CARE_PROVIDER_SITE_OTHER): Payer: Medicare Other | Admitting: Sports Medicine

## 2014-01-30 ENCOUNTER — Encounter: Payer: Self-pay | Admitting: Sports Medicine

## 2014-01-30 ENCOUNTER — Telehealth: Payer: Self-pay | Admitting: Cardiology

## 2014-01-30 VITALS — BP 143/82 | HR 75 | Ht 67.0 in

## 2014-01-30 DIAGNOSIS — M1611 Unilateral primary osteoarthritis, right hip: Secondary | ICD-10-CM | POA: Insufficient documentation

## 2014-01-30 DIAGNOSIS — M169 Osteoarthritis of hip, unspecified: Secondary | ICD-10-CM

## 2014-01-30 DIAGNOSIS — M160 Bilateral primary osteoarthritis of hip: Secondary | ICD-10-CM

## 2014-01-30 DIAGNOSIS — M1610 Unilateral primary osteoarthritis, unspecified hip: Secondary | ICD-10-CM | POA: Diagnosis not present

## 2014-01-30 NOTE — Assessment & Plan Note (Signed)
Ultrasound guided injection to the right femoral acetabular joint as well. Return to see me on an as needed basis. Arthritis is quite severe, I have advised that she discuss total hip arthroplasty with her orthopedic surgeon.

## 2014-01-30 NOTE — Progress Notes (Signed)
  Subjective:    CC: Right hip pain  HPI: This is a pleasant 73 year old female, she has severe, end-stage osteoarthritis of the right hip. At this point she is essentially completely confined to a motorized wheelchair. I placed her through physical therapy which improved her symptoms significantly. Unfortunately she continued to have some pain she localizes the right groin, worse with weightbearing and hip flexion. Pain is moderate, persistent.  Past medical history, Surgical history, Family history not pertinant except as noted below, Social history, Allergies, and medications have been entered into the medical record, reviewed, and no changes needed.   Review of Systems: No fevers, chills, night sweats, weight loss, chest pain, or shortness of breath.   Objective:    General: Well Developed, well nourished, and in no acute distress.  Neuro: Alert and oriented x3, extra-ocular muscles intact, sensation grossly intact.  HEENT: Normocephalic, atraumatic, pupils equal round reactive to light, neck supple, no masses, no lymphadenopathy, thyroid nonpalpable.  Skin: Warm and dry, no rashes. Cardiac: Regular rate and rhythm, no murmurs rubs or gallops, no lower extremity edema.  Respiratory: Clear to auscultation bilaterally. Not using accessory muscles, speaking in full sentences.  Procedure: Real-time Ultrasound Guided Injection of right femoral acetabular joint Device: GE Logiq E  Verbal informed consent obtained.  Time-out conducted.  Noted no overlying erythema, induration, or other signs of local infection.  Skin prepped in a sterile fashion.  Local anesthesia: Topical Ethyl chloride.  With sterile technique and under real time ultrasound guidance:  With great difficulty I was able to find the femoral neck, I was unable to get a good view of the femoral head or the acetabulum, this was due to her body habitus. I advanced the needle, and contacted the femoral neck, I was able to easily  inject 2 cc of Kenalog 40, and 4 cc of lidocaine. Completed without difficulty  Pain immediately resolved suggesting accurate placement of the medication.  Advised to call if fevers/chills, erythema, induration, drainage, or persistent bleeding.  Images permanently stored and available for review in the ultrasound unit.  Impression: Technically successful ultrasound guided injection.  Impression and Recommendations:    I spent 40 minutes with this patient, greater than 50% was face-to-face time counseling regarding the above diagnosis.

## 2014-01-30 NOTE — Telephone Encounter (Signed)
Spoke with pt, she has started having new SOB. It occurs when she gets up in the morning and walks into the dinning room and also when she gets into bed at night. She also gets SOB with exertion through the house. She sits and rest and the SOB and heart pounding will resolve. She does not wake SOB or have trouble sleeping. She also reports her heart pounding when the SOB occurs. She denies edema. She feels this may have started with the start of the pradaxa but she has been on that for awhile. She will come to the office tomorrow to see scott weaver pac.

## 2014-01-30 NOTE — Telephone Encounter (Signed)
New Message:  Pt's husband states his wife has been having SOB on and off over the past couple weeks. Pt's husband is requesting a call back.

## 2014-01-31 ENCOUNTER — Ambulatory Visit (INDEPENDENT_AMBULATORY_CARE_PROVIDER_SITE_OTHER): Payer: Medicare Other | Admitting: Physician Assistant

## 2014-01-31 ENCOUNTER — Emergency Department (HOSPITAL_COMMUNITY): Payer: Medicare Other

## 2014-01-31 ENCOUNTER — Emergency Department (HOSPITAL_COMMUNITY)
Admission: EM | Admit: 2014-01-31 | Discharge: 2014-01-31 | Disposition: A | Discharge status: Home / Self Care | Payer: Medicare Other | Attending: Emergency Medicine | Admitting: Emergency Medicine

## 2014-01-31 ENCOUNTER — Other Ambulatory Visit: Payer: Self-pay | Admitting: *Deleted

## 2014-01-31 ENCOUNTER — Telehealth: Payer: Self-pay | Admitting: Physician Assistant

## 2014-01-31 ENCOUNTER — Encounter (HOSPITAL_COMMUNITY): Payer: Self-pay | Admitting: Emergency Medicine

## 2014-01-31 ENCOUNTER — Encounter: Payer: Self-pay | Admitting: Physician Assistant

## 2014-01-31 VITALS — BP 144/82 | HR 111 | Ht 67.0 in | Wt 340.0 lb

## 2014-01-31 DIAGNOSIS — I428 Other cardiomyopathies: Secondary | ICD-10-CM | POA: Diagnosis not present

## 2014-01-31 DIAGNOSIS — I10 Essential (primary) hypertension: Secondary | ICD-10-CM | POA: Insufficient documentation

## 2014-01-31 DIAGNOSIS — IMO0001 Reserved for inherently not codable concepts without codable children: Secondary | ICD-10-CM | POA: Insufficient documentation

## 2014-01-31 DIAGNOSIS — M109 Gout, unspecified: Secondary | ICD-10-CM | POA: Diagnosis not present

## 2014-01-31 DIAGNOSIS — J811 Chronic pulmonary edema: Secondary | ICD-10-CM

## 2014-01-31 DIAGNOSIS — Z96659 Presence of unspecified artificial knee joint: Secondary | ICD-10-CM | POA: Insufficient documentation

## 2014-01-31 DIAGNOSIS — N179 Acute kidney failure, unspecified: Secondary | ICD-10-CM | POA: Diagnosis not present

## 2014-01-31 DIAGNOSIS — R0602 Shortness of breath: Secondary | ICD-10-CM

## 2014-01-31 DIAGNOSIS — R7989 Other specified abnormal findings of blood chemistry: Secondary | ICD-10-CM

## 2014-01-31 DIAGNOSIS — E079 Disorder of thyroid, unspecified: Secondary | ICD-10-CM | POA: Insufficient documentation

## 2014-01-31 DIAGNOSIS — E785 Hyperlipidemia, unspecified: Secondary | ICD-10-CM | POA: Insufficient documentation

## 2014-01-31 DIAGNOSIS — M7989 Other specified soft tissue disorders: Secondary | ICD-10-CM | POA: Diagnosis not present

## 2014-01-31 DIAGNOSIS — N318 Other neuromuscular dysfunction of bladder: Secondary | ICD-10-CM | POA: Diagnosis not present

## 2014-01-31 DIAGNOSIS — F329 Major depressive disorder, single episode, unspecified: Secondary | ICD-10-CM | POA: Diagnosis not present

## 2014-01-31 DIAGNOSIS — Z7902 Long term (current) use of antithrombotics/antiplatelets: Secondary | ICD-10-CM | POA: Diagnosis not present

## 2014-01-31 DIAGNOSIS — I5022 Chronic systolic (congestive) heart failure: Secondary | ICD-10-CM | POA: Diagnosis not present

## 2014-01-31 DIAGNOSIS — E875 Hyperkalemia: Secondary | ICD-10-CM | POA: Diagnosis not present

## 2014-01-31 DIAGNOSIS — E669 Obesity, unspecified: Secondary | ICD-10-CM | POA: Insufficient documentation

## 2014-01-31 DIAGNOSIS — F411 Generalized anxiety disorder: Secondary | ICD-10-CM | POA: Insufficient documentation

## 2014-01-31 DIAGNOSIS — I4891 Unspecified atrial fibrillation: Secondary | ICD-10-CM

## 2014-01-31 DIAGNOSIS — F3289 Other specified depressive episodes: Secondary | ICD-10-CM | POA: Diagnosis not present

## 2014-01-31 DIAGNOSIS — R799 Abnormal finding of blood chemistry, unspecified: Secondary | ICD-10-CM | POA: Diagnosis not present

## 2014-01-31 DIAGNOSIS — Z79899 Other long term (current) drug therapy: Secondary | ICD-10-CM | POA: Insufficient documentation

## 2014-01-31 DIAGNOSIS — E119 Type 2 diabetes mellitus without complications: Secondary | ICD-10-CM | POA: Insufficient documentation

## 2014-01-31 DIAGNOSIS — G4700 Insomnia, unspecified: Secondary | ICD-10-CM | POA: Insufficient documentation

## 2014-01-31 LAB — CBC
HEMATOCRIT: 34.2 % — AB (ref 36.0–46.0)
Hemoglobin: 11.5 g/dL — ABNORMAL LOW (ref 12.0–15.0)
MCH: 34.2 pg — ABNORMAL HIGH (ref 26.0–34.0)
MCHC: 33.6 g/dL (ref 30.0–36.0)
MCV: 101.8 fL — AB (ref 78.0–100.0)
Platelets: 149 10*3/uL — ABNORMAL LOW (ref 150–400)
RBC: 3.36 MIL/uL — ABNORMAL LOW (ref 3.87–5.11)
RDW: 14 % (ref 11.5–15.5)
WBC: 11.2 10*3/uL — AB (ref 4.0–10.5)

## 2014-01-31 LAB — BASIC METABOLIC PANEL
BUN: 36 mg/dL — AB (ref 6–23)
BUN: 38 mg/dL — AB (ref 6–23)
CHLORIDE: 107 meq/L (ref 96–112)
CHLORIDE: 102 meq/L (ref 96–112)
CO2: 23 mEq/L (ref 19–32)
CO2: 21 mEq/L (ref 19–32)
Calcium: 9.4 mg/dL (ref 8.4–10.5)
Calcium: 9.5 mg/dL (ref 8.4–10.5)
Creatinine, Ser: 1.5 mg/dL — ABNORMAL HIGH (ref 0.4–1.2)
Creatinine, Ser: 1.4 mg/dL — ABNORMAL HIGH (ref 0.50–1.10)
GFR calc Af Amer: 43 mL/min — ABNORMAL LOW (ref >90)
GFR calc non Af Amer: 37 mL/min — ABNORMAL LOW (ref >90)
GFR: 35.47 mL/min — AB (ref >60.00)
GLUCOSE: 171 mg/dL — AB (ref 70–99)
Glucose, Bld: 180 mg/dL — ABNORMAL HIGH (ref 70–99)
POTASSIUM: 6.2 meq/L — AB (ref 3.7–5.3)
Potassium: 6.4 mEq/L (ref 3.5–5.1)
Sodium: 139 mEq/L (ref 135–145)
Sodium: 138 mEq/L (ref 137–147)

## 2014-01-31 LAB — D-DIMER, QUANTITATIVE: D-Dimer, Quant: 0.55 ug/mL-FEU — ABNORMAL HIGH (ref 0.00–0.48)

## 2014-01-31 LAB — URINALYSIS, ROUTINE W REFLEX MICROSCOPIC
Bilirubin Urine: NEGATIVE
Glucose, UA: NEGATIVE mg/dL
Ketones, ur: NEGATIVE mg/dL
NITRITE: POSITIVE — AB
PROTEIN: NEGATIVE mg/dL
Specific Gravity, Urine: 1.019 (ref 1.005–1.030)
UROBILINOGEN UA: 0.2 mg/dL (ref 0.0–1.0)
pH: 5 (ref 5.0–8.0)

## 2014-01-31 LAB — I-STAT TROPONIN, ED: Troponin i, poc: 0 ng/mL (ref 0.00–0.08)

## 2014-01-31 LAB — URINE MICROSCOPIC-ADD ON

## 2014-01-31 LAB — CBG MONITORING, ED
Glucose-Capillary: 129 mg/dL — ABNORMAL HIGH (ref 70–99)
Glucose-Capillary: 149 mg/dL — ABNORMAL HIGH (ref 70–99)

## 2014-01-31 LAB — CBC WITH DIFFERENTIAL/PLATELET
Basophils Absolute: 0 10*3/uL (ref 0.0–0.1)
Basophils Relative: 0.1 % (ref 0.0–3.0)
Eosinophils Absolute: 0 10*3/uL (ref 0.0–0.7)
Eosinophils Relative: 0 % (ref 0.0–5.0)
HCT: 35.3 % — ABNORMAL LOW (ref 36.0–46.0)
Hemoglobin: 11.4 g/dL — ABNORMAL LOW (ref 12.0–15.0)
LYMPHS ABS: 0.7 10*3/uL (ref 0.7–4.0)
Lymphocytes Relative: 6.3 % — ABNORMAL LOW (ref 12.0–46.0)
MCHC: 32.2 g/dL (ref 30.0–36.0)
MCV: 104 fl — AB (ref 78.0–100.0)
Monocytes Absolute: 0.6 10*3/uL (ref 0.1–1.0)
Monocytes Relative: 5.2 % (ref 3.0–12.0)
Neutro Abs: 9.6 10*3/uL — ABNORMAL HIGH (ref 1.4–7.7)
Neutrophils Relative %: 88.4 % — ABNORMAL HIGH (ref 43.0–77.0)
PLATELETS: 145 10*3/uL — AB (ref 150.0–400.0)
RBC: 3.39 Mil/uL — ABNORMAL LOW (ref 3.87–5.11)
RDW: 14.8 % — ABNORMAL HIGH (ref 11.5–14.6)
WBC: 10.8 10*3/uL — AB (ref 4.5–10.5)

## 2014-01-31 LAB — TSH: TSH: 1.19 u[IU]/mL (ref 0.35–5.50)

## 2014-01-31 LAB — PROTIME-INR
INR: 1.64 — AB (ref 0.00–1.49)
PROTHROMBIN TIME: 19 s — AB (ref 11.6–15.2)

## 2014-01-31 LAB — PRO B NATRIURETIC PEPTIDE: Pro B Natriuretic peptide (BNP): 3441 pg/mL — ABNORMAL HIGH (ref 0–125)

## 2014-01-31 LAB — BRAIN NATRIURETIC PEPTIDE: Pro B Natriuretic peptide (BNP): 553 pg/mL — ABNORMAL HIGH (ref 0.0–100.0)

## 2014-01-31 MED ORDER — SAXAGLIPTIN HCL 2.5 MG PO TABS: 5.0000 mg | ORAL_TABLET | Freq: Every day | ORAL | Status: DC

## 2014-01-31 MED ORDER — INSULIN ASPART 100 UNIT/ML ~~LOC~~ SOLN
5.0000 [IU] | Freq: Once | SUBCUTANEOUS | Status: AC
  Administered 2014-01-31: 5 [IU] via INTRAVENOUS
  Filled 2014-01-31: qty 1

## 2014-01-31 MED ORDER — SODIUM POLYSTYRENE SULFONATE 15 GM/60ML PO SUSP
30.0000 g | Freq: Once | ORAL | Status: AC
  Administered 2014-01-31: 30 g via ORAL
  Filled 2014-01-31: qty 120

## 2014-01-31 MED ORDER — FUROSEMIDE 20 MG PO TABS: 20.0000 mg | ORAL_TABLET | Freq: Every day | ORAL | Status: DC

## 2014-01-31 MED ORDER — INSULIN ASPART 100 UNIT/ML IV SOLN: 5.0000 [IU] | Freq: Once | INTRAVENOUS | Status: DC

## 2014-01-31 MED ORDER — FUROSEMIDE 10 MG/ML IJ SOLN
40.0000 mg | Freq: Once | INTRAMUSCULAR | Status: AC
  Administered 2014-01-31: 40 mg via INTRAVENOUS
  Filled 2014-01-31: qty 4

## 2014-01-31 MED ORDER — FAMOTIDINE 20 MG PO TABS: 20.0000 mg | ORAL_TABLET | Freq: Two times a day (BID) | ORAL | Status: DC

## 2014-01-31 MED ORDER — DEXTROSE 50 % IV SOLN
1.0000 | Freq: Once | INTRAVENOUS | Status: AC
  Administered 2014-01-31: 50 mL via INTRAVENOUS
  Filled 2014-01-31: qty 50

## 2014-01-31 NOTE — Telephone Encounter (Addendum)
Spoke w/pt earlier for D-dimer results.  Then received critical lab results.  Spoke w/Scott [employer] who reviewed results and advises that pt go to [facility] ER and be evaluated by ER physician.  Notified pt to go to ER to be evaluated.  Notified triage nurse in ER and Trish for FYI.

## 2014-01-31 NOTE — Discharge Instructions (Signed)
Hyperkalemia  Hyperkalemia is when you have too much potassium in your blood. This can be a life-threatening condition. Potassium is normally removed (excreted) from the body by the kidneys.  CAUSES   The potassium level in your body can become too high for the following reasons:   You take in too much potassium. You can do this by:   Using salt substitutes. They contain large amounts of potassium.   Taking potassium supplements from your caregiver. The dose may be too high for you.   Eating foods or taking nutritional products with potassium.   You excrete too little potassium. This can happen if:   Your kidneys are not functioning properly. Kidney (renal) disease is a very common cause of hyperkalemia.   You are taking medicines that lower your excretion of potassium, such as certain diuretic medicines.   You have an adrenal gland disease called Addison's disease.   You have a urinary tract obstruction, such as kidney stones.   You are on treatment to mechanically clean your blood (dialysis) and you skip a treatment.   You release a high amount of potassium from your cells into your blood. You may have a condition that causes potassium to move from your cells to your bloodstream. This can happen with:   Injury to muscles or other tissues. Most potassium is stored in the muscles.   Severe burns or infections.   Acidic blood plasma (acidosis). Acidosis can result from many diseases, such as uncontrolled diabetes.  SYMPTOMS   Usually, there are no symptoms unless the potassium is dangerously high or has risen very quickly. Symptoms may include:   Irregular or very slow heartbeat.   Feeling sick to your stomach (nauseous).   Tiredness (fatigue).   Nerve problems such as tingling of the skin, numbness of the hands or feet, weakness, or paralysis.  DIAGNOSIS   A simple blood test can measure the amount of potassium in your body. An electrocardiogram test of the heart can also help make the diagnosis.  The heart may beat dangerously fast or slow down and stop beating with severe hyperkalemia.   TREATMENT   Treatment depends on how bad the condition is and on the underlying cause.   If the hyperkalemia is an emergency (causing heart problems or paralysis), many different medicines can be used alone or together to lower the potassium level briefly. This may include an insulin injection even if you are not diabetic. Emergency dialysis may be needed to remove potassium from the body.   If the hyperkalemia is less severe or dangerous, the underlying cause is treated. This can include taking medicines if needed. Your prescription medicines may be changed. You may also need to take a medicine to help your body get rid of potassium. You may need to eat a diet low in potassium.  HOME CARE INSTRUCTIONS    Take medicines and supplements as directed by your caregiver.   Do not take any over-the-counter medicines, supplements, natural products, herbs, or vitamins without reviewing them with your caregiver. Certain supplements and natural food products can have high amounts of potassium. Other products (such as ibuprofen) can damage weak kidneys and raise your potassium.   You may be asked to do repeat lab tests. Be sure to follow these directions.   If you have kidney disease, you may need to follow a low potassium diet.  SEEK MEDICAL CARE IF:    You notice an irregular or very slow heartbeat.   You feel lightheaded.     these instructions.  Will watch your condition.  Will get help right away if you are not doing well or get worse. Document Released: 10/31/2002 Document Revised: 02/02/2012 Document Reviewed: 04/17/2011 [facility] Patient Information 2014 [place], [place].  Kidney Failure Kidney failure happens when  the kidneys cannot remove waste and excess fluid that naturally builds up in your blood after your body breaks down food. This leads to a dangerous buildup of waste products and fluid in the blood. HOME CARE  Follow your diet as told by your doctor.  Take all medicines as told by your doctor.  Keep all of your dialysis appointments. Call if you are unable to keep an appointment. GET HELP RIGHT AWAY IF:   You make a lot more or very little pee (urine).  Your face or ankles puff up (swell).  You develop shortness of breath.  You develop weakness, feel tired, or you do not feel hungry (appetite loss).  You feel poorly for no known reason. MAKE SURE YOU:   Understand these instructions.  Will watch your condition.  Will get help right away if you are not doing well or get worse. Document Released: 02/04/2010 Document Revised: 02/02/2012 Document Reviewed: 03/13/2010 [facility] Patient Information 2014 [place], [place].

## 2014-01-31 NOTE — ED Notes (Signed)
Provider at the bedside.  

## 2014-01-31 NOTE — Patient Instructions (Signed)
Your physician has recommended you make the following change in your medication:  1) RESTART Lasix 40mg  daily 2) START Pepcid 20mg  twice daily( To be purchased over the counter)  Lab Today: Bmet, Cbc, Tsh, Bnp, D-dimer  Your physician recommends that you return for lab work in:  1 week repeat bmet  Your physician recommends that you schedule a follow-up appointment in: 1 week with Dr.Crenshaw/ or Jobe Gibbon day that Dr.Crenshaw is in the office

## 2014-01-31 NOTE — Progress Notes (Signed)
[address] [place], [place]  [address] Phone: [phone] Fax:  [phone]  Date:  01/31/2014   ID:  Bonnie Clark, DOB 07/18/1941, MRN [id number]  PCP:  Laren Boom, DO  Cardiologist:  Dr. Olga Millers     History of Present Illness: Bonnie Clark is a 73 y.o. female with a hx of AFib, cardiomyopathy with EF 40-45%, low risk myoview in 02/2012, HTN, HL, DM, CKD, hip DJD with decreased mobility.  Last seen by Dr. Olga Millers in 04/2013.  F/u echo demonstrated stable EF.    Echo (03/2012):  EF 40-45%. Lexiscan Myoview (02/2012):  No ischemia, not gated; Low Risk. Echo (05/2013):  EF 40-45%, mild to mod LAE.    Patient notes increased dyspnea with exertion over the last several weeks. She feels that it started when she held her Pradaxa for a dental procedure. She held Pradaxa for 3 days but had to cancel the procedure. She is now back on Pradaxa. She is limited to a power chair. She does walk with a walker. She notes significant dyspnea with ADLs (walking across the room, transferring to bed). She is NYHA class III. She sleeps on an incline chronically. She denies PND. She does note increased LE edema. She denies chest discomfort or syncope. She denies pleuritic chest pain.  Recent Labs: 02/14/2013: Hemoglobin 14.4; TSH 2.36  08/17/2013: ALT 33; Creatinine 1.1; HDL Cholesterol 43.40; LDL (calc) 60; Potassium 4.9   Wt Readings from Last 3 Encounters:  01/31/14 340 lb (154.223 kg)  10/18/13 330 lb (149.687 kg)  06/21/12 333 lb (151.048 kg)     Past Medical History  Diagnosis Date  . Urine incontinence   . Arthritis   . Diabetes mellitus   . Hyperlipidemia   . Hypertension   . Thyroid disease   . Obesity 09/11/2011  . Chronic insomnia 09/11/2011  . Spinal stenosis 09/11/2011  . Fibromyalgia 09/11/2011  . Anxiety and depression 09/11/2011  . Sciatica of right side 09/11/2011  . Gout 09/11/2011  . Gait disorder 12/21/2011  . Atrial fibrillation   . Peripheral edema  06/21/2012  . OAB (overactive bladder) 06/21/2012    Current Outpatient Prescriptions  Medication Sig Dispense Refill  . allopurinol (ZYLOPRIM) 300 MG tablet TAKE ONE TABLET BY MOUTH EVERY DAY  90 tablet  3  . atorvastatin (LIPITOR) 40 MG tablet TAKE ONE TABLET BY MOUTH EVERY DAY  90 tablet  3  . clonazePAM (KLONOPIN) 1 MG tablet TAKE ONE-HALF TO ONE TABLET BY MOUTH UP TO THREE TIMES DAILY AS NEEDED FOR ANXIETY AND SLEEP.  90 tablet  2  . COLCRYS 0.6 MG tablet TAKE ONE TABLET BY MOUTH EVERY DAY  90 tablet  3  . dabigatran (PRADAXA) 150 MG CAPS TAKE ONE CAPSULE BY MOUTH EVERY 12 HOURS  60 capsule  4  . DULoxetine (CYMBALTA) 60 MG capsule TAKE ONE CAPSULE BY MOUTH EVERY DAY  90 capsule  2  . furosemide (LASIX) 40 MG tablet Take 40 mg by mouth.      [name] Kitchen glipiZIDE (GLUCOTROL XL) 5 MG 24 hr tablet Take 1 tablet (5 mg total) by mouth daily with breakfast.  30 tablet  3  . glucose blood (ONE TOUCH TEST STRIPS) test strip Use as instructed - check sugars 2 x a day  300 each  11  . lisinopril (PRINIVIL,ZESTRIL) 10 MG tablet Take 1 tablet (10 mg total) by mouth daily. Take 1 by mouth daily  90 tablet  0  . metoprolol (  LOPRESSOR) 100 MG tablet Take 1 tablet (100 mg total) by mouth 2 (two) times daily.  180 tablet  3  . Saxagliptin-Metformin (KOMBIGLYZE XR) 2.03-999 MG TB24 TAKE 2 TABLETS BY MOUTH WITH DINNER EVERY NIGHT  14 tablet  0  . solifenacin (VESICARE) 10 MG tablet Take by mouth daily.      . traMADol-acetaminophen (ULTRACET) 37.5-325 MG per tablet Take 1 tablet by mouth every 6 (six) hours as needed.  60 tablet  3  . zolpidem (AMBIEN) 5 MG tablet TAKE ONE TABLET BY MOUTH AT BEDTIME AS NEEDED FOR SLEEP  30 tablet  0   No current facility-administered medications for this visit.    Allergies:   Voltaren   Social History:  The patient  reports that she has never smoked. She has never used smokeless tobacco. She reports that she does not drink alcohol or use illicit drugs.   Family History:  The  patient's family history includes Arthritis in her father and mother; Depression in her mother; Stroke in her sister.   ROS:  Please see the history of present illness.   She has a cough that is nonproductive. She denies fever. She has chronic diarrhea. She does note occasional scant bright red blood per rectum often associated with straining. She denies melena.   All other systems reviewed and negative.   PHYSICAL EXAM: VS:  BP 144/82  Pulse 111  Ht 5\' 7"  (1.702 m)  Wt 340 lb (154.223 kg)  BMI 53.24 kg/m2 Well nourished, well developed, in no acute distress HEENT: normal Neck:  I cannot assess JVDat 90 Cardiac:  normal S1, S2; irregularly irregular rhythm; no murmur Lungs:  Decreased breath sound bilaterally, no wheezing, rhonchi or rales Abd: soft, nontender, no hepatomegaly Ext: 1+ bilateral LE edema Skin: warm and dry Neuro:  CNs 2-12 intact, no focal abnormalities noted  EKG:  Atrial fibrillation, HR 101     ASSESSMENT AND PLAN:  1. Dyspnea: I suspect the patient is volume overloaded. Exam is difficult given body habitus.  Her weight today is unreliable. We did not weigh her in the office. She has noted increased LE edema. She does not take Lasix on a regular basis. She has not taken it in several weeks.  She relates her symptoms to start when she's stopped Pradaxa for 3 days. She is certainly quite sedentary. She has some risks for DVT. I will obtain a basic metabolic panel, CBC, TSH, BNP and d-dimer. I have asked her to resume Lasix 40 mg a day. Repeat basic metabolic panel in one week. If her d-dimer is significantly elevated, I will likely need to proceed with LE venous Dopplers and VQ scan versus chest CTA.  Consider stress testing if symptoms do not improve with diuresis.   2. Atrial Fibrillation:  Heart rate is increased. Continue current therapy. Diurese as noted. 3. Cardiomyopathy:  Continued ACEI and beta blocker. Recent echo with stable EF. 4. Hypertension:  Blood pressure  somewhat elevated today. Diurese as noted. 5. Hyperlipidemia:  Continue statin. 6. Disposition:  Follow up with Dr. Jens Somrenshaw or me in one week  Signed, Tereso NewcomerScott Dorcas Melito, PA-C  01/31/2014 9:24 AM

## 2014-01-31 NOTE — Telephone Encounter (Signed)
Patient is returning your call, Please call back.  °

## 2014-01-31 NOTE — Progress Notes (Signed)
Labs returned markedly abnormal. 01/31/2014: BUN 36*; Creatinine 1.5*; HCT 35.3*; Hemoglobin 11.4*; Platelets 145.0*; Potassium 6.4*; Pro B Natriuretic peptide (BNP) 553.0*; Sodium 139; WBC 10.8*  She has evidence of acute renal failure (creatinine usually better), anemia (new) and hyperkalemia.  WBC is somewhat elevated as well with a L shift. Etiology of symptoms and lab findings not entirely clear. I have some concern for underlying infectious process contributing. We will send the patient to the ED for further evaluation. Signed, Tereso Newcomer, PA-C   01/31/2014 2:05 PM

## 2014-01-31 NOTE — ED Notes (Signed)
CBG 129 

## 2014-01-31 NOTE — ED Notes (Signed)
Per pt sts sent here by her doctor for abnormal labs. sts really not sure which labs. When looking at pt labs K is 6.4. sts she wen to the doctor for SOB more with exertion.

## 2014-01-31 NOTE — ED Notes (Signed)
Resident at the bedside to talk with pt about need for admission to the hospital. Pt adamantly stating that she does not want to stay for admission. In depth discussion of the risks involved with not staying done by the provider. Pt agreed to have medication to correct the potassium imbalance. Husband at the bedside. Pt talking with family over the phone. Will reassess.

## 2014-01-31 NOTE — ED Provider Notes (Signed)
CSN: [id number]     Arrival date & time 01/31/14  1541 History   First MD Initiated Contact with Patient 01/31/14 1649     Chief Complaint  Patient presents with  . hyperkalemia    HPI Comments: 73 yo F hx of HTN, HLD, DMII, atrial fibrillation, presents with CC dyspnea, hyperkalemia.  Pt states she has had increased exertional dyspnea for the last 4-5 weeks, with associated nonproductive cough, bilateral lower extremity edema.  She denies fever, chills, diaphoresis, CP, abdominal pain, nausea, vomiting, diarrhea, myalgias, rash, or any other symptoms.  Pt went to PCP today, and had lab work which demonstrated K 6.4, with elevated BUN/Cr.  Pt was sent here for further eval.  Pt states she has been compliant with all Rx mediciation.  Denies hx of CAD, CHF, COPD, Asthma, or DVT/PE.  Denies recent surgery, prolonged immobilization although does have issue with ambulation, and uses scooter to get around.  Pt denies prior hx of kidney disease.  No decreased urination.   The history is provided by the patient. No language interpreter was used.    Past Medical History  Diagnosis Date  . Urine incontinence   . Arthritis   . Diabetes mellitus   . Hyperlipidemia   . Hypertension   . Thyroid disease   . Obesity 09/11/2011  . Chronic insomnia 09/11/2011  . Spinal stenosis 09/11/2011  . Fibromyalgia 09/11/2011  . Anxiety and depression 09/11/2011  . Sciatica of right side 09/11/2011  . Gout 09/11/2011  . Gait disorder 12/21/2011  . Atrial fibrillation   . Peripheral edema 06/21/2012  . OAB (overactive bladder) 06/21/2012   Past Surgical History  Procedure Laterality Date  . Thyroidectomy, partial      at 73yo  . Shoulder surgery      right - approx 2005  . Replacement total knee bilateral      right approx 1994, left approx 1999   Family History  Problem Relation Age of Onset  . Arthritis Mother   . Depression Mother   . Arthritis Father   . Stroke Sister    History  Substance Use  Topics  . Smoking status: Never Smoker   . Smokeless tobacco: Never Used  . Alcohol Use: No   OB History   Grav Para Term Preterm Abortions TAB SAB Ect Mult Living                 Review of Systems  Constitutional: Negative for fever and chills.  Respiratory: Positive for cough and shortness of breath.   Cardiovascular: Positive for leg swelling. Negative for chest pain and palpitations.  Gastrointestinal: Negative for nausea, vomiting, abdominal pain and diarrhea.  Musculoskeletal: Negative for myalgias.  Skin: Negative for rash.  Neurological: Negative for dizziness, weakness, light-headedness, numbness and headaches.  Hematological: Negative for adenopathy. Does not bruise/bleed easily.  All other systems reviewed and are negative.   Allergies  Voltaren  Home Medications   Current Outpatient Rx  Name  Route  Sig  Dispense  Refill  . allopurinol (ZYLOPRIM) 300 MG tablet      TAKE ONE TABLET BY MOUTH EVERY DAY   90 tablet   3   . atorvastatin (LIPITOR) 40 MG tablet      TAKE ONE TABLET BY MOUTH EVERY DAY   90 tablet   3   . clonazePAM (KLONOPIN) 1 MG tablet      TAKE ONE-HALF TO ONE TABLET BY MOUTH UP TO THREE TIMES DAILY AS NEEDED FOR  ANXIETY AND SLEEP.   90 tablet   2   . COLCRYS 0.6 MG tablet      TAKE ONE TABLET BY MOUTH EVERY DAY   90 tablet   3   . dabigatran (PRADAXA) 150 MG CAPS      TAKE ONE CAPSULE BY MOUTH EVERY 12 HOURS   60 capsule   4   . DULoxetine (CYMBALTA) 60 MG capsule      TAKE ONE CAPSULE BY MOUTH EVERY DAY   90 capsule   2   . famotidine (PEPCID) 20 MG tablet   Oral   Take 1 tablet (20 mg total) by mouth 2 (two) times daily.         . furosemide (LASIX) 40 MG tablet   Oral   Take 40 mg by mouth.         [name] Kitchen. glipiZIDE (GLUCOTROL XL) 5 MG 24 hr tablet   Oral   Take 1 tablet (5 mg total) by mouth daily with breakfast.   30 tablet   3   . glucose blood (ONE TOUCH TEST STRIPS) test strip      Use as instructed -  check sugars 2 x a day   300 each   11   . lisinopril (PRINIVIL,ZESTRIL) 10 MG tablet   Oral   Take 1 tablet (10 mg total) by mouth daily. Take 1 by mouth daily   90 tablet   0   . metoprolol (LOPRESSOR) 100 MG tablet   Oral   Take 1 tablet (100 mg total) by mouth 2 (two) times daily.   180 tablet   3   . Saxagliptin-Metformin (KOMBIGLYZE XR) 2.03-999 MG TB24      TAKE 2 TABLETS BY MOUTH WITH DINNER EVERY NIGHT   14 tablet   0   . solifenacin (VESICARE) 10 MG tablet   Oral   Take by mouth daily.         . traMADol-acetaminophen (ULTRACET) 37.5-325 MG per tablet   Oral   Take 1 tablet by mouth every 6 (six) hours as needed.   60 tablet   3   . zolpidem (AMBIEN) 5 MG tablet      TAKE ONE TABLET BY MOUTH AT BEDTIME AS NEEDED FOR SLEEP   30 tablet   0    BP 147/78  Pulse 89  Temp(Src) 98.3 F (36.8 C)  Resp 18  SpO2 98% Physical Exam  Nursing note and vitals reviewed. Constitutional: She is oriented to person, place, and time. She appears well-developed and well-nourished.  HENT:  Head: Normocephalic and atraumatic.  Right Ear: External ear normal.  Left Ear: External ear normal.  Nose: Nose normal.  Mouth/Throat: Oropharynx is clear and moist.  Eyes: Conjunctivae and EOM are normal. Pupils are equal, round, and reactive to light.  Neck: Normal range of motion. Neck supple.  Cardiovascular: Normal rate, regular rhythm, normal heart sounds and intact distal pulses.   2+ pitting edema bilaterally   Pulmonary/Chest: Effort normal. No respiratory distress. She has wheezes. She has no rales. She exhibits no tenderness.  Mild expiratory wheeze bilaterally.   Abdominal: Soft. Bowel sounds are normal. She exhibits no distension and no mass. There is no tenderness. There is no rebound and no guarding.  Musculoskeletal: Normal range of motion.  Neurological: She is alert and oriented to person, place, and time.  Skin: Skin is warm and dry.    ED Course   Procedures (including critical care time) Labs Review Labs  Reviewed  CBC - Abnormal; Notable for the following:    WBC 11.2 (*)    RBC 3.36 (*)    Hemoglobin 11.5 (*)    HCT 34.2 (*)    MCV 101.8 (*)    MCH 34.2 (*)    Platelets 149 (*)    All other components within normal limits  BASIC METABOLIC PANEL - Abnormal; Notable for the following:    Potassium 6.2 (*)    Glucose, Bld 171 (*)    BUN 38 (*)    Creatinine, Ser 1.40 (*)    GFR calc non Af Amer 37 (*)    GFR calc Af Amer 43 (*)    All other components within normal limits  PRO B NATRIURETIC PEPTIDE - Abnormal; Notable for the following:    Pro B Natriuretic peptide (BNP) 3441.0 (*)    All other components within normal limits  PROTIME-INR - Abnormal; Notable for the following:    Prothrombin Time 19.0 (*)    INR 1.64 (*)    All other components within normal limits  URINALYSIS, ROUTINE W REFLEX MICROSCOPIC - Abnormal; Notable for the following:    APPearance CLOUDY (*)    Hgb urine dipstick TRACE (*)    Nitrite POSITIVE (*)    Leukocytes, UA MODERATE (*)    All other components within normal limits  URINE MICROSCOPIC-ADD ON - Abnormal; Notable for the following:    Squamous Epithelial / LPF FEW (*)    Bacteria, UA MANY (*)    Casts HYALINE CASTS (*)    All other components within normal limits  CBG MONITORING, ED - Abnormal; Notable for the following:    Glucose-Capillary 129 (*)    All other components within normal limits  CBG MONITORING, ED - Abnormal; Notable for the following:    Glucose-Capillary 149 (*)    All other components within normal limits  I-STAT TROPOININ, ED   Imaging Review Dg Chest 2 View  01/31/2014   CLINICAL DATA SHORTNESS OF BREATH, NO CHEST PAIN  EXAM CHEST  2 VIEW  COMPARISON None.  FINDINGS Bilateral diffuse interstitial thickening. Enlargement of the central pulmonary vasculature. No focal consolidation, pleural effusion or pneumothorax. Enlarged heart size. Right shoulder  arthroplasty. There is mild thoracic spine spondylosis.  IMPRESSION Overall findings concerning for pulmonary edema.  SIGNATURE  Electronically Signed   By: Elige Ko   On: 01/31/2014 18:12     EKG Interpretation   Date/Time:  Tuesday January 31 2014 16:24:27 EDT Ventricular Rate:  83 PR Interval:    QRS Duration: 76 QT Interval:  352 QTC Calculation: 413 R Axis:   146 Text Interpretation:  Atrial fibrillation Right axis deviation Pulmonary  disease pattern Septal infarct , age undetermined Abnormal ECG No  significant change since last tracing Confirmed by PICKERING  MD, Harrold Donath  [phone]) on 01/31/2014 5:11:44 PM      MDM   Final diagnoses:  None   73 yo F hx of HTN, HLD, DMII, atrial fibrillation, presents with CC dyspnea, hyperkalemia.  Filed Vitals:   01/31/14 1730  BP: 155/90  Pulse: 79  Temp:   Resp: 18   Physical exam as above.  Pt slightly hypertensive 140's SBP, but otherwise VS WNL.  EKG shows atrial fibrillation, rate controlled, no peaked T waves, no QRS widening. Pt does not appear in any distress.  Pt appears fluid overloaded on exam, bilateral lower extremity edema, and some wheezes on lung exam.  CXR shows pulmonary edema.  Labs  confirm hyperkalemia 6.2, acute renal failure BUN/Cr 38/1.40.  BNP elevated 3441, Troponin 0.00.    Diagnosis of renal failure, fluid overload, hyperkalemia.  Pt emphatically refuses admission.  I have discussed pt's serious medical condition, and possibility of worsening symptoms, and possibility of death if not properly treated.  Pt states she understands this, and is able to repeat back to me my concerns, but still would like to be d/c home.    Nephrology consulted.  Plan for insulin, dextrose, kayexalate, Lasix here.  Will d/c home lisinopril, and metformin.  Plan to send home with Lasix 20 mg daily.  Will require f/u with PCP by Thursday, repeat labs, and further coordination with nephrology (whom I have given two of pt's personal  phone numbers for follow-up).  Pt to be d/c home in fair condition.  Encouraged to continue supportive care.  Rx for Lasix 20 mg daily.  Pt to d/c medications above as described.  F/u with PCP, nephrology by Thursday.  Strict return precautions given.  Pt understands and agrees with plan.  I have discussed pt's care plan with Dr. Rubin Payor.  Jon Gills, MD  Addendum: UA positive for UTI.  Pt called and informed, and Rx for Bactrim, will be called into pt's pharmacy.    Jon Gills, MD 02/01/14 [phone]

## 2014-02-01 ENCOUNTER — Telehealth: Payer: Self-pay | Admitting: *Deleted

## 2014-02-01 NOTE — Telephone Encounter (Signed)
A call was transferred to my line from triage from [facility]. The pharmacist states pt was rx'ed a medication in the ER that needs a prior auth. I called the pharmacist and told them we would hold off on doing a PA and the pt needs to schedule a hospital f/u. The pharm told me that pt has an appt tomorrow and they gave her three onglyza (ER stopped kombiglyze and started her on onglyza) to hold her over until she is seen

## 2014-02-01 NOTE — Telephone Encounter (Signed)
Called pt to see how she was feeling today since her ov w/Scott W.PA 3/10. States she is feeling better and the lasix is working. She has a follow up with PCP tomorrow 3/12. I will fax results and lab order to PCP today. Pt verbalized understanding.

## 2014-02-01 NOTE — ED Provider Notes (Signed)
I saw and evaluated the patient, reviewed the resident's note and I agree with the findings and plan.   EKG Interpretation   Date/Time:  Tuesday January 31 2014 16:24:27 EDT Ventricular Rate:  83 PR Interval:    QRS Duration: 76 QT Interval:  352 QTC Calculation: 413 R Axis:   146 Text Interpretation:  Atrial fibrillation Right axis deviation Pulmonary  disease pattern Septal infarct , age undetermined Abnormal ECG No  significant change since last tracing Confirmed by Rubin Payor  MD, Harrold Donath  [phone]) on 01/31/2014 5:11:44 PM     Patient with worsening renal function and hyperkalemia. Sent in by PCP. Lab work was verified. Patient is unwilling to stay for admission. Discussed with nephrology and patient will followup.  Juliet Rude. Rubin Payor, MD 02/01/14 [phone]

## 2014-02-02 ENCOUNTER — Encounter: Payer: Self-pay | Admitting: Family Medicine

## 2014-02-02 ENCOUNTER — Ambulatory Visit (INDEPENDENT_AMBULATORY_CARE_PROVIDER_SITE_OTHER): Payer: Medicare Other | Admitting: Family Medicine

## 2014-02-02 VITALS — BP 133/82 | HR 83

## 2014-02-02 DIAGNOSIS — E1165 Type 2 diabetes mellitus with hyperglycemia: Principal | ICD-10-CM

## 2014-02-02 DIAGNOSIS — J810 Acute pulmonary edema: Secondary | ICD-10-CM

## 2014-02-02 DIAGNOSIS — E875 Hyperkalemia: Secondary | ICD-10-CM

## 2014-02-02 DIAGNOSIS — IMO0001 Reserved for inherently not codable concepts without codable children: Secondary | ICD-10-CM | POA: Diagnosis not present

## 2014-02-02 DIAGNOSIS — N179 Acute kidney failure, unspecified: Secondary | ICD-10-CM

## 2014-02-02 DIAGNOSIS — D72829 Elevated white blood cell count, unspecified: Secondary | ICD-10-CM | POA: Diagnosis not present

## 2014-02-02 MED ORDER — SAXAGLIPTIN HCL 2.5 MG PO TABS: 5.0000 mg | ORAL_TABLET | Freq: Every day | ORAL | Status: DC

## 2014-02-02 NOTE — Progress Notes (Signed)
CC: Bonnie Clark is a 73 y.o. female is here for hospital f/u   Subjective: HPI:  Emergency room followup for acute renal failure, pulmonary edema, UTI.  UTI: Patient was asymptomatic however urinalysis did reveal nitrite positive therefore she was started on Bactrim. She denies any known side effects. Admits to urinary frequency however this has started ever since she restarted Lasix, there is been no dysuria or urgency.  She was recently seen by her cardiologist team who noticed she was fluid overloaded and short of breath. Routine labs revealed acute renal failure with a potassium of 6.4. At that time her only complaint was shortness of breath, in hindsight she states that other than shortness of breath she was feeling in her record stating health. She was seen at local emergency room given Lasix, dextrose, Kayexalate and insulin encouraged to be admitted however she adamantly refused.  She was noted to have pulmonary edema on a chest x-ray in the emergency room. Since returning home she continues on Lasix 20 mg daily denies irregular heartbeat, chest pain, shortness of breath, nor peripheral edema. She has the shortness of breath and peripheral edema have greatly improved since restarting Lasix.  She has stopped taking lisinopril and metformin containing anti-hyperglycemics. She currently does not have followup with nephrology.  She has no complaints today other than being a bit frustrated that people may have been making too big a deal of her abnormal lab values.  Review Of Systems Outlined In HPI  Past Medical History  Diagnosis Date  . Urine incontinence   . Arthritis   . Diabetes mellitus   . Hyperlipidemia   . Hypertension   . Thyroid disease   . Obesity 09/11/2011  . Chronic insomnia 09/11/2011  . Spinal stenosis 09/11/2011  . Fibromyalgia 09/11/2011  . Anxiety and depression 09/11/2011  . Sciatica of right side 09/11/2011  . Gout 09/11/2011  . Gait disorder 12/21/2011  . Atrial  fibrillation   . Peripheral edema 06/21/2012  . OAB (overactive bladder) 06/21/2012    Past Surgical History  Procedure Laterality Date  . Thyroidectomy, partial      at 73yo  . Shoulder surgery      right - approx 2005  . Replacement total knee bilateral      right approx 1994, left approx 1999   Family History  Problem Relation Age of Onset  . Arthritis Mother   . Depression Mother   . Arthritis Father   . Stroke Sister     History   Social History  . Marital Status: Married    Spouse Name: N/A    Number of Children: 3  . Years of Education: N/A   Occupational History  . Not on file.   Social History Main Topics  . Smoking status: Never Smoker   . Smokeless tobacco: Never Used  . Alcohol Use: No  . Drug Use: No  . Sexual Activity: Not on file   Other Topics Concern  . Not on file   Social History Narrative  . No narrative on file     Objective: BP 133/82  Pulse 83  General: Alert and Oriented, No Acute Distress HEENT: Pupils equal, round, reactive to light. Conjunctivae clear.  Moist mucous membranes pharynx unremarkable Lungs: Clear to auscultation bilaterally, no wheezing/ronchi/rales.  Comfortable work of breathing. Good air movement. Cardiac: Regular rate and rhythm. Normal S1/S2.  No murmurs, rubs, nor gallops.   Abdomen: Obese and soft Extremities: No peripheral edema.  Strong peripheral pulses.  Mental Status: No depression, anxiety, nor agitation. Skin: Warm and dry.  Assessment & Plan: Bonnie Clark was seen today for hospital f/u.  Diagnoses and associated orders for this visit:  Type II or unspecified type diabetes mellitus without mention of complication, uncontrolled - COMPLETE METABOLIC PANEL WITH GFR - CBC w/Diff - saxagliptin HCl (ONGLYZA) 2.5 MG TABS tablet; Take 2 tablets (5 mg total) by mouth at bedtime.  Leukocytosis - CBC w/Diff  Hyperkalemia - COMPLETE METABOLIC PANEL WITH GFR  Acute renal failure  Acute pulmonary  edema    Type 2 diabetes: Given increased risks of acute on chronic renal failure we will stop all metformin containing products, agree with decision emergency room to switch to onglyza, if her predicted GFR is less than 50 we'll drop this dose to 2.5 mg daily Leukocytosis: This was noted in the emergency room likely contributed to by UTI rechecking today Hyperkalemia: Rechecking metabolic panel today along with GFR Acute renal failure: See above checking renal function Acute pulmonary edema: Clinically this has resolved  We will forward results of above to her cardiology team, I appreciate the help with coordinating care earlier this week..   Return in about 4 weeks (around 03/02/2014).

## 2014-02-03 ENCOUNTER — Telehealth: Payer: Self-pay | Admitting: Family Medicine

## 2014-02-03 DIAGNOSIS — E1165 Type 2 diabetes mellitus with hyperglycemia: Secondary | ICD-10-CM

## 2014-02-03 DIAGNOSIS — N189 Chronic kidney disease, unspecified: Secondary | ICD-10-CM

## 2014-02-03 DIAGNOSIS — IMO0001 Reserved for inherently not codable concepts without codable children: Secondary | ICD-10-CM

## 2014-02-03 LAB — CBC WITH DIFFERENTIAL/PLATELET
Basophils Absolute: 0 10*3/uL (ref 0.0–0.1)
Basophils Relative: 0 % (ref 0–1)
EOS ABS: 0.3 10*3/uL (ref 0.0–0.7)
EOS PCT: 3 % (ref 0–5)
HCT: 37.3 % (ref 36.0–46.0)
Hemoglobin: 12.2 g/dL (ref 12.0–15.0)
Lymphocytes Relative: 16 % (ref 12–46)
Lymphs Abs: 1.4 10*3/uL (ref 0.7–4.0)
MCH: 32.6 pg (ref 26.0–34.0)
MCHC: 32.7 g/dL (ref 30.0–36.0)
MCV: 99.7 fL (ref 78.0–100.0)
MONOS PCT: 10 % (ref 3–12)
Monocytes Absolute: 0.9 10*3/uL (ref 0.1–1.0)
Neutro Abs: 6.4 10*3/uL (ref 1.7–7.7)
Neutrophils Relative %: 71 % (ref 43–77)
PLATELETS: 176 10*3/uL (ref 150–400)
RBC: 3.74 MIL/uL — ABNORMAL LOW (ref 3.87–5.11)
RDW: 15.5 % (ref 11.5–15.5)
WBC: 9 10*3/uL (ref 4.0–10.5)

## 2014-02-03 LAB — COMPLETE METABOLIC PANEL WITH GFR
ALT: 36 U/L — AB (ref 0–35)
AST: 51 U/L — ABNORMAL HIGH (ref 0–37)
Albumin: 4.2 g/dL (ref 3.5–5.2)
Alkaline Phosphatase: 73 U/L (ref 39–117)
BUN: 35 mg/dL — ABNORMAL HIGH (ref 6–23)
CALCIUM: 9.4 mg/dL (ref 8.4–10.5)
CHLORIDE: 101 meq/L (ref 96–112)
CO2: 28 mEq/L (ref 19–32)
Creat: 1.5 mg/dL — ABNORMAL HIGH (ref 0.50–1.10)
GFR, Est African American: 40 mL/min — ABNORMAL LOW
GFR, Est Non African American: 35 mL/min — ABNORMAL LOW
Glucose, Bld: 101 mg/dL — ABNORMAL HIGH (ref 70–99)
Potassium: 4.7 mEq/L (ref 3.5–5.3)
SODIUM: 139 meq/L (ref 135–145)
TOTAL PROTEIN: 7.2 g/dL (ref 6.0–8.3)
Total Bilirubin: 0.7 mg/dL (ref 0.2–1.2)

## 2014-02-03 NOTE — Telephone Encounter (Signed)
Sue Lush, Will you please let Bonnie Clark know that her potassium level is now back in the normal range.  Her kidney function still appears impaired and I'd recommend she see nephrology for further workup.  I've placed a referral for this, this referral was also strongly encouraged based on her ED progress note from earlier this week.  The only medication adjustment would be to decrease Onglyza to 2.5mg  daily to adjust for her kidney function.

## 2014-02-03 NOTE — Telephone Encounter (Signed)
Left message on vm

## 2014-02-06 ENCOUNTER — Other Ambulatory Visit (INDEPENDENT_AMBULATORY_CARE_PROVIDER_SITE_OTHER): Payer: Medicare Other

## 2014-02-06 ENCOUNTER — Ambulatory Visit (INDEPENDENT_AMBULATORY_CARE_PROVIDER_SITE_OTHER): Payer: Medicare Other | Admitting: Physician Assistant

## 2014-02-06 ENCOUNTER — Encounter: Payer: Self-pay | Admitting: Physician Assistant

## 2014-02-06 VITALS — BP 130/79 | HR 74 | Ht 67.0 in | Wt 340.0 lb

## 2014-02-06 DIAGNOSIS — I5022 Chronic systolic (congestive) heart failure: Secondary | ICD-10-CM

## 2014-02-06 DIAGNOSIS — I428 Other cardiomyopathies: Secondary | ICD-10-CM

## 2014-02-06 DIAGNOSIS — I4891 Unspecified atrial fibrillation: Secondary | ICD-10-CM

## 2014-02-06 DIAGNOSIS — N189 Chronic kidney disease, unspecified: Secondary | ICD-10-CM

## 2014-02-06 DIAGNOSIS — I10 Essential (primary) hypertension: Secondary | ICD-10-CM

## 2014-02-06 DIAGNOSIS — E785 Hyperlipidemia, unspecified: Secondary | ICD-10-CM

## 2014-02-06 LAB — BASIC METABOLIC PANEL
BUN: 25 mg/dL — ABNORMAL HIGH (ref 6–23)
CALCIUM: 9.8 mg/dL (ref 8.4–10.5)
CO2: 30 meq/L (ref 19–32)
CREATININE: 1.5 mg/dL — AB (ref 0.4–1.2)
Chloride: 97 mEq/L (ref 96–112)
GFR: 35.46 mL/min — ABNORMAL LOW (ref >60.00)
Glucose, Bld: 151 mg/dL — ABNORMAL HIGH (ref 70–99)
Potassium: 4.9 mEq/L (ref 3.5–5.1)
Sodium: 136 mEq/L (ref 135–145)

## 2014-02-06 NOTE — Progress Notes (Signed)
[address] [place], [place]  [address] Phone: [phone] Fax:  [phone]  Date:  02/06/2014   ID:  Bonnie Clark, DOB 1941-03-27, MRN [id number]  PCP:  Laren Boom, DO  Cardiologist:  Dr. Olga Millers     History of Present Illness: Bonnie Clark is a 73 y.o. female with a hx of AFib, cardiomyopathy with EF 40-45%, low risk myoview in 02/2012, HTN, HL, DM, CKD, hip DJD with decreased mobility.    Echo (03/2012):  EF 40-45%. Lexiscan Myoview (02/2012):  No ischemia, not gated; Low Risk. Echo (05/2013):  EF 40-45%, mild to mod LAE.    I saw her 3/10 for evaluation of dyspnea.  She appeared to be volume overloaded.  I had her resume her Lasix.  Routine labs demonstrated hyperkalemia with K+ 6.4, L shift, low Hgb and increased creatinine.  She was sent to the ED. U/A was + for UTI and this was treated.  She received Kayexalate and her K+ came down.  CXR demonstrated pulmonary edema.  She received IV Lasix.  F/u labs by primary care last week demonstrate stable renal fxn, improved K+ and normal WBC.  She feels much better.  Denies dyspnea, orthopnea, PND.  LE edema is resolved.  No chest pain or syncope.    Recent Labs: 08/17/2013: HDL Cholesterol 43.40; LDL (calc) 60  01/31/2014: Pro B Natriuretic peptide (BNP) 3441.0*; TSH 1.19  02/02/2014: ALT 36*; Creatinine 1.50*; Hemoglobin 12.2; Potassium 4.7   CXR (01/31/14): IMPRESSION Overall findings concerning for pulmonary edema.   Wt Readings from Last 3 Encounters:  02/06/14 340 lb (154.223 kg)  01/31/14 340 lb (154.223 kg)  10/18/13 330 lb (149.687 kg)     Past Medical History  Diagnosis Date  . Urine incontinence   . Arthritis   . Diabetes mellitus   . Hyperlipidemia   . Hypertension   . Thyroid disease   . Obesity 09/11/2011  . Chronic insomnia 09/11/2011  . Spinal stenosis 09/11/2011  . Fibromyalgia 09/11/2011  . Anxiety and depression 09/11/2011  . Sciatica of right side 09/11/2011  . Gout 09/11/2011  . Gait  disorder 12/21/2011  . Atrial fibrillation   . Peripheral edema 06/21/2012  . OAB (overactive bladder) 06/21/2012    Current Outpatient Prescriptions  Medication Sig Dispense Refill  . allopurinol (ZYLOPRIM) 300 MG tablet TAKE ONE TABLET BY MOUTH EVERY DAY  90 tablet  3  . atorvastatin (LIPITOR) 40 MG tablet Take 40 mg by mouth at bedtime.      . calcium carbonate (TUMS - DOSED IN MG ELEMENTAL CALCIUM) 500 MG chewable tablet Chew 1 tablet by mouth daily as needed for heartburn.      . clonazePAM (KLONOPIN) 1 MG tablet TAKE ONE-HALF TO ONE TABLET BY MOUTH UP TO THREE TIMES DAILY AS NEEDED FOR ANXIETY AND SLEEP.  90 tablet  2  . dabigatran (PRADAXA) 150 MG CAPS TAKE ONE CAPSULE BY MOUTH EVERY 12 HOURS  60 capsule  4  . DULoxetine (CYMBALTA) 60 MG capsule TAKE ONE CAPSULE BY MOUTH EVERY DAY  90 capsule  2  . furosemide (LASIX) 20 MG tablet Take 1 tablet (20 mg total) by mouth daily.  15 tablet  0  . glucose blood (ONE TOUCH TEST STRIPS) test strip Use as instructed - check sugars 2 x a day  300 each  11  . metoprolol (LOPRESSOR) 100 MG tablet Take 1 tablet (100 mg total) by mouth 2 (two) times daily.  180 tablet  3  .  saxagliptin HCl (ONGLYZA) 2.5 MG TABS tablet Take 1 tablet (2.5 mg total) by mouth at bedtime.  60 tablet  3  . traMADol-acetaminophen (ULTRACET) 37.5-325 MG per tablet Take 1 tablet by mouth every 6 (six) hours as needed for moderate pain.      . [DISCONTINUED] lisinopril (PRINIVIL,ZESTRIL) 10 MG tablet Take 1 tablet (10 mg total) by mouth daily. Take 1 by mouth daily  90 tablet  0  . [DISCONTINUED] Saxagliptin-Metformin (KOMBIGLYZE XR) 2.03-999 MG TB24 TAKE 2 TABLETS BY MOUTH WITH DINNER EVERY NIGHT  14 tablet  0   No current facility-administered medications for this visit.    Allergies:   Voltaren   Social History:  The patient  reports that she has never smoked. She has never used smokeless tobacco. She reports that she does not drink alcohol or use illicit drugs.   Family  History:  The patient's family history includes Arthritis in her father and mother; Depression in her mother; Stroke in her sister.   ROS:  Please see the history of present illness.   All other systems reviewed and negative.   PHYSICAL EXAM: VS:  BP 130/79  Pulse 74  Ht 5\' 7"  (1.702 m)  Wt 340 lb (154.223 kg)  BMI 53.24 kg/m2 Well nourished, well developed, in no acute distress HEENT: normal Neck:  Difficult to assess JVDat 90 Cardiac:  normal S1, S2; irregularly irregular rhythm; no murmur Lungs:  Decreased breath sound bilaterally, no wheezing, rhonchi or rales Abd: soft, nontender, no hepatomegaly Ext: no LE edema Skin: warm and dry Neuro:  CNs 2-12 intact, no focal abnormalities noted  EKG:  Atrial fibrillation, HR 74     ASSESSMENT AND PLAN:  1. Chronic Systolic CHF:  She developed volume overload while off of her Lasix  She is now back on it and feels much better. We discussed the importance of daily weights and limiting her salt intake.  She will remain on Lasix.  F/u BMET was drawn today.  2. Atrial Fibrillation:  Heart rate is controlled. Continue current therapy. Creatinine clearance is 82.  She can remain on Pradaxa 150 mg bid. 3. Cardiomyopathy:  Continued beta blocker. She is off of ACEI in the setting of worsening renal function and hyperkalemia.  Consider adding this back at some point vs placing her on Hydralazine and Nitrates.  4. Chronic Kidney Disease:  Creatinine stable.  Creatinine clearance is 82 and she can remain on Pradaxa 150 bid.  Laren BoomHommel, Sean, DO is sending her to nephrology.  5. Hypertension:  Blood pressure controlled.  6. Hyperlipidemia:  Continue statin. 7. Disposition:  Follow up with Dr. [place] in [place] in 4-6 weeks.   Signed, Tereso NewcomerScott Tudor Chandley, PA-C  02/06/2014 3:31 PM

## 2014-02-06 NOTE — Patient Instructions (Signed)
Your physician recommends that you continue on your current medications as directed. Please refer to the Current Medication list given to you today.   Your physician recommends that you schedule a follow-up appointment in: 4-6 weeks with Dr Jens Som in [place].

## 2014-02-13 ENCOUNTER — Telehealth: Payer: Self-pay | Admitting: Family Medicine

## 2014-02-13 DIAGNOSIS — E1165 Type 2 diabetes mellitus with hyperglycemia: Principal | ICD-10-CM

## 2014-02-13 DIAGNOSIS — IMO0001 Reserved for inherently not codable concepts without codable children: Secondary | ICD-10-CM

## 2014-02-13 MED ORDER — PIOGLITAZONE HCL 15 MG PO TABS: 15.0000 mg | ORAL_TABLET | Freq: Every day | ORAL | Status: DC

## 2014-02-13 NOTE — Telephone Encounter (Signed)
Husband called and cancelled appt at [facility] for 4/22 @9 :30(he brought this info by the office). I spoke with pt and she said she did not know that dr was going to refer her to a Nephrologist.  She wants to talk to dr first.

## 2014-02-13 NOTE — Telephone Encounter (Signed)
Sue Lush, Will you please pass along the following:  The ED physician, Dr. Hyman Hopes, that saw her on the 10th appears to have discussed her case with the nephrologist on call that night and he was under the impression that the nephrology service was going to contact the [facility] family about following up with them as an outpatient.  When I found that there didn't seem to be an appointment request in our EMR I put one in myself in case someone forgot to do so on the night of her ER visit.  I'm not sure why it was scheduled for [facility], I agree that it would be better with someone who has privileges at our hospitals.  I still think the nephrology appt is a good idea, I can ask our front staff to re-direct the referral if the patient would like.  With respect to her blood sugar, Kombiglyze XR needed to be stopped due to declining kidney function, any medication containing metformin should be avoided from now on.  The saxagliptin component of Kombiglyze XR is still safe to take however not greater than 2.5mg  daily based on her kidney function.  To address her climbing sugars I would encourage her to continue on saxagliptin and to add pioglitazone since this diabetic medication is not influenced by kidney function.  Rx sent to [facility].  It may take 1-2 weeks before BS starts to improve.  (Letter from husband will be placed in scan folder).

## 2014-02-13 NOTE — Telephone Encounter (Signed)
Read husband the below instructions and he voiced understanding.

## 2014-02-15 ENCOUNTER — Ambulatory Visit: Payer: Medicare Other | Admitting: Internal Medicine

## 2014-02-20 ENCOUNTER — Other Ambulatory Visit: Payer: Self-pay | Admitting: Cardiology

## 2014-03-10 ENCOUNTER — Telehealth: Payer: Self-pay | Admitting: Cardiology

## 2014-03-10 NOTE — Telephone Encounter (Signed)
New Message  Pt called to discuss if a lab is needed for her appt//Please call back to assist

## 2014-03-10 NOTE — Telephone Encounter (Signed)
Called patient back and advised that if MD requests repeat BMET it can be completed at the visit next week. Will let Dr.Crenshaw know that she called.

## 2014-03-14 ENCOUNTER — Telehealth: Payer: Self-pay | Admitting: Family Medicine

## 2014-03-14 DIAGNOSIS — IMO0001 Reserved for inherently not codable concepts without codable children: Secondary | ICD-10-CM

## 2014-03-14 DIAGNOSIS — E1165 Type 2 diabetes mellitus with hyperglycemia: Principal | ICD-10-CM

## 2014-03-14 MED ORDER — SAXAGLIPTIN HCL 2.5 MG PO TABS: 2.5000 mg | ORAL_TABLET | Freq: Every day | ORAL | Status: DC

## 2014-03-14 NOTE — Telephone Encounter (Signed)
I gave patient's husband 28 days of Onglyza 2.5 mg samples.  I spoke with Selena Batten and she said that what we need to do is go ahead and send over a rx and that is the only way to initiate a prior auth (if it is needed).  So please send script over and if it is denied, they will send [place] the documentation to start the process.  thanks

## 2014-03-14 NOTE — Telephone Encounter (Signed)
RX sent to [facility].  My suspicion is that since an ED physician initially Rxed this medication the prior auth went to an office (or ED) instead of coming to our office.  My desire for her to be on saxagliptin is due to FDA approval for use in her degree of chronic renal failure.

## 2014-03-15 ENCOUNTER — Ambulatory Visit (INDEPENDENT_AMBULATORY_CARE_PROVIDER_SITE_OTHER): Payer: Medicare Other | Admitting: Cardiology

## 2014-03-15 ENCOUNTER — Encounter: Payer: Self-pay | Admitting: Cardiology

## 2014-03-15 VITALS — BP 132/80 | HR 72

## 2014-03-15 DIAGNOSIS — I429 Cardiomyopathy, unspecified: Secondary | ICD-10-CM

## 2014-03-15 DIAGNOSIS — N289 Disorder of kidney and ureter, unspecified: Secondary | ICD-10-CM | POA: Diagnosis not present

## 2014-03-15 DIAGNOSIS — I428 Other cardiomyopathies: Secondary | ICD-10-CM | POA: Diagnosis not present

## 2014-03-15 MED ORDER — FUROSEMIDE 40 MG PO TABS: 40.0000 mg | ORAL_TABLET | Freq: Every day | ORAL | Status: DC

## 2014-03-15 NOTE — Patient Instructions (Signed)
Your physician recommends that you schedule a follow-up appointment in: 4-6 WEEKS WITH DR Jens Som  Your physician has requested that you have an echocardiogram. Echocardiography is a painless test that uses sound waves to create images of your heart. It provides your doctor with information about the size and shape of your heart and how well your heart's chambers and valves are working. This procedure takes approximately one hour. There are no restrictions for this procedure.   INCREASE FUROSEMIDE TO 40 MG ONCE DAILY  Your physician recommends that you return for lab work IN ONE WEEK IN [place]

## 2014-03-15 NOTE — Assessment & Plan Note (Signed)
Repeat echo given worsening edema; continue metoprolol. Resume ACEI later if renal function stabilizes.

## 2014-03-15 NOTE — Progress Notes (Signed)
HPI: FU CHF and atrial fibrillation. Patient had a Myoview on 03/17/2012 for risk stratification. There was no ischemia. However the patient was noted to be in atrial fibrillation which was a new diagnosis. Holter monitor in May of 2013 showed atrial fibrillation with rate controlled rate. Echocardiogram in July 2014 showed an ejection fraction of 40-45%. There was mild to moderate left atrial enlargement. Decision made for rate control and anticoagulation. Chest x-ray in March of 2015 showed pulmonary edema. BUN and creatinine in March of 2015 25 and 1.5. Recently seen by Tereso Newcomer for her dyspnea/volume excess. Lasix resumed. Since she was last seen, she denies dyspnea or chest pain; lower extremity edema worse.    Current Outpatient Prescriptions  Medication Sig Dispense Refill  . allopurinol (ZYLOPRIM) 300 MG tablet TAKE ONE TABLET BY MOUTH EVERY DAY  90 tablet  3  . atorvastatin (LIPITOR) 40 MG tablet Take 40 mg by mouth at bedtime.      . clonazePAM (KLONOPIN) 1 MG tablet TAKE ONE-HALF TO ONE TABLET BY MOUTH UP TO THREE TIMES DAILY AS NEEDED FOR ANXIETY AND SLEEP.  90 tablet  2  . colchicine 0.6 MG tablet Take 0.6 mg by mouth as needed.      . DULoxetine (CYMBALTA) 60 MG capsule TAKE ONE CAPSULE BY MOUTH EVERY DAY  90 capsule  2  . furosemide (LASIX) 40 MG tablet Take 40 mg by mouth.      [name] Kitchen glucose blood (ONE TOUCH TEST STRIPS) test strip Use as instructed - check sugars 2 x a day  300 each  11  . metoprolol (LOPRESSOR) 100 MG tablet Take 1 tablet (100 mg total) by mouth 2 (two) times daily.  180 tablet  3  . pioglitazone (ACTOS) 15 MG tablet Take 1 tablet (15 mg total) by mouth daily.  30 tablet  3  . PRADAXA 150 MG CAPS capsule TAKE ONE CAPSULE BY MOUTH EVERY 12 HOURS  60 capsule  1  . saxagliptin HCl (ONGLYZA) 2.5 MG TABS tablet Take 1 tablet (2.5 mg total) by mouth at bedtime.  30 tablet  3  . traMADol-acetaminophen (ULTRACET) 37.5-325 MG per tablet Take 1 tablet by mouth every  6 (six) hours as needed for moderate pain.      . [DISCONTINUED] lisinopril (PRINIVIL,ZESTRIL) 10 MG tablet Take 1 tablet (10 mg total) by mouth daily. Take 1 by mouth daily  90 tablet  0  . [DISCONTINUED] Saxagliptin-Metformin (KOMBIGLYZE XR) 2.03-999 MG TB24 TAKE 2 TABLETS BY MOUTH WITH DINNER EVERY NIGHT  14 tablet  0   No current facility-administered medications for this visit.     Past Medical History  Diagnosis Date  . Urine incontinence   . Arthritis   . Diabetes mellitus   . Hyperlipidemia   . Hypertension   . Thyroid disease   . Obesity 09/11/2011  . Chronic insomnia 09/11/2011  . Spinal stenosis 09/11/2011  . Fibromyalgia 09/11/2011  . Anxiety and depression 09/11/2011  . Sciatica of right side 09/11/2011  . Gout 09/11/2011  . Gait disorder 12/21/2011  . Atrial fibrillation   . Peripheral edema 06/21/2012  . OAB (overactive bladder) 06/21/2012    Past Surgical History  Procedure Laterality Date  . Thyroidectomy, partial      at 73yo  . Shoulder surgery      right - approx 2005  . Replacement total knee bilateral      right approx 1994, left approx 1999    History  Social History  . Marital Status: Married    Spouse Name: N/A    Number of Children: 3  . Years of Education: N/A   Occupational History  . Not on file.   Social History Main Topics  . Smoking status: Never Smoker   . Smokeless tobacco: Never Used  . Alcohol Use: No  . Drug Use: No  . Sexual Activity: Not on file   Other Topics Concern  . Not on file   Social History Narrative  . No narrative on file    ROS: no fevers or chills, productive cough, hemoptysis, dysphasia, odynophagia, melena, hematochezia, dysuria, hematuria, rash, seizure activity, orthopnea, PND, pedal edema, claudication. Remaining systems are negative.  Physical Exam: Well-developed obese in no acute distress.  Skin is warm and dry.  HEENT is normal.  Neck is supple.  Chest is clear to auscultation with normal  expansion.  Cardiovascular exam is irregular Abdominal exam nontender or distended. No masses palpated. Extremities show 2+ edema. neuro grossly intact

## 2014-03-15 NOTE — Assessment & Plan Note (Signed)
Continue present meds

## 2014-03-15 NOTE — Assessment & Plan Note (Signed)
Continue metoprolol and pradaxa.

## 2014-03-15 NOTE — Assessment & Plan Note (Signed)
Continue statin. 

## 2014-03-15 NOTE — Assessment & Plan Note (Signed)
Edema is worse; possible contribution from actos; would ask primary care for alternative for DM; may need endocrinology eval. Change lasix to 40 mg daily; bmet one week. Repeat echo.

## 2014-03-15 NOTE — Assessment & Plan Note (Signed)
FU BMET one week; may need nephrology to follow.

## 2014-03-16 ENCOUNTER — Encounter: Payer: Self-pay | Admitting: Family Medicine

## 2014-03-24 NOTE — Telephone Encounter (Signed)
error 

## 2014-03-28 ENCOUNTER — Encounter: Payer: Self-pay | Admitting: Cardiology

## 2014-03-28 NOTE — Telephone Encounter (Signed)
New message     Have appt on 5-20 to see Dr Antoine Poche has an echo scheduled on 5-19.  Will he have the results of the echo by 5-20?

## 2014-03-28 NOTE — Telephone Encounter (Signed)
This encounter was created in error - please disregard.

## 2014-03-29 ENCOUNTER — Other Ambulatory Visit (HOSPITAL_COMMUNITY): Payer: Medicare Other

## 2014-03-30 DIAGNOSIS — I428 Other cardiomyopathies: Secondary | ICD-10-CM | POA: Diagnosis not present

## 2014-03-31 LAB — BASIC METABOLIC PANEL WITH GFR
BUN: 19 mg/dL (ref 6–23)
CO2: 30 mEq/L (ref 19–32)
CREATININE: 0.97 mg/dL (ref 0.50–1.10)
Calcium: 9.1 mg/dL (ref 8.4–10.5)
Chloride: 99 mEq/L (ref 96–112)
GFR, Est African American: 67 mL/min
GFR, Est Non African American: 59 mL/min — ABNORMAL LOW
Glucose, Bld: 90 mg/dL (ref 70–99)
Potassium: 4.5 mEq/L (ref 3.5–5.3)
Sodium: 138 mEq/L (ref 135–145)

## 2014-04-03 ENCOUNTER — Encounter: Payer: Self-pay | Admitting: Family Medicine

## 2014-04-03 ENCOUNTER — Ambulatory Visit (INDEPENDENT_AMBULATORY_CARE_PROVIDER_SITE_OTHER): Payer: Medicare Other | Admitting: Family Medicine

## 2014-04-03 VITALS — BP 129/72 | HR 65

## 2014-04-03 DIAGNOSIS — E1165 Type 2 diabetes mellitus with hyperglycemia: Principal | ICD-10-CM

## 2014-04-03 DIAGNOSIS — IMO0001 Reserved for inherently not codable concepts without codable children: Secondary | ICD-10-CM

## 2014-04-03 DIAGNOSIS — N289 Disorder of kidney and ureter, unspecified: Secondary | ICD-10-CM | POA: Diagnosis not present

## 2014-04-03 DIAGNOSIS — I872 Venous insufficiency (chronic) (peripheral): Secondary | ICD-10-CM | POA: Insufficient documentation

## 2014-04-03 DIAGNOSIS — I8310 Varicose veins of unspecified lower extremity with inflammation: Secondary | ICD-10-CM

## 2014-04-03 LAB — POCT GLYCOSYLATED HEMOGLOBIN (HGB A1C): HEMOGLOBIN A1C: 8

## 2014-04-03 MED ORDER — SAXAGLIPTIN HCL 5 MG PO TABS: 5.0000 mg | ORAL_TABLET | Freq: Every day | ORAL | Status: DC

## 2014-04-03 MED ORDER — TRIAMCINOLONE ACETONIDE 0.1 % EX CREA: TOPICAL_CREAM | CUTANEOUS | Status: DC

## 2014-04-03 NOTE — Progress Notes (Signed)
CC: Bonnie Clark is a 73 y.o. female is here for Leg Swelling   Subjective: HPI:  Patient complains of bilateral leg swelling that is described as moderate to severe in severity and symmetric. It is worse at the end of the day slightly improved with elevation. Is slightly improved with Lasix she has been taking 20 mg daily however when taking 40 mg daily she has intolerable urinary frequency. Over the past week she has noticed some mild redness on the shins and also straw-colored discharge from these sites.  She denies shortness of breath, orthopnea, nor abdominal bloating. denies fevers or chills  Followup type 2 diabetes: Fasting blood sugars have been 110-1:30 over the past last week. Random blood sugars throughout the day are consistently below 160. She denies polyphagia polydipsia nor vision loss. Continues on Actos 15 mg daily and onglyza 2.5 mg daily  Followup renal insufficiency. She is no longer taking lisinopril   Review Of Systems Outlined In HPI  Past Medical History  Diagnosis Date  . Urine incontinence   . Arthritis   . Diabetes mellitus   . Hyperlipidemia   . Hypertension   . Thyroid disease   . Obesity 09/11/2011  . Chronic insomnia 09/11/2011  . Spinal stenosis 09/11/2011  . Fibromyalgia 09/11/2011  . Anxiety and depression 09/11/2011  . Sciatica of right side 09/11/2011  . Gout 09/11/2011  . Gait disorder 12/21/2011  . Atrial fibrillation   . Peripheral edema 06/21/2012  . OAB (overactive bladder) 06/21/2012    Past Surgical History  Procedure Laterality Date  . Thyroidectomy, partial      at 73yo  . Shoulder surgery      right - approx 2005  . Replacement total knee bilateral      right approx 1994, left approx 1999   Family History  Problem Relation Age of Onset  . Arthritis Mother   . Depression Mother   . Arthritis Father   . Stroke Sister     History   Social History  . Marital Status: Married    Spouse Name: N/A    Number of Children: 3  .  Years of Education: N/A   Occupational History  . Not on file.   Social History Main Topics  . Smoking status: Never Smoker   . Smokeless tobacco: Never Used  . Alcohol Use: No  . Drug Use: No  . Sexual Activity: Not on file   Other Topics Concern  . Not on file   Social History Narrative  . No narrative on file     Objective: BP 129/72  Pulse 65  General: Alert and Oriented, No Acute Distress HEENT: Pupils equal, round, reactive to light. Conjunctivae clear. Moist mucous membranes pharynx unremarkable Lungs: Clear to auscultation bilaterally, no wheezing/ronchi/rales.  Comfortable work of breathing. Good air movement. Cardiac: Irregularly irregular rhythm. Normal S1/S2.  No murmurs, rubs, nor gallops.   Extremities: 2+ pitting edema bilaterally from the forefoot extending proximally to the proximal shin, on the left shin she has a 1.5 cm diameter patch of erythema slightly raised with serous weeping, similar lesion on the right shin however 0.5 cm diameter without weeping.   Mental Status: No depression, anxiety, nor agitation. Skin: Warm and dry.  Assessment & Plan: Bonnie Clark was seen today for leg swelling.  Diagnoses and associated orders for this visit:  Type II or unspecified type diabetes mellitus without mention of complication, uncontrolled - saxagliptin HCl (ONGLYZA) 5 MG TABS tablet; Take 1 tablet (5 mg  total) by mouth daily. - POCT HgB A1C  Venous stasis dermatitis  Renal insufficiency  Other Orders - triamcinolone cream (KENALOG) 0.1 %; Apply to affected areas twice a day for up to two weeks, avoid face.    Type 2 diabetes: Uncontrolled with A1c 8.0. Stop Actos due to this most likely significantly contributing to lower extremity edema. Increasing omglyza Venous stasis dermatitis: Hopefully this should improve with stopping Actos, continue to take Lasix as tolerated with respect to urinary symptoms. Triamcinolone application twice a day to areas of redness,  overall this should improve with compression, elevation and better control of edema. She was wrapped in Ace bandages today to provide some degree of external compression and may need to be fitted for compression stockings after we determine how much edema persists with Actos stopped Renal insufficiency: Stable and actually improved since stopping lisinopril, this gives us a little more options when it comes to diabetic medications thankfully  40 minutes spent face-to-face during visit today of which at least 50% was counseling or coordinating care regarding: 1. Type II or unspecified type diabetes mellitus without mention of complication, uncontrolled   2. Venous stasis dermatitis   3. Renal insufficiency       Return in about 4 weeks (around 05/01/2014).

## 2014-04-04 ENCOUNTER — Telehealth: Payer: Self-pay | Admitting: *Deleted

## 2014-04-04 ENCOUNTER — Other Ambulatory Visit: Payer: Self-pay | Admitting: Internal Medicine

## 2014-04-04 DIAGNOSIS — E1165 Type 2 diabetes mellitus with hyperglycemia: Principal | ICD-10-CM

## 2014-04-04 DIAGNOSIS — IMO0001 Reserved for inherently not codable concepts without codable children: Secondary | ICD-10-CM

## 2014-04-04 NOTE — Telephone Encounter (Signed)
Pt's spouse called and states the Onglyza was $114.29. He says you wanted to know how much it cost before they picked up rx

## 2014-04-05 MED ORDER — SITAGLIPTIN PHOSPHATE 100 MG PO TABS: 100.0000 mg | ORAL_TABLET | Freq: Every day | ORAL | Status: DC

## 2014-04-05 NOTE — Telephone Encounter (Signed)
Sue Lush, Will you please let patient know that this sounds unreasonable, I believe Januvia is on her formulary and now that her kidney function has improved we can use this equivalently potent but potentially cheaper medication.  Stop onglyza and start Januvia 100mg  daily sent to her pharmacy.

## 2014-04-05 NOTE — Telephone Encounter (Signed)
Spoke with spouse, he already picked up rx.

## 2014-04-11 ENCOUNTER — Ambulatory Visit (HOSPITAL_COMMUNITY): Payer: Medicare Other | Attending: Cardiovascular Disease | Admitting: Cardiology

## 2014-04-11 DIAGNOSIS — I428 Other cardiomyopathies: Secondary | ICD-10-CM | POA: Diagnosis not present

## 2014-04-11 DIAGNOSIS — I4891 Unspecified atrial fibrillation: Secondary | ICD-10-CM | POA: Diagnosis not present

## 2014-04-11 DIAGNOSIS — I429 Cardiomyopathy, unspecified: Secondary | ICD-10-CM

## 2014-04-11 NOTE — Progress Notes (Signed)
Echo performed. 

## 2014-04-12 ENCOUNTER — Encounter: Payer: Self-pay | Admitting: Cardiology

## 2014-04-12 ENCOUNTER — Ambulatory Visit (INDEPENDENT_AMBULATORY_CARE_PROVIDER_SITE_OTHER): Payer: Medicare Other | Admitting: Cardiology

## 2014-04-12 VITALS — BP 118/70 | HR 68 | Ht 67.0 in

## 2014-04-12 DIAGNOSIS — I429 Cardiomyopathy, unspecified: Secondary | ICD-10-CM

## 2014-04-12 DIAGNOSIS — G4730 Sleep apnea, unspecified: Secondary | ICD-10-CM | POA: Diagnosis not present

## 2014-04-12 DIAGNOSIS — I428 Other cardiomyopathies: Secondary | ICD-10-CM | POA: Diagnosis not present

## 2014-04-12 MED ORDER — FUROSEMIDE 20 MG PO TABS: 20.0000 mg | ORAL_TABLET | Freq: Two times a day (BID) | ORAL | Status: DC

## 2014-04-12 NOTE — Patient Instructions (Signed)
Your physician recommends that you schedule a follow-up appointment in: 3 MONTHS WITH DR CRENSHAW  REFERRAL TO PULMONARY FOR SLEEP APNEA  TAKE FUROSEMIDE 20 MG TWO TIMES DAILY  Your physician recommends that you return for lab work in: ONE WEEK

## 2014-04-12 NOTE — Assessment & Plan Note (Signed)
LV function difficult to quantitate due to the poor quality of study but mildly reduced on most recent echo. We will plan to resume ACE inhibitor in the future once renal function stable.

## 2014-04-12 NOTE — Assessment & Plan Note (Signed)
We discussed weight loss. She is at risk for sleep apnea. Last pulmonary to review for sleep study and therapy as needed.

## 2014-04-12 NOTE — Progress Notes (Signed)
HPI: FU CHF and atrial fibrillation. Patient had a Myoview on 03/17/2012 for risk stratification. There was no ischemia. However the patient was noted to be in atrial fibrillation which was a new diagnosis. Holter monitor in May of 2013 showed atrial fibrillation with controlled rate. Echocardiogram in July 2014 showed an ejection fraction of 40-45%. There was mild to moderate left atrial enlargement. Decision made for rate control and anticoagulation. Recently seen for volume excess and Lasix resumed. Actos DCed. Echocardiogram repeated in may of 2015. Study extremely limited. Ejection fraction felt at least mildly decreased. There was mild right ventricular enlargement and mild right atrial enlargement. Since she was last seen, She denies dyspnea or chest pain. Her pedal edema is minimally improved but she has not taken her Lasix routinely.   Current Outpatient Prescriptions  Medication Sig Dispense Refill  . allopurinol (ZYLOPRIM) 300 MG tablet TAKE ONE TABLET BY MOUTH EVERY DAY  90 tablet  3  . atorvastatin (LIPITOR) 40 MG tablet Take 40 mg by mouth at bedtime.      . clonazePAM (KLONOPIN) 1 MG tablet TAKE ONE-HALF TO ONE TABLET BY MOUTH UP TO THREE TIMES DAILY AS NEEDED FOR ANXIETY AND SLEEP.  90 tablet  2  . colchicine 0.6 MG tablet Take 0.6 mg by mouth as needed.      . DULoxetine (CYMBALTA) 60 MG capsule TAKE ONE CAPSULE BY MOUTH ONCE DAILY  90 capsule  0  . furosemide (LASIX) 40 MG tablet Take 1 tablet (40 mg total) by mouth daily.  30 tablet  12  . glucose blood (ONE TOUCH TEST STRIPS) test strip Use as instructed - check sugars 2 x a day  300 each  11  . metoprolol (LOPRESSOR) 100 MG tablet Take 1 tablet (100 mg total) by mouth 2 (two) times daily.  180 tablet  3  . PRADAXA 150 MG CAPS capsule TAKE ONE CAPSULE BY MOUTH EVERY 12 HOURS  60 capsule  1  . sitaGLIPtin (JANUVIA) 100 MG tablet Take 1 tablet (100 mg total) by mouth daily.  30 tablet  5  . traMADol-acetaminophen (ULTRACET)  37.5-325 MG per tablet Take 1 tablet by mouth every 6 (six) hours as needed for moderate pain.      [name] Kitchen triamcinolone cream (KENALOG) 0.1 % Apply to affected areas twice a day for up to two weeks, avoid face.  80 g  0  . [DISCONTINUED] lisinopril (PRINIVIL,ZESTRIL) 10 MG tablet Take 1 tablet (10 mg total) by mouth daily. Take 1 by mouth daily  90 tablet  0  . [DISCONTINUED] Saxagliptin-Metformin (KOMBIGLYZE XR) 2.03-999 MG TB24 TAKE 2 TABLETS BY MOUTH WITH DINNER EVERY NIGHT  14 tablet  0   No current facility-administered medications for this visit.     Past Medical History  Diagnosis Date  . Urine incontinence   . Arthritis   . Diabetes mellitus   . Hyperlipidemia   . Hypertension   . Thyroid disease   . Obesity 09/11/2011  . Chronic insomnia 09/11/2011  . Spinal stenosis 09/11/2011  . Fibromyalgia 09/11/2011  . Anxiety and depression 09/11/2011  . Sciatica of right side 09/11/2011  . Gout 09/11/2011  . Gait disorder 12/21/2011  . Atrial fibrillation   . Peripheral edema 06/21/2012  . OAB (overactive bladder) 06/21/2012    Past Surgical History  Procedure Laterality Date  . Thyroidectomy, partial      at 73yo  . Shoulder surgery      right - approx 2005  .  Replacement total knee bilateral      right approx 1994, left approx 1999    History   Social History  . Marital Status: Married    Spouse Name: N/A    Number of Children: 3  . Years of Education: N/A   Occupational History  . Not on file.   Social History Main Topics  . Smoking status: Never Smoker   . Smokeless tobacco: Never Used  . Alcohol Use: No  . Drug Use: No  . Sexual Activity: Not on file   Other Topics Concern  . Not on file   Social History Narrative  . No narrative on file    ROS: no fevers or chills, productive cough, hemoptysis, dysphasia, odynophagia, melena, hematochezia, dysuria, hematuria, rash, seizure activity, orthopnea, PND, pedal edema, claudication. Remaining systems are  negative.  Physical Exam: Well-developed morbidly obese in no acute distress.  Skin is warm and dry.  HEENT is normal.  Neck is supple.  Chest is clear to auscultation with normal expansion.  Cardiovascular exam is irregular Abdominal exam nontender or distended. No masses palpated. Extremities show 2+ edema With weeping. neuro grossly intact

## 2014-04-12 NOTE — Assessment & Plan Note (Signed)
Continue beta blocker and pradaxa.  

## 2014-04-12 NOTE — Assessment & Plan Note (Signed)
Patient's edema is essentially unchanged. She has not taken her Lasix routinely and only 20 mg a day most of the time if she does take this medication. I've asked her to take 40 mg daily. Check potassium and renal function in one week.

## 2014-04-12 NOTE — Assessment & Plan Note (Signed)
Continue statin. 

## 2014-04-12 NOTE — Assessment & Plan Note (Signed)
Blood pressure controlled. Continue present medications. 

## 2014-04-24 ENCOUNTER — Other Ambulatory Visit: Payer: Self-pay | Admitting: Cardiology

## 2014-05-05 ENCOUNTER — Ambulatory Visit (INDEPENDENT_AMBULATORY_CARE_PROVIDER_SITE_OTHER): Payer: Medicare Other | Admitting: Family Medicine

## 2014-05-05 ENCOUNTER — Encounter: Payer: Self-pay | Admitting: Family Medicine

## 2014-05-05 VITALS — BP 132/77 | HR 107

## 2014-05-05 DIAGNOSIS — I872 Venous insufficiency (chronic) (peripheral): Secondary | ICD-10-CM

## 2014-05-05 DIAGNOSIS — I10 Essential (primary) hypertension: Secondary | ICD-10-CM | POA: Diagnosis not present

## 2014-05-05 DIAGNOSIS — E1165 Type 2 diabetes mellitus with hyperglycemia: Secondary | ICD-10-CM

## 2014-05-05 DIAGNOSIS — I8310 Varicose veins of unspecified lower extremity with inflammation: Secondary | ICD-10-CM

## 2014-05-05 DIAGNOSIS — IMO0001 Reserved for inherently not codable concepts without codable children: Secondary | ICD-10-CM | POA: Diagnosis not present

## 2014-05-05 MED ORDER — AMBULATORY NON FORMULARY MEDICATION: Status: DC

## 2014-05-05 MED ORDER — CLOBETASOL PROPIONATE 0.05 % EX CREA: 1.0000 "application " | TOPICAL_CREAM | Freq: Two times a day (BID) | CUTANEOUS | Status: DC

## 2014-05-05 MED ORDER — SAXAGLIPTIN HCL 5 MG PO TABS: 5.0000 mg | ORAL_TABLET | Freq: Every day | ORAL | Status: DC

## 2014-05-05 NOTE — Addendum Note (Signed)
Addended by: Wyline Beady on: 05/05/2014 04:33 PM   Modules accepted: Orders

## 2014-05-05 NOTE — Progress Notes (Signed)
CC: Bonnie Clark is a 73 y.o. female is here for Hypertension   Subjective: HPI:  Followup venous stasis dermatitis: Patient has been using triamcinolone cream about every other day with no improvement of redness and sores. She continues to have shallow ulcerations with mild straw-colored discharge. Discharge is worse when her feet are below the level of her heart. Sores are painful, itchy. She believes that her swelling has slightly improved since stopping Actos. She has difficulty taking Lasix on a daily basis due to urinary frequency however does notice an improvement of her swelling when she takes the medication. She's not using the Ace bandage wraps that frequently due to them causing her to feel too hot.  He denies fevers, chills, flushing, rapid heartbeat, nor asymmetric swelling of the lower extremity.  Followup hypertension: Continues on metoprolol on a daily basis with no outside blood pressures to report. Blood pressures have been under control during cardiology visits.-Chest pain, shortness of breath, nor limb claudication  Followup type 2 diabetes: For the past month she's been on Januvia as her only diabetic medication. Since starting this her fasting blood sugars have slowly increased from 120-130 to now170-190.  She admits she has a sedentary lifestyle. She snacks on graham crackers throughout the day. She has irregular sleep cycle. She denies polyphagia or polydipsia.   Review Of Systems Outlined In HPI  Past Medical History  Diagnosis Date  . Urine incontinence   . Arthritis   . Diabetes mellitus   . Hyperlipidemia   . Hypertension   . Thyroid disease   . Obesity 09/11/2011  . Chronic insomnia 09/11/2011  . Spinal stenosis 09/11/2011  . Fibromyalgia 09/11/2011  . Anxiety and depression 09/11/2011  . Sciatica of right side 09/11/2011  . Gout 09/11/2011  . Gait disorder 12/21/2011  . Atrial fibrillation   . Peripheral edema 06/21/2012  . OAB (overactive bladder) 06/21/2012     Past Surgical History  Procedure Laterality Date  . Thyroidectomy, partial      at 73yo  . Shoulder surgery      right - approx 2005  . Replacement total knee bilateral      right approx 1994, left approx 1999   Family History  Problem Relation Age of Onset  . Arthritis Mother   . Depression Mother   . Arthritis Father   . Stroke Sister     History   Social History  . Marital Status: Married    Spouse Name: N/A    Number of Children: 3  . Years of Education: N/A   Occupational History  . Not on file.   Social History Main Topics  . Smoking status: Never Smoker   . Smokeless tobacco: Never Used  . Alcohol Use: No  . Drug Use: No  . Sexual Activity: Not on file   Other Topics Concern  . Not on file   Social History Narrative  . No narrative on file     Objective: BP 132/77  Pulse 107  General: Alert and Oriented, No Acute Distress HEENT: Pupils equal, round, reactive to light. Conjunctivae clear. Moist mucous membranes Lungs: Clear and comfortable work of breathing Cardiac: Regular rate and rhythm.  Abdomen: Obese soft nontender Extremities: 2+ pitting edema in the lower extremities from the toes proximally to the upper shin. She has 5-7 shallow ulcerations no greater than 5 mm in diameter with mild surrounding erythema on both shins with straw colored discharge of mild severity.  Strong peripheral pulses.  Mental Status:  No depression, anxiety, nor agitation. Skin: Warm and dry.  Assessment & Plan: Bonnie Clark was seen today for hypertension.  Diagnoses and associated orders for this visit:  Venous stasis dermatitis - AMBULATORY NON FORMULARY MEDICATION; Knee high compression stockings.  Please fit to size.  Pressure of 20-10mmHg.  Dx: Venous Stasis and Venous Stasis Dermatitis - clobetasol cream (TEMOVATE) 0.05 %; Apply 1 application topically 2 (two) times daily. Only apply to sores on legs.  Hypertension  Type II or unspecified type diabetes mellitus  without mention of complication, uncontrolled - saxagliptin HCl (ONGLYZA) 5 MG TABS tablet; Take 1 tablet (5 mg total) by mouth daily.    Venous stasis dermatitis: I've encouraged her to take her Lasix as directed as tolerated, start compression stockings during all waking hours emphasis placed on having these custom fit at the [facility] by company instead of using one size fits all.  Prior to placing compression stockings on cover sores with clobetasol instead of triamcinolone cream.  She and her husband had many well-thought-out questions regarding what is contributing and causing her edema, and time was taken to answer all questions. Hypertension: Controlled continue metoprolol Type 2 diabetes: Uncontrolled chronic condition, it appears that they're in a donut hole and regardless of what medication we prescribed it's going to cost the same therefore stopping Januvia and restart onglyza continue to keep blood sugar diary and follow up in one month, next step would be to start insulin  40 minutes spent face-to-face during visit today of which at least 50% was counseling or coordinating care regarding: 1. Venous stasis dermatitis   2. Hypertension   3. Type II or unspecified type diabetes mellitus without mention of complication, uncontrolled      Return in about 4 weeks (around 06/02/2014) for Diabetes FU.

## 2014-05-18 ENCOUNTER — Institutional Professional Consult (permissible substitution): Payer: Medicare Other | Admitting: Pulmonary Disease

## 2014-05-19 ENCOUNTER — Telehealth: Payer: Self-pay | Admitting: *Deleted

## 2014-05-19 NOTE — Telephone Encounter (Signed)
Pt's sugar levels have been in the 200's for the last several days per spouse; advised the husband to take pt to Urgent Care to be eval. I did let Dr. Ivan Anchors know about this and he did recommend she go to [facility]

## 2014-05-31 ENCOUNTER — Encounter: Payer: Self-pay | Admitting: Family Medicine

## 2014-05-31 ENCOUNTER — Ambulatory Visit (INDEPENDENT_AMBULATORY_CARE_PROVIDER_SITE_OTHER): Payer: Medicare Other | Admitting: Family Medicine

## 2014-05-31 VITALS — BP 152/95 | HR 75

## 2014-05-31 DIAGNOSIS — IMO0001 Reserved for inherently not codable concepts without codable children: Secondary | ICD-10-CM | POA: Diagnosis not present

## 2014-05-31 DIAGNOSIS — L03119 Cellulitis of unspecified part of limb: Secondary | ICD-10-CM

## 2014-05-31 DIAGNOSIS — L02419 Cutaneous abscess of limb, unspecified: Secondary | ICD-10-CM | POA: Diagnosis not present

## 2014-05-31 DIAGNOSIS — I8310 Varicose veins of unspecified lower extremity with inflammation: Secondary | ICD-10-CM | POA: Diagnosis not present

## 2014-05-31 DIAGNOSIS — I10 Essential (primary) hypertension: Secondary | ICD-10-CM

## 2014-05-31 DIAGNOSIS — L659 Nonscarring hair loss, unspecified: Secondary | ICD-10-CM

## 2014-05-31 DIAGNOSIS — R5381 Other malaise: Secondary | ICD-10-CM

## 2014-05-31 DIAGNOSIS — I872 Venous insufficiency (chronic) (peripheral): Secondary | ICD-10-CM

## 2014-05-31 DIAGNOSIS — E1165 Type 2 diabetes mellitus with hyperglycemia: Secondary | ICD-10-CM

## 2014-05-31 DIAGNOSIS — L03115 Cellulitis of right lower limb: Secondary | ICD-10-CM

## 2014-05-31 MED ORDER — DOXYCYCLINE HYCLATE 100 MG PO TABS: ORAL_TABLET | ORAL | Status: AC

## 2014-05-31 MED ORDER — SAXAGLIPTIN-METFORMIN ER 2.5-1000 MG PO TB24: 1.0000 | ORAL_TABLET | Freq: Two times a day (BID) | ORAL | Status: DC

## 2014-05-31 MED ORDER — CLOBETASOL PROPIONATE 0.05 % EX CREA: 1.0000 "application " | TOPICAL_CREAM | Freq: Two times a day (BID) | CUTANEOUS | Status: DC

## 2014-05-31 MED ORDER — AMBULATORY NON FORMULARY MEDICATION: Status: DC

## 2014-05-31 NOTE — Progress Notes (Signed)
CC: Bonnie Clark is a 73 y.o. female is here for Diabetes and Venous Stasis Ulcer   Subjective: HPI:  Followup essential hypertension: Blood pressures at home have not been above 140/90 since I saw her last. She continues on metoprolol on a daily basis and is no longer taking Lasix. Denies chest pain, shortness of breath, orthopnea but does endorse mild to moderate peripheral edema in the legs which is symmetric.  Followup type 2 diabetes: She has been taking saxagliptin 5 mg daily since I saw her last other than increasing this to 10 mg daily over the past week when she decided to experiment if that would provide greater blood sugar control. She states that fasting blood sugars and postprandial blood sugars have been ranging in the low 200s-250 over the past 2 or 3 weeks. She denies polyuria polyphasia polydipsia nor motor or sensory disturbances. She does have a wound on her right shin that has been taking longer than expected to heal.  Followup venous stasis dermatitis: She was having difficulty tolerating the knee-high compression stockings due to pain but she has been using Ace wraps intermittently. This combined with clobetasol has provided her a great improvement with her dermatitis and ulcerations in her mind. She believes that she said a plateau over the past week and the wounds are becoming much more painful. She denies fevers, chills, nausea but there has been worsening pain and redness at the site of the ulcers on her right lateral shin  Patient complains of hair loss is localized only to the head. Has been present for the past month. There been no changes to her medications. She denies hair loss elsewhere or any other skin complaints other than that described above.   Review Of Systems Outlined In HPI  Past Medical History  Diagnosis Date  . Urine incontinence   . Arthritis   . Diabetes mellitus   . Hyperlipidemia   . Hypertension   . Thyroid disease   . Obesity 09/11/2011  .  Chronic insomnia 09/11/2011  . Spinal stenosis 09/11/2011  . Fibromyalgia 09/11/2011  . Anxiety and depression 09/11/2011  . Sciatica of right side 09/11/2011  . Gout 09/11/2011  . Gait disorder 12/21/2011  . Atrial fibrillation   . Peripheral edema 06/21/2012  . OAB (overactive bladder) 06/21/2012    Past Surgical History  Procedure Laterality Date  . Thyroidectomy, partial      at 73yo  . Shoulder surgery      right - approx 2005  . Replacement total knee bilateral      right approx 1994, left approx 1999   Family History  Problem Relation Age of Onset  . Arthritis Mother   . Depression Mother   . Arthritis Father   . Stroke Sister     History   Social History  . Marital Status: Married    Spouse Name: N/A    Number of Children: 3  . Years of Education: N/A   Occupational History  . Not on file.   Social History Main Topics  . Smoking status: Never Smoker   . Smokeless tobacco: Never Used  . Alcohol Use: No  . Drug Use: No  . Sexual Activity: Not on file   Other Topics Concern  . Not on file   Social History Narrative  . No narrative on file     Objective: BP 152/95  Pulse 75  General: Alert and Oriented, No Acute Distress HEENT: Pupils equal, round, reactive to light. Conjunctivae clear.  Moist membranes ears unremarkable Lungs: Clear comfortable work of breathing Cardiac: Regular rate and rhythm.  Abdomen: Obese soft Extremities: 1+ bilateral pitting edema from the forefoot extending upwards to the tibial tuberosity, symmetric.  Strong peripheral pulses.  Mental Status: No depression, anxiety, nor agitation. Skin: Warm and dry. Mild to moderate erythema surrounding to shallow ulcerations on the right lateral shin without any exudates but these are exquisitely tender to the touch  Assessment & Plan: Bonnie Clark was seen today for diabetes and venous stasis ulcer.  Diagnoses and associated orders for this visit:  Essential hypertension  Type II or  unspecified type diabetes mellitus without mention of complication, uncontrolled - Saxagliptin-Metformin 2.03-999 MG TB24; Take 1 tablet by mouth 2 (two) times daily.  Cellulitis of leg, right - doxycycline (VIBRA-TABS) 100 MG tablet; One by mouth twice a day for ten days.  Venous stasis dermatitis of both lower extremities - clobetasol cream (TEMOVATE) 0.05 %; Apply 1 application topically 2 (two) times daily. Only apply to sores on legs.  Hair loss - TSH - T4, free  Physical deconditioning - AMBULATORY NON FORMULARY MEDICATION; Lift Chair By [employer].  Dx: Obesity 278.00, Deconditioning 799.3.  Use as needed for sitting and transfers.    Essential hypertension: Uncontrolled however I believe this has a large part to do with her pain in her right shin, I've encouraged her to decrease sodium intake and to notify me if blood pressure remains above 140/90 despite pain resolution with the plan below Type 2 diabetes: Uncontrolled, nor to restart metformin since she's regained adequate kidney function since stopping lisinopril Cellulitis of the right leg: Start doxycycline Venous stasis dermatitis and ulceration: Encouraged to wear compression stockings on a daily basis during waking hours, clobetasol cream as needed. Lasix as needed to keep edema down. Hair loss: Check TSH and free T4 however she tells me that she's unlikely going to go get this lab done Physical deconditioning: Per the family's request a prescription for a lift chair has been provided given her obesity and deconditioning  40 minutes spent face-to-face during visit today of which at least 50% was counseling or coordinating care regarding: 1. Essential hypertension   2. Type II or unspecified type diabetes mellitus without mention of complication, uncontrolled   3. Cellulitis of leg, right   4. Venous stasis dermatitis of both lower extremities   5. Hair loss   6. Physical deconditioning       Return in  about 3 months (around 08/31/2014).

## 2014-06-02 ENCOUNTER — Ambulatory Visit: Payer: Medicare Other | Admitting: Family Medicine

## 2014-06-12 ENCOUNTER — Other Ambulatory Visit: Payer: Self-pay | Admitting: Family Medicine

## 2014-06-13 ENCOUNTER — Telehealth: Payer: Self-pay | Admitting: *Deleted

## 2014-06-13 MED ORDER — CEPHALEXIN 500 MG PO CAPS: 500.0000 mg | ORAL_CAPSULE | Freq: Four times a day (QID) | ORAL | Status: DC

## 2014-06-13 NOTE — Telephone Encounter (Signed)
Pt's spouse called and states she has completed the abx course. He says her leg looks somewhat better but it is still red and draining.Plesase advise

## 2014-06-13 NOTE — Telephone Encounter (Signed)
We'll try one more course of antibiotics, this time we'll use cephalexin which I've sent to [facility].

## 2014-06-13 NOTE — Telephone Encounter (Signed)
Pt.notified

## 2014-06-30 ENCOUNTER — Other Ambulatory Visit: Payer: Self-pay | Admitting: Internal Medicine

## 2014-07-04 ENCOUNTER — Other Ambulatory Visit: Payer: Self-pay | Admitting: *Deleted

## 2014-07-04 ENCOUNTER — Telehealth: Payer: Self-pay | Admitting: Family Medicine

## 2014-07-04 ENCOUNTER — Other Ambulatory Visit: Payer: Self-pay | Admitting: Internal Medicine

## 2014-07-04 MED ORDER — GLUCOSE BLOOD VI STRP: ORAL_STRIP | Status: DC

## 2014-07-04 MED ORDER — DULOXETINE HCL 60 MG PO CPEP: ORAL_CAPSULE | ORAL | Status: DC

## 2014-07-04 NOTE — Telephone Encounter (Signed)
Refill req 

## 2014-07-05 ENCOUNTER — Encounter: Payer: Medicare Other | Admitting: Cardiology

## 2014-07-05 NOTE — Progress Notes (Signed)
HPI: FU CHF and atrial fibrillation. Patient had a Myoview on 03/17/2012 for risk stratification. There was no ischemia. However the patient was noted to be in atrial fibrillation which was a new diagnosis. Holter monitor in May of 2013 showed atrial fibrillation with controlled rate. Echocardiogram in July 2014 showed an ejection fraction of 40-45%. There was mild to moderate left atrial enlargement. Decision made for rate control and anticoagulation. Recently seen for volume excess and Lasix resumed. Actos DCed. Echocardiogram repeated in May of 2015. Study extremely limited. Ejection fraction felt at least mildly decreased. There was mild right ventricular enlargement and mild right atrial enlargement. Since she was last seen,    Current Outpatient Prescriptions  Medication Sig Dispense Refill  . allopurinol (ZYLOPRIM) 300 MG tablet TAKE ONE TABLET BY MOUTH EVERY DAY  90 tablet  3  . AMBULATORY NON FORMULARY MEDICATION Knee high compression stockings.  Please fit to size.  Pressure of 20-3730mmHg.  Dx: Venous Stasis and Venous Stasis Dermatitis  2 Units  11  . AMBULATORY NON FORMULARY MEDICATION Lift Chair By [employer].  Dx: Obesity 278.00, Deconditioning 799.3.  Use as needed for sitting and transfers.  1 Units  0  . atorvastatin (LIPITOR) 40 MG tablet Take 40 mg by mouth at bedtime.      . cephALEXin (KEFLEX) 500 MG capsule Take 1 capsule (500 mg total) by mouth 4 (four) times daily.  40 capsule  0  . clobetasol cream (TEMOVATE) 0.05 % Apply 1 application topically 2 (two) times daily. Only apply to sores on legs.  45 g  5  . clonazePAM (KLONOPIN) 1 MG tablet TAKE ONE-HALF TO ONE TABLET BY MOUTH UP TO THREE TIMES DAILY AS NEEDED FOR ANXIETY AND SLEEP.  90 tablet  2  . colchicine 0.6 MG tablet Take 0.6 mg by mouth as needed.      . DULoxetine (CYMBALTA) 60 MG capsule TAKE ONE CAPSULE BY MOUTH ONCE DAILY  90 capsule  2  . furosemide (LASIX) 20 MG tablet Take 1 tablet (20 mg total)  by mouth 2 (two) times daily.  60 tablet  12  . glucose blood (ONE TOUCH ULTRA TEST) test strip Use to test blood sugar 2 times daily as instructed. Dx code: 250.02  300 each  3  . metoprolol (LOPRESSOR) 100 MG tablet Take 1 tablet (100 mg total) by mouth 2 (two) times daily.  180 tablet  3  . PRADAXA 150 MG CAPS capsule TAKE ONE CAPSULE BY MOUTH EVERY 12 HOURS  60 capsule  3  . Saxagliptin-Metformin 2.03-999 MG TB24 Take 1 tablet by mouth 2 (two) times daily.  60 tablet  3  . traMADol-acetaminophen (ULTRACET) 37.5-325 MG per tablet Take 1 tablet by mouth every 6 (six) hours as needed for moderate pain.      . [DISCONTINUED] lisinopril (PRINIVIL,ZESTRIL) 10 MG tablet Take 1 tablet (10 mg total) by mouth daily. Take 1 by mouth daily  90 tablet  0   No current facility-administered medications for this visit.     Past Medical History  Diagnosis Date  . Urine incontinence   . Arthritis   . Diabetes mellitus   . Hyperlipidemia   . Hypertension   . Thyroid disease   . Obesity 09/11/2011  . Chronic insomnia 09/11/2011  . Spinal stenosis 09/11/2011  . Fibromyalgia 09/11/2011  . Anxiety and depression 09/11/2011  . Sciatica of right side 09/11/2011  . Gout 09/11/2011  . Gait disorder 12/21/2011  .  Atrial fibrillation   . Peripheral edema 06/21/2012  . OAB (overactive bladder) 06/21/2012    Past Surgical History  Procedure Laterality Date  . Thyroidectomy, partial      at 73yo  . Shoulder surgery      right - approx 2005  . Replacement total knee bilateral      right approx 1994, left approx 1999    History   Social History  . Marital Status: Married    Spouse Name: N/A    Number of Children: 3  . Years of Education: N/A   Occupational History  . Not on file.   Social History Main Topics  . Smoking status: Never Smoker   . Smokeless tobacco: Never Used  . Alcohol Use: No  . Drug Use: No  . Sexual Activity: Not on file   Other Topics Concern  . Not on file   Social  History Narrative  . No narrative on file    ROS: no fevers or chills, productive cough, hemoptysis, dysphasia, odynophagia, melena, hematochezia, dysuria, hematuria, rash, seizure activity, orthopnea, PND, pedal edema, claudication. Remaining systems are negative.  Physical Exam: Well-developed well-nourished in no acute distress.  Skin is warm and dry.  HEENT is normal.  Neck is supple.  Chest is clear to auscultation with normal expansion.  Cardiovascular exam is regular rate and rhythm.  Abdominal exam nontender or distended. No masses palpated. Extremities show no edema. neuro grossly intact  ECG     This encounter was created in error - please disregard.

## 2014-07-06 ENCOUNTER — Other Ambulatory Visit: Payer: Self-pay | Admitting: Sports Medicine

## 2014-07-07 ENCOUNTER — Telehealth: Payer: Self-pay | Admitting: Cardiology

## 2014-07-07 ENCOUNTER — Encounter: Payer: Self-pay | Admitting: Cardiology

## 2014-07-07 ENCOUNTER — Ambulatory Visit (INDEPENDENT_AMBULATORY_CARE_PROVIDER_SITE_OTHER): Payer: Medicare Other | Admitting: Cardiology

## 2014-07-07 VITALS — BP 124/76 | HR 99 | Ht 67.0 in

## 2014-07-07 DIAGNOSIS — E669 Obesity, unspecified: Secondary | ICD-10-CM | POA: Diagnosis not present

## 2014-07-07 DIAGNOSIS — E785 Hyperlipidemia, unspecified: Secondary | ICD-10-CM | POA: Diagnosis not present

## 2014-07-07 DIAGNOSIS — I4891 Unspecified atrial fibrillation: Secondary | ICD-10-CM

## 2014-07-07 NOTE — Telephone Encounter (Signed)
Spoke with patient and gave her the appointment for [facility] for 07/21/14 @ 9:15 am.  Patient states that that is not a good day or time --gave her the phone number so she can call and reschedule.

## 2014-07-07 NOTE — Assessment & Plan Note (Addendum)
Continue statin. 

## 2014-07-07 NOTE — Telephone Encounter (Signed)
Faxed referral to wound center.

## 2014-07-07 NOTE — Assessment & Plan Note (Signed)
Continue present medications for rate control. Check 24-hour Holter monitor. Continue pradaxa. Check hemoglobin and renal function.

## 2014-07-07 NOTE — Assessment & Plan Note (Signed)
Continue beta blocker. Add ACE inhibitor later if renal function stable on diuretics.

## 2014-07-07 NOTE — Progress Notes (Signed)
HPI: FU CHF and atrial fibrillation. Patient had a Myoview on 03/17/2012 for risk stratification. There was no ischemia. However the patient was noted to be in atrial fibrillation which was a new diagnosis. Holter monitor in May of 2013 showed atrial fibrillation with controlled rate. Echocardiogram in July 2014 showed an ejection fraction of 40-45%. There was mild to moderate left atrial enlargement. Decision made for rate control and anticoagulation. Recently seen for volume excess and Lasix resumed. Actos DCed. Echocardiogram repeated in May of 2015. Study extremely limited. Ejection fraction felt at least mildly decreased. There was mild right ventricular enlargement and mild right atrial enlargement. At last office visit she was not routinely taking Lasix and we increased her to be compliant. Since she was last seen, she denies dyspnea or chest pain. Patient states her edema has improved but she has not taken her Lasix. She was given a course of Keflex for probable lower extremity cellulitis. She has erythema in her right lower extremity which is improving.   Current Outpatient Prescriptions  Medication Sig Dispense Refill  . allopurinol (ZYLOPRIM) 300 MG tablet TAKE ONE TABLET BY MOUTH EVERY DAY  90 tablet  3  . AMBULATORY NON FORMULARY MEDICATION Knee high compression stockings.  Please fit to size.  Pressure of 20-6930mmHg.  Dx: Venous Stasis and Venous Stasis Dermatitis  2 Units  11  . AMBULATORY NON FORMULARY MEDICATION Lift Chair By [employer].  Dx: Obesity 278.00, Deconditioning 799.3.  Use as needed for sitting and transfers.  1 Units  0  . atorvastatin (LIPITOR) 40 MG tablet Take 40 mg by mouth at bedtime.      . clobetasol cream (TEMOVATE) 0.05 % Apply 1 application topically 2 (two) times daily. Only apply to sores on legs.  45 g  5  . DULoxetine (CYMBALTA) 60 MG capsule TAKE ONE CAPSULE BY MOUTH ONCE DAILY  90 capsule  2  . furosemide (LASIX) 20 MG tablet Take 1 tablet  (20 mg total) by mouth 2 (two) times daily.  60 tablet  12  . glucose blood (ONE TOUCH ULTRA TEST) test strip Use to test blood sugar 2 times daily as instructed. Dx code: 250.02  300 each  3  . meloxicam (MOBIC) 15 MG tablet TAKE ONE TABLET BY MOUTH IN THE MORNING WITH BREAKFAST FOR 14 DAYS, AND THEN ONE ONCE DAILY ONLY AS NEEDED FOR PAIN  30 tablet  0  . metoprolol (LOPRESSOR) 100 MG tablet Take 1 tablet (100 mg total) by mouth 2 (two) times daily.  180 tablet  3  . PRADAXA 150 MG CAPS capsule TAKE ONE CAPSULE BY MOUTH EVERY 12 HOURS  60 capsule  3  . Saxagliptin-Metformin (KOMBIGLYZE XR) 2.03-999 MG TB24 Take 1 tablet by mouth 2 (two) times daily.      . traMADol-acetaminophen (ULTRACET) 37.5-325 MG per tablet Take 1 tablet by mouth every 6 (six) hours as needed for moderate pain.      . [DISCONTINUED] lisinopril (PRINIVIL,ZESTRIL) 10 MG tablet Take 1 tablet (10 mg total) by mouth daily. Take 1 by mouth daily  90 tablet  0   No current facility-administered medications for this visit.     Past Medical History  Diagnosis Date  . Urine incontinence   . Arthritis   . Diabetes mellitus   . Hyperlipidemia   . Hypertension   . Thyroid disease   . Obesity 09/11/2011  . Chronic insomnia 09/11/2011  . Spinal stenosis 09/11/2011  . Fibromyalgia 09/11/2011  .  Anxiety and depression 09/11/2011  . Sciatica of right side 09/11/2011  . Gout 09/11/2011  . Gait disorder 12/21/2011  . Atrial fibrillation   . Peripheral edema 06/21/2012  . OAB (overactive bladder) 06/21/2012    Past Surgical History  Procedure Laterality Date  . Thyroidectomy, partial      at 73yo  . Shoulder surgery      right - approx 2005  . Replacement total knee bilateral      right approx 1994, left approx 1999    History   Social History  . Marital Status: Married    Spouse Name: N/A    Number of Children: 3  . Years of Education: N/A   Occupational History  . Not on file.   Social History Main Topics  .  Smoking status: Never Smoker   . Smokeless tobacco: Never Used  . Alcohol Use: No  . Drug Use: No  . Sexual Activity: Not on file   Other Topics Concern  . Not on file   Social History Narrative  . No narrative on file    ROS: no fevers or chills, productive cough, hemoptysis, dysphasia, odynophagia, melena, hematochezia, dysuria, hematuria, rash, seizure activity, orthopnea, PND, claudication. Remaining systems are negative.  Physical Exam: Well-developed morbidly obese in no acute distress.  Skin is warm and dry.  HEENT is normal.  Neck is supple.  Chest is clear to auscultation with normal expansion.  Cardiovascular exam is irregular Abdominal exam nontender or distended. No masses palpated. Extremities show 2+ edema, erythema right lower extremity was mild drainage. neuro grossly intact  ECG Atrial fibrillation at a rate of 99. Low-voltage.Nonspecific ST changes.

## 2014-07-07 NOTE — Assessment & Plan Note (Signed)
Counseled on weight loss.Patient probably has sleep apnea. We scheduled pulmonary evaluation previously but she canceled. She does not want to reschedule.

## 2014-07-07 NOTE — Assessment & Plan Note (Signed)
Patient continues to be noncompliant with directions. She has edema in her lower extremities but actually improved. Mild erythema right lower extremity that also improved following course of Keflex. I have asked her to take Lasix 40 mg daily. Check potassium and renal function in one week. Keep feet elevated. Referred to wound clinic. I will see back in 4-6 weeks. She will most likely need ABIs in the future as her edema improves.

## 2014-07-07 NOTE — Patient Instructions (Signed)
Your physician recommends that you schedule a follow-up appointment in: 4-6 WEEKS WITH DR Jens Som IN [place]  TAKE FUROSEMIDE 40 MG ONCE DAILY  Your physician recommends that you return for lab work in: ONE WEEK IN [place]  REFERRAL TO WOUND CLINIC FOR LEGS  Your physician has recommended that you wear a 24 HOUR holter monitor. Holter monitors are medical devices that record the heart's electrical activity. Doctors most often use these monitors to diagnose arrhythmias. Arrhythmias are problems with the speed or rhythm of the heartbeat. The monitor is a small, portable device. You can wear one while you do your normal daily activities. This is usually used to diagnose what is causing palpitations/syncope (passing out).

## 2014-07-07 NOTE — Assessment & Plan Note (Signed)
Continue present blood pressure medications. 

## 2014-07-18 ENCOUNTER — Encounter: Payer: Self-pay | Admitting: *Deleted

## 2014-07-18 ENCOUNTER — Encounter (INDEPENDENT_AMBULATORY_CARE_PROVIDER_SITE_OTHER): Payer: Medicare Other

## 2014-07-18 DIAGNOSIS — I4891 Unspecified atrial fibrillation: Secondary | ICD-10-CM | POA: Diagnosis not present

## 2014-07-18 NOTE — Progress Notes (Signed)
Patient ID: Bonnie Clark, female   DOB: [birth year], 73 y.o.   MRN: [id number] [facility] 24 hour holter monitor applied to patient.

## 2014-07-21 ENCOUNTER — Encounter (HOSPITAL_BASED_OUTPATIENT_CLINIC_OR_DEPARTMENT_OTHER): Payer: Medicare Other | Attending: General Surgery

## 2014-07-21 ENCOUNTER — Telehealth: Payer: Self-pay

## 2014-07-21 DIAGNOSIS — L97809 Non-pressure chronic ulcer of other part of unspecified lower leg with unspecified severity: Secondary | ICD-10-CM | POA: Diagnosis not present

## 2014-07-21 DIAGNOSIS — L97909 Non-pressure chronic ulcer of unspecified part of unspecified lower leg with unspecified severity: Principal | ICD-10-CM

## 2014-07-21 DIAGNOSIS — I87339 Chronic venous hypertension (idiopathic) with ulcer and inflammation of unspecified lower extremity: Secondary | ICD-10-CM | POA: Diagnosis not present

## 2014-07-21 NOTE — Telephone Encounter (Signed)
Diabetic Bundle. Pt no longer seeing Dr. Elvera Lennox.

## 2014-07-23 NOTE — H&P (Signed)
NAME:  Bonnie Clark, NEILD                        ACCOUNT NO.:  MEDICAL RECORD NO.:  [id number]  LOCATION:                                 FACILITY:  PHYSICIAN:  Joanne Gavel, M.D.        DATE OF BIRTH:  December 12, 1940  DATE OF ADMISSION: DATE OF DISCHARGE:                             HISTORY & PHYSICAL   CHIEF COMPLAINT:  Wounds, both legs.  HISTORY OF PRESENT ILLNESS:  This is a 73 year old diabetic female with a 66-month history of wounds and drainage from both legs.  She was treated with antibiotics, which improved the redness, but did not do anything for the wounds.  PAST MEDICAL HISTORY:  Significant for arthritis, type 2 diabetes, hyperlipidemia, hypertension, thyroid disease, spinal stenosis, fibromyalgia, sciatica, gout, and atrial fibrillation.  PAST SURGICAL HISTORY:  Knee replacements right and left, shoulder replacement, thyroidectomy, and bilateral cataracts.  CIGARETTES:  None.  ALCOHOL:  None.  MEDICATIONS:  Allopurinol, Lipitor, Cymbalta, Lasix, Lopressor, Pradaxa, Kombiglyze, and tramadol.  ALLERGIES:  None.  REVIEW OF SYSTEMS:  As above.  PHYSICAL EXAMINATION:  VITAL SIGNS:  Temperature 98.2, pulse 60, respirations 18, blood pressure 132/66.  Glucose is 160. GENERAL APPEARANCE:  Well developed, obese, in no distress. CHEST:  Clear. HEART:  Regular rhythm. ABDOMEN:  Not examined. EXTREMITIES:  The lower extremities are both red and swollen.  The feet are swollen and peripheral pulses are not palpable.  ABIs are measured at right 1.43, left 1.2.  On the right leg, there are multiple wounds, cluster 9.0 x 0.8 and a 2.0 x 1.9.  On the left leg, there is a cluster of 5.7 x 6.5, and a small wound 0.3 x 0.2 x 0.2.  IMPRESSION:  Chronic venous hypertension with multiple ulcerations bilaterally, diabetes mellitus type 2.  PLAN OF TREATMENT:  We have cultured one of the wounds.  We will start with Profore Lite.  We have also ordered arterial and venous studies. We will  see her in 7 days.     Joanne Gavel, M.D.     RA/MEDQ  D:  07/21/2014  T:  07/22/2014  Job:  [id number]

## 2014-07-25 ENCOUNTER — Telehealth: Payer: Self-pay | Admitting: *Deleted

## 2014-07-25 NOTE — Telephone Encounter (Signed)
Monitor reviewed by dr Jens Som shows atrial fib with controlled rate. No changes in medicine at this time

## 2014-07-26 ENCOUNTER — Encounter (HOSPITAL_BASED_OUTPATIENT_CLINIC_OR_DEPARTMENT_OTHER): Payer: Medicare Other | Attending: General Surgery

## 2014-07-26 DIAGNOSIS — I872 Venous insufficiency (chronic) (peripheral): Secondary | ICD-10-CM | POA: Insufficient documentation

## 2014-07-26 DIAGNOSIS — L97909 Non-pressure chronic ulcer of unspecified part of unspecified lower leg with unspecified severity: Secondary | ICD-10-CM | POA: Diagnosis not present

## 2014-08-03 ENCOUNTER — Ambulatory Visit (HOSPITAL_COMMUNITY)
Admission: RE | Admit: 2014-08-03 | Discharge: 2014-08-03 | Disposition: A | Discharge status: Home / Self Care | Payer: Medicare Other | Source: Ambulatory Visit | Attending: Vascular Surgery | Admitting: Vascular Surgery

## 2014-08-03 ENCOUNTER — Other Ambulatory Visit (HOSPITAL_BASED_OUTPATIENT_CLINIC_OR_DEPARTMENT_OTHER): Payer: Self-pay | Admitting: General Surgery

## 2014-08-03 ENCOUNTER — Ambulatory Visit (INDEPENDENT_AMBULATORY_CARE_PROVIDER_SITE_OTHER)
Admission: RE | Admit: 2014-08-03 | Discharge: 2014-08-03 | Disposition: A | Discharge status: Home / Self Care | Payer: Medicare Other | Source: Ambulatory Visit | Attending: Vascular Surgery | Admitting: Vascular Surgery

## 2014-08-03 DIAGNOSIS — I878 Other specified disorders of veins: Secondary | ICD-10-CM

## 2014-08-03 DIAGNOSIS — I872 Venous insufficiency (chronic) (peripheral): Secondary | ICD-10-CM

## 2014-08-03 DIAGNOSIS — I739 Peripheral vascular disease, unspecified: Secondary | ICD-10-CM

## 2014-08-03 DIAGNOSIS — L97909 Non-pressure chronic ulcer of unspecified part of unspecified lower leg with unspecified severity: Secondary | ICD-10-CM | POA: Diagnosis present

## 2014-08-04 DIAGNOSIS — L97909 Non-pressure chronic ulcer of unspecified part of unspecified lower leg with unspecified severity: Secondary | ICD-10-CM | POA: Diagnosis not present

## 2014-08-04 DIAGNOSIS — I872 Venous insufficiency (chronic) (peripheral): Secondary | ICD-10-CM | POA: Diagnosis not present

## 2014-08-06 ENCOUNTER — Other Ambulatory Visit: Payer: Self-pay | Admitting: Internal Medicine

## 2014-08-07 ENCOUNTER — Other Ambulatory Visit: Payer: Self-pay | Admitting: Family Medicine

## 2014-08-07 ENCOUNTER — Other Ambulatory Visit: Payer: Self-pay | Admitting: *Deleted

## 2014-08-07 ENCOUNTER — Other Ambulatory Visit: Payer: Self-pay | Admitting: Internal Medicine

## 2014-08-07 DIAGNOSIS — I4891 Unspecified atrial fibrillation: Secondary | ICD-10-CM | POA: Diagnosis not present

## 2014-08-07 MED ORDER — METOPROLOL TARTRATE 100 MG PO TABS: 100.0000 mg | ORAL_TABLET | Freq: Two times a day (BID) | ORAL | Status: DC

## 2014-08-08 ENCOUNTER — Telehealth: Payer: Self-pay | Admitting: Family Medicine

## 2014-08-08 DIAGNOSIS — E785 Hyperlipidemia, unspecified: Secondary | ICD-10-CM

## 2014-08-08 DIAGNOSIS — I10 Essential (primary) hypertension: Secondary | ICD-10-CM

## 2014-08-08 LAB — BASIC METABOLIC PANEL WITH GFR
BUN: 17 mg/dL (ref 6–23)
CO2: 24 meq/L (ref 19–32)
Calcium: 8.4 mg/dL (ref 8.4–10.5)
Chloride: 98 mEq/L (ref 96–112)
Creat: 1.07 mg/dL (ref 0.50–1.10)
GFR, Est African American: 60 mL/min
GFR, Est Non African American: 52 mL/min — ABNORMAL LOW
GLUCOSE: 219 mg/dL — AB (ref 70–99)
POTASSIUM: 4 meq/L (ref 3.5–5.3)
SODIUM: 137 meq/L (ref 135–145)

## 2014-08-08 LAB — CBC
HCT: 38.4 % (ref 36.0–46.0)
HEMOGLOBIN: 12.5 g/dL (ref 12.0–15.0)
MCH: 31.6 pg (ref 26.0–34.0)
MCHC: 32.6 g/dL (ref 30.0–36.0)
MCV: 97 fL (ref 78.0–100.0)
PLATELETS: 177 10*3/uL (ref 150–400)
RBC: 3.96 MIL/uL (ref 3.87–5.11)
RDW: 16 % — ABNORMAL HIGH (ref 11.5–15.5)
WBC: 8.5 10*3/uL (ref 4.0–10.5)

## 2014-08-08 NOTE — Telephone Encounter (Signed)
Patient's husband walked-in advised that wifes prevs doctor prescribed Atorvastatin 40mg  Qty 90 and Metoprolol Tart 100 mg Qty 180 and they wont refill it because he has not seen her in a while. Patient's husband stated that he request to know if Dr. Ivan Anchors can prescribe these two meds for her since he is her primary care doctor called into [facility] [address] in [place].

## 2014-08-08 NOTE — Telephone Encounter (Signed)
Metoprolol has been sent. Routing to provider since we haven't filled atorvastatin before

## 2014-08-09 ENCOUNTER — Encounter: Payer: Self-pay | Admitting: Cardiology

## 2014-08-09 ENCOUNTER — Ambulatory Visit (INDEPENDENT_AMBULATORY_CARE_PROVIDER_SITE_OTHER): Payer: Medicare Other | Admitting: Cardiology

## 2014-08-09 VITALS — BP 120/78 | HR 88 | Ht 67.0 in

## 2014-08-09 DIAGNOSIS — I428 Other cardiomyopathies: Secondary | ICD-10-CM | POA: Diagnosis not present

## 2014-08-09 DIAGNOSIS — R609 Edema, unspecified: Secondary | ICD-10-CM | POA: Diagnosis not present

## 2014-08-09 DIAGNOSIS — L97909 Non-pressure chronic ulcer of unspecified part of unspecified lower leg with unspecified severity: Secondary | ICD-10-CM | POA: Diagnosis not present

## 2014-08-09 DIAGNOSIS — I4891 Unspecified atrial fibrillation: Secondary | ICD-10-CM

## 2014-08-09 DIAGNOSIS — E669 Obesity, unspecified: Secondary | ICD-10-CM

## 2014-08-09 DIAGNOSIS — I482 Chronic atrial fibrillation, unspecified: Secondary | ICD-10-CM

## 2014-08-09 DIAGNOSIS — I872 Venous insufficiency (chronic) (peripheral): Secondary | ICD-10-CM | POA: Diagnosis not present

## 2014-08-09 DIAGNOSIS — R600 Localized edema: Secondary | ICD-10-CM

## 2014-08-09 DIAGNOSIS — I429 Cardiomyopathy, unspecified: Secondary | ICD-10-CM

## 2014-08-09 MED ORDER — METOPROLOL TARTRATE 100 MG PO TABS: 100.0000 mg | ORAL_TABLET | Freq: Two times a day (BID) | ORAL | Status: DC

## 2014-08-09 MED ORDER — ATORVASTATIN CALCIUM 40 MG PO TABS: 40.0000 mg | ORAL_TABLET | Freq: Every day | ORAL | Status: DC

## 2014-08-09 MED ORDER — LISINOPRIL 2.5 MG PO TABS
2.5000 mg | ORAL_TABLET | Freq: Every day | ORAL | Status: DC
Start: 2014-08-09 — End: 2015-08-12

## 2014-08-09 NOTE — Assessment & Plan Note (Signed)
Continue beta blocker. Heart rate is controlled. Continue pradaxa.

## 2014-08-09 NOTE — Assessment & Plan Note (Signed)
Continue beta blocker.Add lisinopril 2.5 mg daily. Check potassium and renal function in one week.

## 2014-08-09 NOTE — Telephone Encounter (Signed)
Pt's spouse notified.lab slip up front

## 2014-08-09 NOTE — Assessment & Plan Note (Signed)
Continue present dose of Lasix. 

## 2014-08-09 NOTE — Assessment & Plan Note (Signed)
Continue present blood pressure medications. 

## 2014-08-09 NOTE — Assessment & Plan Note (Signed)
Continue statin. 

## 2014-08-09 NOTE — Progress Notes (Signed)
HPI: FU CHF and atrial fibrillation. Patient had a Myoview on 03/17/2012 for risk stratification. There was no ischemia. However the patient was noted to be in atrial fibrillation which was a new diagnosis. Holter monitor in May of 2013 showed atrial fibrillation with controlled rate. Echocardiogram in July 2014 showed an ejection fraction of 40-45%. There was mild to moderate left atrial enlargement. Decision made for rate control and anticoagulation. Echocardiogram repeated in May of 2015. Study extremely limited. Ejection fraction felt at least mildly decreased. There was mild right ventricular enlargement and mild right atrial enlargement. Recent problems with compliance with lasix and volume overload. Holter 8/15 showed a fib rate controlled. ABIs 9/15 normal; venous dopplers negative. Since last seen, She denies dyspnea or chest pain. Pedal edema is slowly improving.   Current Outpatient Prescriptions  Medication Sig Dispense Refill  . allopurinol (ZYLOPRIM) 300 MG tablet TAKE ONE TABLET BY MOUTH EVERY DAY  90 tablet  3  . AMBULATORY NON FORMULARY MEDICATION Knee high compression stockings.  Please fit to size.  Pressure of 20-88mmHg.  Dx: Venous Stasis and Venous Stasis Dermatitis  2 Units  11  . AMBULATORY NON FORMULARY MEDICATION Lift Chair By [employer].  Dx: Obesity 278.00, Deconditioning 799.3.  Use as needed for sitting and transfers.  1 Units  0  . atorvastatin (LIPITOR) 40 MG tablet Take 1 tablet (40 mg total) by mouth at bedtime.  90 tablet  2  . ciprofloxacin (CIPRO) 500 MG tablet as directed.      . DULoxetine (CYMBALTA) 60 MG capsule TAKE ONE CAPSULE BY MOUTH ONCE DAILY  90 capsule  2  . furosemide (LASIX) 40 MG tablet Take 40 mg by mouth.      [name] Kitchen glucose blood (ONE TOUCH ULTRA TEST) test strip Use to test blood sugar 2 times daily as instructed. Dx code: 250.02  300 each  3  . metoprolol (LOPRESSOR) 100 MG tablet Take 1 tablet (100 mg total) by mouth 2 (two) times  daily.  180 tablet  0  . PRADAXA 150 MG CAPS capsule TAKE ONE CAPSULE BY MOUTH EVERY 12 HOURS  60 capsule  3  . Saxagliptin-Metformin (KOMBIGLYZE XR) 2.03-999 MG TB24 Take 1 tablet by mouth 2 (two) times daily.      . [DISCONTINUED] lisinopril (PRINIVIL,ZESTRIL) 10 MG tablet Take 1 tablet (10 mg total) by mouth daily. Take 1 by mouth daily  90 tablet  0   No current facility-administered medications for this visit.     Past Medical History  Diagnosis Date  . Urine incontinence   . Arthritis   . Diabetes mellitus   . Hyperlipidemia   . Hypertension   . Thyroid disease   . Obesity 09/11/2011  . Chronic insomnia 09/11/2011  . Spinal stenosis 09/11/2011  . Fibromyalgia 09/11/2011  . Anxiety and depression 09/11/2011  . Sciatica of right side 09/11/2011  . Gout 09/11/2011  . Gait disorder 12/21/2011  . Atrial fibrillation   . Peripheral edema 06/21/2012  . OAB (overactive bladder) 06/21/2012    Past Surgical History  Procedure Laterality Date  . Thyroidectomy, partial      at 73yo  . Shoulder surgery      right - approx 2005  . Replacement total knee bilateral      right approx 1994, left approx 1999    History   Social History  . Marital Status: Married    Spouse Name: N/A    Number of Children: 3  .  Years of Education: N/A   Occupational History  . Not on file.   Social History Main Topics  . Smoking status: Never Smoker   . Smokeless tobacco: Never Used  . Alcohol Use: No  . Drug Use: No  . Sexual Activity: Not on file   Other Topics Concern  . Not on file   Social History Narrative  . No narrative on file    ROS: no fevers or chills, productive cough, hemoptysis, dysphasia, odynophagia, melena, hematochezia, dysuria, hematuria, rash, seizure activity, orthopnea, PND, pedal edema, claudication. Remaining systems are negative.  Physical Exam: Well-developed morbidly obese in no acute distress.  Skin is warm and dry.  HEENT is normal.  Neck is supple.    Chest is clear to auscultation with normal expansion.  Cardiovascular exam is irregular Abdominal exam nontender or distended. No masses palpated. Extremities dressed neuro grossly intact

## 2014-08-09 NOTE — Assessment & Plan Note (Signed)
Discussed weight loss. We again discussed sleep study. She states she will agree to this in the future after her edema improves.

## 2014-08-09 NOTE — Telephone Encounter (Signed)
Requested Rxs have been sent to [facility], I would recommend Mariade return for a lab only visit in the near future to check cholesterol and liver function to ensure the medication is still effective and not causing silent side effects. (labs in Andrea's inbox)

## 2014-08-09 NOTE — Patient Instructions (Signed)
Your physician recommends that you schedule a follow-up appointment in: 3 MONTHS WITH DR CRENSHAW  START LISINOPRIL 2.5 MG ONCE DAILY  Your physician recommends that you return for lab work in: ONE WEEK

## 2014-08-15 ENCOUNTER — Encounter: Payer: Self-pay | Admitting: Vascular Surgery

## 2014-08-16 ENCOUNTER — Ambulatory Visit (INDEPENDENT_AMBULATORY_CARE_PROVIDER_SITE_OTHER): Payer: Medicare Other | Admitting: Vascular Surgery

## 2014-08-16 ENCOUNTER — Encounter: Payer: Self-pay | Admitting: Vascular Surgery

## 2014-08-16 VITALS — BP 110/71 | HR 76 | Temp 97.6°F | Resp 20 | Ht 67.0 in | Wt 334.0 lb

## 2014-08-16 DIAGNOSIS — I872 Venous insufficiency (chronic) (peripheral): Secondary | ICD-10-CM | POA: Diagnosis not present

## 2014-08-16 DIAGNOSIS — L97909 Non-pressure chronic ulcer of unspecified part of unspecified lower leg with unspecified severity: Secondary | ICD-10-CM | POA: Diagnosis not present

## 2014-08-16 NOTE — Progress Notes (Signed)
[facility] [place] HISTORY AND PHYSICAL   History of Present Illness:  Patient is a 73 y.o. year old female who presents for evaluation of chronic leg ulcers.  The patient developed worsening leg swelling approximately 5 months ago. She then developed ulcers primarily in the right leg about 4-6 weeks ago. She is currently being treated at the wound center with compression dressings. She states the leg swelling has improved significantly. She states the ulcers are painful but have improved. She is minimally ambulatory secondary to multiple back problems primarily spinal stenosis. She is able to get around the house with a walker. She uses a wheelchair when she is out the house.  Other medical problems include urinary incontinence, arthritis, diabetes, hyperlipidemia, hypertension, obesity, anxiety depression, atrial fibrillation off which are currently stable.  Past Medical History  Diagnosis Date  . Urine incontinence   . Arthritis   . Diabetes mellitus   . Hyperlipidemia   . Hypertension   . Thyroid disease   . Obesity 09/11/2011  . Chronic insomnia 09/11/2011  . Spinal stenosis 09/11/2011  . Fibromyalgia 09/11/2011  . Anxiety and depression 09/11/2011  . Sciatica of right side 09/11/2011  . Gout 09/11/2011  . Gait disorder 12/21/2011  . Atrial fibrillation   . Peripheral edema 06/21/2012  . OAB (overactive bladder) 06/21/2012    Past Surgical History  Procedure Laterality Date  . Thyroidectomy, partial      at 73yo  . Shoulder surgery      right - approx 2005  . Replacement total knee bilateral      right approx 1994, left approx 1999    Social History History  Substance Use Topics  . Smoking status: Never Smoker   . Smokeless tobacco: Never Used  . Alcohol Use: No    Family History Family History  Problem Relation Age of Onset  . Arthritis Mother   . Depression Mother   . Arthritis Father   . Stroke Sister     Allergies  Allergies   Allergen Reactions  . Glipizide     diarrhea  . Voltaren [Diclofenac Sodium]     Rash     Current Outpatient Prescriptions  Medication Sig Dispense Refill  . allopurinol (ZYLOPRIM) 300 MG tablet TAKE ONE TABLET BY MOUTH EVERY DAY  90 tablet  3  . AMBULATORY NON FORMULARY MEDICATION Knee high compression stockings.  Please fit to size.  Pressure of 20-14mmHg.  Dx: Venous Stasis and Venous Stasis Dermatitis  2 Units  11  . AMBULATORY NON FORMULARY MEDICATION Lift Chair By [employer].  Dx: Obesity 278.00, Deconditioning 799.3.  Use as needed for sitting and transfers.  1 Units  0  . atorvastatin (LIPITOR) 40 MG tablet Take 1 tablet (40 mg total) by mouth at bedtime.  90 tablet  2  . DULoxetine (CYMBALTA) 60 MG capsule TAKE ONE CAPSULE BY MOUTH ONCE DAILY  90 capsule  2  . furosemide (LASIX) 40 MG tablet Take 40 mg by mouth.      [name] Kitchen glucose blood (ONE TOUCH ULTRA TEST) test strip Use to test blood sugar 2 times daily as instructed. Dx code: 250.02  300 each  3  . lisinopril (PRINIVIL,ZESTRIL) 2.5 MG tablet Take 1 tablet (2.5 mg total) by mouth daily.  90 tablet  3  . metoprolol (LOPRESSOR) 100 MG tablet Take 1 tablet (100 mg total) by mouth 2 (two) times daily.  180 tablet  0  . PRADAXA 150 MG CAPS capsule TAKE  ONE CAPSULE BY MOUTH EVERY 12 HOURS  60 capsule  3  . Saxagliptin-Metformin (KOMBIGLYZE XR) 2.03-999 MG TB24 Take 1 tablet by mouth 2 (two) times daily.      . ciprofloxacin (CIPRO) 500 MG tablet as directed.       No current facility-administered medications for this visit.    ROS:   General:  No weight loss, Fever, chills  HEENT: No recent headaches, no nasal bleeding, no visual changes, no sore throat  Neurologic: No dizziness, blackouts, seizures. No recent symptoms of stroke or mini- stroke. No recent episodes of slurred speech, or temporary blindness.  Cardiac: No recent episodes of chest pain/pressure, no shortness of breath at rest.  No shortness of breath with  exertion.  Denies history of atrial fibrillation or irregular heartbeat  Vascular: No history of rest pain in feet.  No history of claudication.  + history of non-healing ulcer, No history of DVT   Pulmonary: No home oxygen, no productive cough, no hemoptysis,  No asthma or wheezing  Musculoskeletal:  [ x] Arthritis, [ x] Low back pain,  [x ] Joint pain  Hematologic:No history of hypercoagulable state.  No history of easy bleeding.  No history of anemia  Gastrointestinal: No hematochezia or melena,  No gastroesophageal reflux, no trouble swallowing  Urinary:  chronic Kidney disease,  on HD -  MWF or  TTHS,  Burning with urination, [x ] Frequent urination,  Difficulty urinating;   Skin: No rashes  Psychological: + history of anxiety,  + history of depression   Physical Examination  Filed Vitals:   08/16/14 1439  BP: 110/71  Pulse: 76  Temp: 97.6 F (36.4 C)  TempSrc: Oral  Resp: 20  Height:  (1.702 m)  Weight: 334 lb (151.501 kg)  SpO2: 98%    Body mass index is 52.3 kg/(m^2).  General:  Alert and oriented, no acute distress HEENT: Normal Neck: No bruit or JVD Pulmonary: Clear to auscultation bilaterally Cardiac: Regular Rate and Rhythm without murmur Abdomen: Soft, non-tender, non-distended, no mass, obese Skin: No rash, skin erythema pretibial and medial calf area bilaterally, 3 punched out 1 cm ulcerations right pretibial region 2-3 mm in depth no open broken skin areas on the left side Extremity Pulses:  2+ radial, brachial, absent femoral, absent dorsalis pedis, posterior tibial pulses bilaterally, feet are pink and warm bilaterally pulse exam is difficult due to patient's severe obesity Musculoskeletal: No deformity 2+ edema bilaterally Neurologic: Upper and lower extremity motor 5/5 and symmetric  DATA:  Patient had a venous duplex exam in bilateral ABIs performed on 08/03/2014. A venous duplex showed deep venous reflux primarily in the  popliteal vein bilaterally. There is no superficial venous reflux. She had normal ABIs bilaterally and was triphasic.   ASSESSMENT:  Bilateral lower extremity edema with some evidence of deep vein reflux. Most likely this is primarily secondary to her obesity. No indication for vascular surgical intervention as her superficial venous system is competent. She has otherwise normal arterial and venous circulation.   PLAN:  The patient will continue to followup with the wound center for treatment of her leg ulcers. She would benefit significantly from weight loss to improve all of her symptoms. However with her overall debility from her spine disease this may not be practical. She definitely needs to be placed in the long-term lower extremity compression stockings after her ulcers are healed. The patient will followup with [place] on as-needed basis.  Leonette Most  Neylan Koroma, MD [facility] [place] Office: [phone] Pager: [phone]

## 2014-08-23 DIAGNOSIS — I872 Venous insufficiency (chronic) (peripheral): Secondary | ICD-10-CM | POA: Diagnosis not present

## 2014-08-23 DIAGNOSIS — L97909 Non-pressure chronic ulcer of unspecified part of unspecified lower leg with unspecified severity: Secondary | ICD-10-CM | POA: Diagnosis not present

## 2014-08-24 ENCOUNTER — Other Ambulatory Visit: Payer: Self-pay | Admitting: *Deleted

## 2014-08-24 MED ORDER — DABIGATRAN ETEXILATE MESYLATE 150 MG PO CAPS: ORAL_CAPSULE | ORAL | Status: DC

## 2014-08-25 DIAGNOSIS — L659 Nonscarring hair loss, unspecified: Secondary | ICD-10-CM | POA: Diagnosis not present

## 2014-08-25 DIAGNOSIS — I4891 Unspecified atrial fibrillation: Secondary | ICD-10-CM | POA: Diagnosis not present

## 2014-08-25 DIAGNOSIS — E785 Hyperlipidemia, unspecified: Secondary | ICD-10-CM | POA: Diagnosis not present

## 2014-08-26 LAB — HEPATIC FUNCTION PANEL
ALBUMIN: 3.8 g/dL (ref 3.5–5.2)
ALT: 17 U/L (ref 0–35)
AST: 19 U/L (ref 0–37)
Alkaline Phosphatase: 70 U/L (ref 39–117)
BILIRUBIN DIRECT: 0.2 mg/dL (ref 0.0–0.3)
Indirect Bilirubin: 0.5 mg/dL (ref 0.2–1.2)
Total Bilirubin: 0.7 mg/dL (ref 0.2–1.2)
Total Protein: 6.8 g/dL (ref 6.0–8.3)

## 2014-08-26 LAB — BASIC METABOLIC PANEL WITH GFR
BUN: 18 mg/dL (ref 6–23)
CALCIUM: 8.8 mg/dL (ref 8.4–10.5)
CO2: 26 meq/L (ref 19–32)
Chloride: 99 mEq/L (ref 96–112)
Creat: 0.93 mg/dL (ref 0.50–1.10)
GFR, EST AFRICAN AMERICAN: 71 mL/min
GFR, Est Non African American: 62 mL/min
GLUCOSE: 177 mg/dL — AB (ref 70–99)
Potassium: 4.4 mEq/L (ref 3.5–5.3)
SODIUM: 136 meq/L (ref 135–145)

## 2014-08-26 LAB — LIPID PANEL
CHOL/HDL RATIO: 2.4 ratio
CHOLESTEROL: 123 mg/dL (ref 0–200)
HDL: 51 mg/dL (ref >39)
LDL Cholesterol: 52 mg/dL (ref 0–99)
Triglycerides: 102 mg/dL (ref <150)
VLDL: 20 mg/dL (ref 0–40)

## 2014-08-26 LAB — T4, FREE: Free T4: 1.03 ng/dL (ref 0.80–1.80)

## 2014-08-26 LAB — TSH: TSH: 2.539 u[IU]/mL (ref 0.350–4.500)

## 2014-08-29 ENCOUNTER — Encounter (HOSPITAL_BASED_OUTPATIENT_CLINIC_OR_DEPARTMENT_OTHER): Payer: Medicare Other | Attending: General Surgery

## 2014-08-29 DIAGNOSIS — I87323 Chronic venous hypertension (idiopathic) with inflammation of bilateral lower extremity: Secondary | ICD-10-CM | POA: Insufficient documentation

## 2014-08-29 DIAGNOSIS — L97929 Non-pressure chronic ulcer of unspecified part of left lower leg with unspecified severity: Secondary | ICD-10-CM | POA: Diagnosis not present

## 2014-08-29 DIAGNOSIS — L97919 Non-pressure chronic ulcer of unspecified part of right lower leg with unspecified severity: Secondary | ICD-10-CM | POA: Insufficient documentation

## 2014-08-30 ENCOUNTER — Encounter (HOSPITAL_BASED_OUTPATIENT_CLINIC_OR_DEPARTMENT_OTHER): Payer: Medicare Other

## 2014-09-01 ENCOUNTER — Encounter: Payer: Self-pay | Admitting: Family Medicine

## 2014-09-01 ENCOUNTER — Ambulatory Visit (INDEPENDENT_AMBULATORY_CARE_PROVIDER_SITE_OTHER): Payer: Medicare Other | Admitting: Family Medicine

## 2014-09-01 VITALS — BP 138/67 | HR 67

## 2014-09-01 DIAGNOSIS — N189 Chronic kidney disease, unspecified: Secondary | ICD-10-CM

## 2014-09-01 DIAGNOSIS — Z23 Encounter for immunization: Secondary | ICD-10-CM

## 2014-09-01 DIAGNOSIS — M109 Gout, unspecified: Secondary | ICD-10-CM

## 2014-09-01 DIAGNOSIS — E1122 Type 2 diabetes mellitus with diabetic chronic kidney disease: Secondary | ICD-10-CM | POA: Diagnosis not present

## 2014-09-01 LAB — POCT GLYCOSYLATED HEMOGLOBIN (HGB A1C): Hemoglobin A1C: 8.9

## 2014-09-01 MED ORDER — GLIMEPIRIDE 4 MG PO TABS: 4.0000 mg | ORAL_TABLET | Freq: Every day | ORAL | Status: DC

## 2014-09-01 MED ORDER — ALLOPURINOL 300 MG PO TABS: ORAL_TABLET | ORAL | Status: DC

## 2014-09-01 NOTE — Progress Notes (Addendum)
CC: Bonnie Clark is a 73 y.o. female is here for Diabetes   Subjective: HPI:  Followup type 2 diabetes: Continues to take kombiglyze xr on a daily basis without known intolerance or side effects. No polyuria polyphasia or polydipsia. She's had some poorly healing wounds on the shins however with compressive stockings and wraps these are improving. She denies any new wounds. Fasting blood sugars are ranging between 144-180s. No postprandials to report.  Followup gout: Continues to take allopurinol 300 mg on a daily basis, she cannot recall when her last gout flare was. She denies any new focal joint pain. Denies any new joint swelling redness warmth.   Review Of Systems Outlined In HPI  Past Medical History  Diagnosis Date  . Urine incontinence   . Arthritis   . Diabetes mellitus   . Hyperlipidemia   . Hypertension   . Thyroid disease   . Obesity 09/11/2011  . Chronic insomnia 09/11/2011  . Spinal stenosis 09/11/2011  . Fibromyalgia 09/11/2011  . Anxiety and depression 09/11/2011  . Sciatica of right side 09/11/2011  . Gout 09/11/2011  . Gait disorder 12/21/2011  . Atrial fibrillation   . Peripheral edema 06/21/2012  . OAB (overactive bladder) 06/21/2012    Past Surgical History  Procedure Laterality Date  . Thyroidectomy, partial      at 73yo  . Shoulder surgery      right - approx 2005  . Replacement total knee bilateral      right approx 1994, left approx 1999   Family History  Problem Relation Age of Onset  . Arthritis Mother   . Depression Mother   . Arthritis Father   . Stroke Sister     History   Social History  . Marital Status: Married    Spouse Name: N/A    Number of Children: 3  . Years of Education: N/A   Occupational History  . Not on file.   Social History Main Topics  . Smoking status: Never Smoker   . Smokeless tobacco: Never Used  . Alcohol Use: No  . Drug Use: No  . Sexual Activity: Not on file   Other Topics Concern  . Not on file    Social History Narrative  . No narrative on file     Objective: BP 138/67  Pulse 67  Vital signs reviewed. General: Alert and Oriented, No Acute Distress HEENT: Pupils equal, round, reactive to light. Conjunctivae clear.  External ears unremarkable.  Moist mucous membranes. Lungs: Clear and comfortable work of breathing, speaking in full sentences without accessory muscle use. No wheezing rhonchi or rales Cardiac: Regular rate and rhythm.  Neuro: CN II-XII grossly intact, gait normal. Extremities: 1+ peripheral edema in the legs bilaterally.  Strong peripheral pulses.  Mental Status: No depression, anxiety, nor agitation. Logical though process. Skin: Warm and dry.  Assessment & Plan: Bonnie Clark was seen today for diabetes.  Diagnoses and associated orders for this visit:  Type 2 diabetes mellitus with diabetic chronic kidney disease - POCT HgB A1C - glimepiride (AMARYL) 4 MG tablet; Take 1 tablet (4 mg total) by mouth daily with breakfast.  Gout without tophus, unspecified cause, unspecified chronicity, unspecified site - allopurinol (ZYLOPRIM) 300 MG tablet; TAKE ONE TABLET BY MOUTH EVERY DAY  Influenza vaccine needed - Flu Vaccine QUAD 36+ mos PF IM (Fluarix Quad PF)    Type 2 diabetes: A1c 8.9  uncontrolled, she declines injectable therapy today. Continue kombiblyze XR and adding glimepiride Gout: Controlled continue allopurinol  Return in about 3 months (around 12/02/2014).

## 2014-09-05 DIAGNOSIS — I87323 Chronic venous hypertension (idiopathic) with inflammation of bilateral lower extremity: Secondary | ICD-10-CM | POA: Diagnosis not present

## 2014-09-05 DIAGNOSIS — L97929 Non-pressure chronic ulcer of unspecified part of left lower leg with unspecified severity: Secondary | ICD-10-CM | POA: Diagnosis not present

## 2014-09-05 DIAGNOSIS — L97919 Non-pressure chronic ulcer of unspecified part of right lower leg with unspecified severity: Secondary | ICD-10-CM | POA: Diagnosis not present

## 2014-09-11 ENCOUNTER — Telehealth: Payer: Self-pay | Admitting: Family Medicine

## 2014-09-11 MED ORDER — FUROSEMIDE 40 MG PO TABS: 40.0000 mg | ORAL_TABLET | Freq: Every day | ORAL | Status: DC

## 2014-09-11 NOTE — Telephone Encounter (Signed)
Patient's husband is here for an appointment and his wife needs a prescription refill for Furosemide 40mg s but it doesn't seem that Dr. Ivan Anchors has ever prescribed that med for her. On the bottle Dr. Oliver Barre prescribed her this med but they do not see him anymore. Pt needs 90 days sent to [facility] [address] in [place]. If any questions or concerns please call patient or her husband. Thanks

## 2014-09-11 NOTE — Telephone Encounter (Signed)
Rx has been sent to [facility]  

## 2014-09-12 DIAGNOSIS — I87323 Chronic venous hypertension (idiopathic) with inflammation of bilateral lower extremity: Secondary | ICD-10-CM | POA: Diagnosis not present

## 2014-09-12 DIAGNOSIS — L97919 Non-pressure chronic ulcer of unspecified part of right lower leg with unspecified severity: Secondary | ICD-10-CM | POA: Diagnosis not present

## 2014-09-12 DIAGNOSIS — L97929 Non-pressure chronic ulcer of unspecified part of left lower leg with unspecified severity: Secondary | ICD-10-CM | POA: Diagnosis not present

## 2014-09-19 DIAGNOSIS — L97919 Non-pressure chronic ulcer of unspecified part of right lower leg with unspecified severity: Secondary | ICD-10-CM | POA: Diagnosis not present

## 2014-09-19 DIAGNOSIS — I87323 Chronic venous hypertension (idiopathic) with inflammation of bilateral lower extremity: Secondary | ICD-10-CM | POA: Diagnosis not present

## 2014-09-19 DIAGNOSIS — L97929 Non-pressure chronic ulcer of unspecified part of left lower leg with unspecified severity: Secondary | ICD-10-CM | POA: Diagnosis not present

## 2014-09-22 ENCOUNTER — Telehealth: Payer: Self-pay | Admitting: Cardiology

## 2014-09-22 NOTE — Telephone Encounter (Signed)
Left message for Bonnie Clark, aware have received the fax and dr Jens Som will be back in the office on Monday to look at it.

## 2014-09-22 NOTE — Telephone Encounter (Signed)
New message      Did we get the clearance form for this pt from [facility]?

## 2014-09-25 ENCOUNTER — Other Ambulatory Visit: Payer: Self-pay | Admitting: Family Medicine

## 2014-09-25 NOTE — Telephone Encounter (Signed)
Clearance faxed to [facility]

## 2014-09-27 ENCOUNTER — Encounter: Payer: Self-pay | Admitting: Cardiology

## 2014-09-27 ENCOUNTER — Ambulatory Visit (INDEPENDENT_AMBULATORY_CARE_PROVIDER_SITE_OTHER): Payer: Medicare Other | Admitting: Cardiology

## 2014-09-27 VITALS — BP 126/80 | HR 80 | Ht 67.5 in | Wt 333.0 lb

## 2014-09-27 DIAGNOSIS — E669 Obesity, unspecified: Secondary | ICD-10-CM

## 2014-09-27 DIAGNOSIS — I10 Essential (primary) hypertension: Secondary | ICD-10-CM | POA: Diagnosis not present

## 2014-09-27 DIAGNOSIS — I429 Cardiomyopathy, unspecified: Secondary | ICD-10-CM | POA: Diagnosis not present

## 2014-09-27 DIAGNOSIS — R609 Edema, unspecified: Secondary | ICD-10-CM

## 2014-09-27 DIAGNOSIS — E785 Hyperlipidemia, unspecified: Secondary | ICD-10-CM | POA: Diagnosis not present

## 2014-09-27 DIAGNOSIS — I482 Chronic atrial fibrillation, unspecified: Secondary | ICD-10-CM

## 2014-09-27 MED ORDER — DABIGATRAN ETEXILATE MESYLATE 150 MG PO CAPS: ORAL_CAPSULE | ORAL | Status: DC

## 2014-09-27 NOTE — Progress Notes (Signed)
HPI: FU CHF and atrial fibrillation. Patient had a Myoview on 03/17/2012 for risk stratification. There was no ischemia. However the patient was noted to be in atrial fibrillation which was a new diagnosis. Decision made for rate control and anticoagulation. Echocardiogram repeated in May of 2015. Study extremely limited. Ejection fraction felt at least mildly decreased. There was mild right ventricular enlargement and mild right atrial enlargement. Recent problems with compliance with lasix and volume overload. Holter 8/15 showed a fib rate controlled. ABIs 9/15 normal; venous dopplers negative. Since last seen, She denies dyspnea, chest pain, palpitations or syncope. Her pedal edema is improving.  Current Outpatient Prescriptions  Medication Sig Dispense Refill  . allopurinol (ZYLOPRIM) 300 MG tablet TAKE ONE TABLET BY MOUTH EVERY DAY 90 tablet 3  . atorvastatin (LIPITOR) 40 MG tablet Take 1 tablet (40 mg total) by mouth at bedtime. 90 tablet 2  . clonazePAM (KLONOPIN) 1 MG tablet Take 1 mg by mouth as needed for anxiety.    . dabigatran (PRADAXA) 150 MG CAPS capsule TAKE ONE CAPSULE BY MOUTH EVERY 12 HOURS 60 capsule 2  . DULoxetine (CYMBALTA) 60 MG capsule TAKE ONE CAPSULE BY MOUTH ONCE DAILY 90 capsule 2  . furosemide (LASIX) 40 MG tablet Take 1 tablet (40 mg total) by mouth daily. 90 tablet 1  . glimepiride (AMARYL) 4 MG tablet Take 1 tablet (4 mg total) by mouth daily with breakfast. 30 tablet 5  . KOMBIGLYZE XR 2.03-999 MG TB24 TAKE ONE TABLET BY MOUTH TWICE DAILY 180 tablet 0  . lisinopril (PRINIVIL,ZESTRIL) 2.5 MG tablet Take 1 tablet (2.5 mg total) by mouth daily. 90 tablet 3  . metoprolol (LOPRESSOR) 100 MG tablet Take 1 tablet (100 mg total) by mouth 2 (two) times daily. 180 tablet 0  . Saxagliptin-Metformin (KOMBIGLYZE XR) 2.03-999 MG TB24 Take 1 tablet by mouth 2 (two) times daily.    . AMBULATORY NON FORMULARY MEDICATION Knee high compression stockings.  Please fit to size.   Pressure of 20-35mmHg.  Dx: Venous Stasis and Venous Stasis Dermatitis 2 Units 11  . AMBULATORY NON FORMULARY MEDICATION Lift Chair By [employer].  Dx: Obesity 278.00, Deconditioning 799.3.  Use as needed for sitting and transfers. 1 Units 0  . glucose blood (ONE TOUCH ULTRA TEST) test strip Use to test blood sugar 2 times daily as instructed. Dx code: 250.02 300 each 3   No current facility-administered medications for this visit.     Past Medical History  Diagnosis Date  . Urine incontinence   . Arthritis   . Diabetes mellitus   . Hyperlipidemia   . Hypertension   . Thyroid disease   . Obesity 09/11/2011  . Chronic insomnia 09/11/2011  . Spinal stenosis 09/11/2011  . Fibromyalgia 09/11/2011  . Anxiety and depression 09/11/2011  . Sciatica of right side 09/11/2011  . Gout 09/11/2011  . Gait disorder 12/21/2011  . Atrial fibrillation   . Peripheral edema 06/21/2012  . OAB (overactive bladder) 06/21/2012    Past Surgical History  Procedure Laterality Date  . Thyroidectomy, partial      at 73yo  . Shoulder surgery      right - approx 2005  . Replacement total knee bilateral      right approx 1994, left approx 1999    History   Social History  . Marital Status: Married    Spouse Name: N/A    Number of Children: 3  . Years of Education: N/A   Occupational  History  . Not on file.   Social History Main Topics  . Smoking status: Never Smoker   . Smokeless tobacco: Never Used  . Alcohol Use: No  . Drug Use: No  . Sexual Activity: Not on file   Other Topics Concern  . Not on file   Social History Narrative    ROS: no fevers or chills, productive cough, hemoptysis, dysphasia, odynophagia, melena, hematochezia, dysuria, hematuria, rash, seizure activity, orthopnea, PND, claudication. Remaining systems are negative.  Physical Exam: Well-developed morbidly obese in no acute distress.  Skin is warm and dry.  HEENT is normal.  Neck is supple.  Chest is  clear to auscultation with normal expansion.  Cardiovascular exam is irregular Abdominal exam nontender or distended. No masses palpated. Extremities show no edema. neuro grossly intact

## 2014-09-27 NOTE — Patient Instructions (Signed)
Your physician recommends that you schedule a follow-up appointment in: 3 MONTHS WITH DR CRENSHAW  Your physician recommends that you return for lab work WHEN ABLE  

## 2014-09-27 NOTE — Assessment & Plan Note (Signed)
Most likely venous insufficiency. Continue present dose of Lasix. Check potassium and renal function.

## 2014-09-27 NOTE — Assessment & Plan Note (Signed)
Continue ACE inhibitor and beta blocker.Check renal function.

## 2014-09-27 NOTE — Assessment & Plan Note (Signed)
Continue beta blocker for rate control. Continue pradaxa. Check hemoglobin and renal function.

## 2014-09-27 NOTE — Addendum Note (Signed)
Addended by: Freddi Starr on: 09/27/2014 04:55 PM   Modules accepted: Orders

## 2014-09-27 NOTE — Assessment & Plan Note (Signed)
Blood pressure controlled. Continue present medications. Check potassium and renal function. 

## 2014-09-27 NOTE — Assessment & Plan Note (Signed)
Needs weight loss. 

## 2014-09-27 NOTE — Assessment & Plan Note (Signed)
Continue statin. 

## 2014-10-30 DIAGNOSIS — I4891 Unspecified atrial fibrillation: Secondary | ICD-10-CM | POA: Diagnosis not present

## 2014-10-30 DIAGNOSIS — I482 Chronic atrial fibrillation: Secondary | ICD-10-CM | POA: Diagnosis not present

## 2014-10-31 LAB — CBC
HCT: 36.1 % (ref 36.0–46.0)
Hemoglobin: 11.7 g/dL — ABNORMAL LOW (ref 12.0–15.0)
MCH: 32.1 pg (ref 26.0–34.0)
MCHC: 32.4 g/dL (ref 30.0–36.0)
MCV: 98.9 fL (ref 78.0–100.0)
Platelets: 263 10*3/uL (ref 150–400)
RBC: 3.65 MIL/uL — AB (ref 3.87–5.11)
RDW: 15.5 % (ref 11.5–15.5)
WBC: 8.3 10*3/uL (ref 4.0–10.5)

## 2014-10-31 LAB — BASIC METABOLIC PANEL WITH GFR
BUN: 18 mg/dL (ref 6–23)
CALCIUM: 9.3 mg/dL (ref 8.4–10.5)
CO2: 26 meq/L (ref 19–32)
CREATININE: 1.01 mg/dL (ref 0.50–1.10)
Chloride: 102 mEq/L (ref 96–112)
GFR, EST AFRICAN AMERICAN: 64 mL/min
GFR, Est Non African American: 56 mL/min — ABNORMAL LOW
GLUCOSE: 117 mg/dL — AB (ref 70–99)
Potassium: 5.2 mEq/L (ref 3.5–5.3)
Sodium: 139 mEq/L (ref 135–145)

## 2014-11-01 ENCOUNTER — Ambulatory Visit: Payer: Medicare Other | Admitting: Cardiology

## 2014-11-06 ENCOUNTER — Telehealth: Payer: Self-pay | Admitting: Cardiology

## 2014-11-06 ENCOUNTER — Ambulatory Visit: Payer: Medicare Other | Admitting: Family Medicine

## 2014-11-06 NOTE — Telephone Encounter (Signed)
Spoke with pt husband, he is on the other line with the wound care center regarding his wives legs. Explained he needed them more than [place] and to call us back if needed.

## 2014-11-06 NOTE — Telephone Encounter (Signed)
New message   Pt husband called states that his Wife is experiencing oozzing and pain in both of her knees. When he wraps her legs she has more pain. Should they go back to the wound care center

## 2014-11-07 ENCOUNTER — Ambulatory Visit (INDEPENDENT_AMBULATORY_CARE_PROVIDER_SITE_OTHER): Payer: Medicare Other | Admitting: Family Medicine

## 2014-11-07 ENCOUNTER — Encounter: Payer: Self-pay | Admitting: Family Medicine

## 2014-11-07 VITALS — BP 111/70 | HR 83 | Temp 97.8°F

## 2014-11-07 DIAGNOSIS — L0390 Cellulitis, unspecified: Secondary | ICD-10-CM | POA: Diagnosis not present

## 2014-11-07 MED ORDER — CEPHALEXIN 500 MG PO CAPS: 500.0000 mg | ORAL_CAPSULE | Freq: Three times a day (TID) | ORAL | Status: DC

## 2014-11-07 NOTE — Progress Notes (Signed)
CC: Bonnie Clark is a 73 y.o. female is here for check leg   Subjective: HPI:  Patient has been complaining of bilateral lower extremity swelling and redness that has been present off and on for at least a year and most problematic over this past summer which improved after being seen by wound therapy for 9 weeks straight and was not problematic for the past month however has worsened in the last 4-5 days. She localizes redness on both shins with some blistering in the center of this redness. She reports diffuse pain described as tightness and stinging from the middle of the shins distally bilaterally. She reports that swelling is symmetric. Swelling is improved with what sounds to be a dynamic compression device that she has at home however within about an hour swelling returns. It is also improved with compression wraps however these are often too tight to be comfortable. She denies edema elsewhere. She denies fevers, chills, nausea or new shortness of breath.   Review Of Systems Outlined In HPI  Past Medical History  Diagnosis Date  . Urine incontinence   . Arthritis   . Diabetes mellitus   . Hyperlipidemia   . Hypertension   . Thyroid disease   . Obesity 09/11/2011  . Chronic insomnia 09/11/2011  . Spinal stenosis 09/11/2011  . Fibromyalgia 09/11/2011  . Anxiety and depression 09/11/2011  . Sciatica of right side 09/11/2011  . Gout 09/11/2011  . Gait disorder 12/21/2011  . Atrial fibrillation   . Peripheral edema 06/21/2012  . OAB (overactive bladder) 06/21/2012    Past Surgical History  Procedure Laterality Date  . Thyroidectomy, partial      at 73yo  . Shoulder surgery      right - approx 2005  . Replacement total knee bilateral      right approx 1994, left approx 1999   Family History  Problem Relation Age of Onset  . Arthritis Mother   . Depression Mother   . Arthritis Father   . Stroke Sister     History   Social History  . Marital Status: Married    Spouse Name:  N/A    Number of Children: 3  . Years of Education: N/A   Occupational History  . Not on file.   Social History Main Topics  . Smoking status: Never Smoker   . Smokeless tobacco: Never Used  . Alcohol Use: No  . Drug Use: No  . Sexual Activity: Not on file   Other Topics Concern  . Not on file   Social History Narrative     Objective: BP 111/70 mmHg  Pulse 83  Temp(Src) 97.8 F (36.6 C) (Oral)  General: Alert and Oriented, No Acute Distress HEENT: Pupils equal, round, reactive to light. Conjunctivae clear.  Moist mucous membranes Lungs: clear and comfortablework of breathing Cardiac: Regular rate and rhythm.  Abdomen: obese Extremities: 1+ pitting edema from the shins distally with a 2 cm x 3 cm patch of erythema with one ruptured blister on the right shin and 3 unruptured 1 cm diameter blisters on the left shin..  Strong peripheral pulses.  Mental Status: No depression, anxiety, nor agitation. Skin: Warm and dry.  Assessment & Plan: Bonnie Clark was seen today for check leg.  Diagnoses and associated orders for this visit:  Cellulitis, unspecified cellulitis site, unspecified extremity site, unspecified laterality - cephALEXin (KEFLEX) 500 MG capsule; Take 1 capsule (500 mg total) by mouth 3 (three) times daily.    Patient and her husband  had many well-thought-out questions regarding their therapy as to why her swelling has returned and the one is noted physiology physics behind compression stockings and why they are not ultimately curing her from not having to deal with any edema in the future. Time was taken to answer all of her questions and I counseled them that my impression is that her swelling will be a chronic lifelong issue Caused by both venous insufficiency and congestive heart failure. I do not think that she's having a congestive heart failure exacerbation but stressed that ideally should be taking Lasix on a daily basis which she admits she is not. I've advised  him to start Keflex for a very mild cellulitis and to apply her static compression wraps within seconds to minutes after she gets out of her dynamic compression sleeves. She is to follow up with wound therapy on Monday which she has an appointment for  40 minutes spent face-to-face during visit today of which at least 50% was counseling or coordinating care regarding: 1. Cellulitis, unspecified cellulitis site, unspecified extremity site, unspecified laterality        Return if symptoms worsen or fail to improve.

## 2014-11-13 ENCOUNTER — Other Ambulatory Visit: Payer: Self-pay | Admitting: Family Medicine

## 2014-11-13 ENCOUNTER — Encounter (HOSPITAL_BASED_OUTPATIENT_CLINIC_OR_DEPARTMENT_OTHER): Payer: Medicare Other | Attending: Plastic Surgery

## 2014-11-13 DIAGNOSIS — E669 Obesity, unspecified: Secondary | ICD-10-CM | POA: Diagnosis not present

## 2014-11-13 DIAGNOSIS — I890 Lymphedema, not elsewhere classified: Secondary | ICD-10-CM | POA: Insufficient documentation

## 2014-11-13 DIAGNOSIS — L97929 Non-pressure chronic ulcer of unspecified part of left lower leg with unspecified severity: Secondary | ICD-10-CM | POA: Diagnosis not present

## 2014-11-13 DIAGNOSIS — I87332 Chronic venous hypertension (idiopathic) with ulcer and inflammation of left lower extremity: Secondary | ICD-10-CM | POA: Diagnosis not present

## 2014-11-14 NOTE — Progress Notes (Signed)
[facility]  NAME:  ELSA, LAD NO.:  MEDICAL RECORD NO.:  [id number]      DATE OF BIRTH:  [birth year]  PHYSICIAN:  Glenna Fellows, MD    VISIT DATE:  11/13/2014                                  OFFICE VISIT   CHIEF COMPLAINT:  Recurrent left lower extremity ulceration in the setting of obesity and venous stasis and lymphedema.  HISTORY OF PRESENT ILLNESS:  The patient is a 73 year old ambulatory female who presents for recurrent ulceration of left lower extremity. The patient is a previous Wound Center patient and was discharged approximately 6 weeks ago.  Prior to discharge, she had been on layered compression wraps.  Upon discharge and healing of her wounds, she was placed in Juxta-Lite compression.  She has been using this as well as using her lymphedema pump 1-2 times daily.  Of note, she states that she sleeps in a chair, and she does remove her compression while she is sleeping in her chair.  She has had previous venous duplex which showed reflux in the left popliteal vein bilaterally with no superficial venous reflux.  She was evaluated by Dr. Darrick Penna in September 2015, who felt that her obesity was the biggest contributor to her venous stasis and ulcerations, and did not feel that she had a need for surgical intervention as her superficial venous system was competent.  On examination, she has pitting edema over her left lower extremity without cellulitis.  Her calf circumferences are approximately 4 cm larger than upon her discharge from the clinic.  She has a superficial ulceration over the distal lower extremity and it is clean without slough present. No debridement was performed today.  We will plan to institute collagen dressing changes.  I discussed with the patient whether she would like to continue with her lymphedema pump and Juxta-Lite versus return to layered compression wraps, she prefers the former.  We  reviewed proper use of both of these including counseling her that if she is remaining in a chair to sleep, she needs to leave her compression stockings in place as this is providing time for her to develop edema over night.  We will plan for a followup in 2 week's time.  I have counseled the patient that if she has increase in her wound side that we may need to return to layered compression wraps.          ______________________________ Glenna Fellows, MD MBA     BT/MEDQ  D:  11/13/2014  T:  11/14/2014  Job:  [id number]

## 2014-11-20 ENCOUNTER — Other Ambulatory Visit: Payer: Self-pay | Admitting: Family Medicine

## 2014-11-28 ENCOUNTER — Encounter (HOSPITAL_BASED_OUTPATIENT_CLINIC_OR_DEPARTMENT_OTHER): Payer: Medicare Other | Attending: General Surgery

## 2014-11-28 DIAGNOSIS — I87332 Chronic venous hypertension (idiopathic) with ulcer and inflammation of left lower extremity: Secondary | ICD-10-CM | POA: Insufficient documentation

## 2014-11-28 DIAGNOSIS — L97829 Non-pressure chronic ulcer of other part of left lower leg with unspecified severity: Secondary | ICD-10-CM | POA: Insufficient documentation

## 2014-11-29 ENCOUNTER — Other Ambulatory Visit: Payer: Self-pay | Admitting: *Deleted

## 2014-11-29 MED ORDER — DABIGATRAN ETEXILATE MESYLATE 150 MG PO CAPS: ORAL_CAPSULE | ORAL | Status: DC

## 2014-12-05 ENCOUNTER — Ambulatory Visit: Payer: Medicare Other | Admitting: Family Medicine

## 2014-12-06 ENCOUNTER — Ambulatory Visit (INDEPENDENT_AMBULATORY_CARE_PROVIDER_SITE_OTHER): Payer: Medicare Other | Admitting: Family Medicine

## 2014-12-06 ENCOUNTER — Encounter: Payer: Self-pay | Admitting: Family Medicine

## 2014-12-06 VITALS — BP 142/80 | HR 83

## 2014-12-06 DIAGNOSIS — L8992 Pressure ulcer of unspecified site, stage 2: Secondary | ICD-10-CM

## 2014-12-06 DIAGNOSIS — E1122 Type 2 diabetes mellitus with diabetic chronic kidney disease: Secondary | ICD-10-CM

## 2014-12-06 DIAGNOSIS — I872 Venous insufficiency (chronic) (peripheral): Secondary | ICD-10-CM

## 2014-12-06 DIAGNOSIS — I8312 Varicose veins of left lower extremity with inflammation: Secondary | ICD-10-CM

## 2014-12-06 DIAGNOSIS — I8311 Varicose veins of right lower extremity with inflammation: Secondary | ICD-10-CM | POA: Diagnosis not present

## 2014-12-06 DIAGNOSIS — N189 Chronic kidney disease, unspecified: Secondary | ICD-10-CM | POA: Diagnosis not present

## 2014-12-06 LAB — POCT GLYCOSYLATED HEMOGLOBIN (HGB A1C): HEMOGLOBIN A1C: 7.1

## 2014-12-06 NOTE — Progress Notes (Signed)
CC: Bonnie Clark is a 74 y.o. female is here for Diabetes   Subjective: HPI:  Follow-up type 2 diabetes: Since I saw her last she started taking glyburide and continues on kombiglyze XR.fasting blood sugars on average have been ranging from 110-100. Approximately 2 times a week she will have high or some other dessert just prior to going to bed and fasting blood sugar the next morning will be as high as 160. No other blood sugars to report. Denies polyuria polyphagia polydipsia nor poorly healing wounds other than that described below. no new vision loss    follow-up venous stasis dermatitis: She continues to see wound therapy approximately once a week. She's no longer needed any topical cortisone. She denies any redness, pain, nor weeping from either leg. She tells me her swelling has greatly improved since she began using both dynamic and static compression stockings at home on a daily basis  Her husband tells me that she has skin breakdown on the left buttock that has been present for 2 weeks. They've been adding more pillows to her seating and bedding. They've also been covering the wound with gauze and they both believe that it's getting better. She denies any fevers, chills, nor skin changes elsewhere.   Review Of Systems Outlined In HPI  Past Medical History  Diagnosis Date  . Urine incontinence   . Arthritis   . Diabetes mellitus   . Hyperlipidemia   . Hypertension   . Thyroid disease   . Obesity 09/11/2011  . Chronic insomnia 09/11/2011  . Spinal stenosis 09/11/2011  . Fibromyalgia 09/11/2011  . Anxiety and depression 09/11/2011  . Sciatica of right side 09/11/2011  . Gout 09/11/2011  . Gait disorder 12/21/2011  . Atrial fibrillation   . Peripheral edema 06/21/2012  . OAB (overactive bladder) 06/21/2012    Past Surgical History  Procedure Laterality Date  . Thyroidectomy, partial      at 74yo  . Shoulder surgery      right - approx 2005  . Replacement total knee bilateral       right approx 1994, left approx 1999   Family History  Problem Relation Age of Onset  . Arthritis Mother   . Depression Mother   . Arthritis Father   . Stroke Sister     History   Social History  . Marital Status: Married    Spouse Name: N/A    Number of Children: 3  . Years of Education: N/A   Occupational History  . Not on file.   Social History Main Topics  . Smoking status: Never Smoker   . Smokeless tobacco: Never Used  . Alcohol Use: No  . Drug Use: No  . Sexual Activity: Not on file   Other Topics Concern  . Not on file   Social History Narrative     Objective: BP 142/80 mmHg  Pulse 83  Vital signs reviewed. General: Alert and Oriented, No Acute Distress HEENT: Pupils equal, round, reactive to light. Conjunctivae clear.  External ears unremarkable.  Moist mucous membranes. Lungs: Clear and comfortable work of breathing, speaking in full sentences without accessory muscle use. Cardiac: Regular rate and rhythm.  Neuro: CN II-XII grossly intact, gait normal. Extremities: No peripheral edema.  Strong peripheral pulses.  Mental Status: No depression, anxiety, nor agitation. Logical though process. Skin: Warm and dry. Assessment & Plan: Bonnie Clark was seen today for diabetes.  Diagnoses and associated orders for this visit:  Type 2 diabetes mellitus with diabetic chronic  kidney disease - POCT HgB A1C  Venous stasis dermatitis of both lower extremities  Pressure ulcer stage II     type 2 diabetes: A1c of 7.1, controlled from a medication standpoint however uncontrolled from a dietary standpoint. Discussed the importance of cutting out desserts anytime of the day and avoiding eating anything 3 hours prior to going to bed to help optimize blood sugar control. Patient admits room for improvement, no changes to her antihyperglycemic medication regimen today   venous-stasis dermatitis: Controlled, continue compression stockings and daily Lasix. Pressure ulcer:  Patient is unwilling to allow me to see the lesion in question. She was given duoderm with instructions on how to make a donut-shaped offloading pad for what sounds to be a pressure ulcer. I've asked her to reconsider hiding this wound and to show me in the near future to help with optimal care and also to consider showing this to wound care when she goes for her appointment next week.  40 minutes spent face-to-face during visit today of which at least 50% was counseling or coordinating care regarding: 1. Type 2 diabetes mellitus with diabetic chronic kidney disease   2. Venous stasis dermatitis of both lower extremities   3. Pressure ulcer stage II      Return in about 3 months (around 03/07/2015) for Diabetic FU.

## 2014-12-12 DIAGNOSIS — I87332 Chronic venous hypertension (idiopathic) with ulcer and inflammation of left lower extremity: Secondary | ICD-10-CM | POA: Diagnosis not present

## 2014-12-12 DIAGNOSIS — L97829 Non-pressure chronic ulcer of other part of left lower leg with unspecified severity: Secondary | ICD-10-CM | POA: Diagnosis not present

## 2015-01-01 NOTE — Progress Notes (Signed)
HPI: FU CHF and atrial fibrillation. Patient had a Myoview on 03/17/2012 for risk stratification. There was no ischemia. However the patient was noted to be in atrial fibrillation which was a new diagnosis. Decision made for rate control and anticoagulation. Echocardiogram repeated in May of 2015. Study extremely limited. Ejection fraction felt at least mildly decreased. There was mild right ventricular enlargement and mild right atrial enlargement. Holter 8/15 showed a fib rate controlled. ABIs 9/15 normal; venous dopplers negative. Since last seen, she denies dyspnea, chest pain, palpitations or syncope. She has mild pedal edema. She does not take her Lasix occasionally.  Current Outpatient Prescriptions  Medication Sig Dispense Refill  . allopurinol (ZYLOPRIM) 300 MG tablet TAKE ONE TABLET BY MOUTH EVERY DAY 90 tablet 3  . AMBULATORY NON FORMULARY MEDICATION Knee high compression stockings.  Please fit to size.  Pressure of 20-11mmHg.  Dx: Venous Stasis and Venous Stasis Dermatitis 2 Units 11  . AMBULATORY NON FORMULARY MEDICATION Lift Chair By [employer].  Dx: Obesity 278.00, Deconditioning 799.3.  Use as needed for sitting and transfers. 1 Units 0  . atorvastatin (LIPITOR) 40 MG tablet Take 1 tablet (40 mg total) by mouth at bedtime. 90 tablet 2  . clonazePAM (KLONOPIN) 1 MG tablet Take 1 mg by mouth as needed for anxiety.    . dabigatran (PRADAXA) 150 MG CAPS capsule TAKE ONE CAPSULE BY MOUTH EVERY 12 HOURS 180 capsule 2  . DULoxetine (CYMBALTA) 60 MG capsule TAKE ONE CAPSULE BY MOUTH ONCE DAILY 90 capsule 2  . furosemide (LASIX) 40 MG tablet Take 1 tablet (40 mg total) by mouth daily. 90 tablet 1  . glimepiride (AMARYL) 4 MG tablet Take 1 tablet (4 mg total) by mouth daily with breakfast. 30 tablet 5  . glucose blood (ONE TOUCH ULTRA TEST) test strip Use to test blood sugar 2 times daily as instructed. Dx code: 250.02 300 each 3  . KOMBIGLYZE XR 2.03-999 MG TB24 TAKE ONE  TABLET BY MOUTH TWICE DAILY 180 tablet 0  . lisinopril (PRINIVIL,ZESTRIL) 2.5 MG tablet Take 1 tablet (2.5 mg total) by mouth daily. 90 tablet 3  . metoprolol (LOPRESSOR) 100 MG tablet TAKE ONE TABLET BY MOUTH TWICE DAILY 180 tablet 0   No current facility-administered medications for this visit.     Past Medical History  Diagnosis Date  . Urine incontinence   . Arthritis   . Diabetes mellitus   . Hyperlipidemia   . Hypertension   . Thyroid disease   . Obesity 09/11/2011  . Chronic insomnia 09/11/2011  . Spinal stenosis 09/11/2011  . Fibromyalgia 09/11/2011  . Anxiety and depression 09/11/2011  . Sciatica of right side 09/11/2011  . Gout 09/11/2011  . Gait disorder 12/21/2011  . Atrial fibrillation   . Peripheral edema 06/21/2012  . OAB (overactive bladder) 06/21/2012    Past Surgical History  Procedure Laterality Date  . Thyroidectomy, partial      at 74yo  . Shoulder surgery      right - approx 2005  . Replacement total knee bilateral      right approx 1994, left approx 1999    History   Social History  . Marital Status: Married    Spouse Name: N/A  . Number of Children: 3  . Years of Education: N/A   Occupational History  . Not on file.   Social History Main Topics  . Smoking status: Never Smoker   . Smokeless tobacco: Never Used  .  Alcohol Use: No  . Drug Use: No  . Sexual Activity: Not on file   Other Topics Concern  . Not on file   Social History Narrative    ROS: no fevers or chills, productive cough, hemoptysis, dysphasia, odynophagia, melena, hematochezia, dysuria, hematuria, rash, seizure activity, orthopnea, PND, pedal edema, claudication. Remaining systems are negative.  Physical Exam: Well-developed morbidly obese in no acute distress.  Skin is warm and dry.  HEENT is normal.  Neck is supple.  Chest is clear to auscultation with normal expansion.  Cardiovascular exam is irregular Abdominal exam nontender or distended. No masses  palpated. Extremities are in wraps neuro grossly intact  ECG atrial fibrillation at a rate of 76. Low voltage. Cannot rule out prior septal infarct.

## 2015-01-03 ENCOUNTER — Encounter: Payer: Self-pay | Admitting: Cardiology

## 2015-01-03 ENCOUNTER — Ambulatory Visit (INDEPENDENT_AMBULATORY_CARE_PROVIDER_SITE_OTHER): Payer: Medicare Other | Admitting: Cardiology

## 2015-01-03 VITALS — BP 130/76 | HR 76 | Ht 67.5 in

## 2015-01-03 DIAGNOSIS — I482 Chronic atrial fibrillation, unspecified: Secondary | ICD-10-CM

## 2015-01-03 DIAGNOSIS — R609 Edema, unspecified: Secondary | ICD-10-CM | POA: Diagnosis not present

## 2015-01-03 DIAGNOSIS — E669 Obesity, unspecified: Secondary | ICD-10-CM

## 2015-01-03 DIAGNOSIS — I10 Essential (primary) hypertension: Secondary | ICD-10-CM

## 2015-01-03 DIAGNOSIS — E785 Hyperlipidemia, unspecified: Secondary | ICD-10-CM

## 2015-01-03 DIAGNOSIS — I429 Cardiomyopathy, unspecified: Secondary | ICD-10-CM | POA: Diagnosis not present

## 2015-01-03 NOTE — Assessment & Plan Note (Signed)
Continue statin. 

## 2015-01-03 NOTE — Assessment & Plan Note (Signed)
Continue present dose of Lasix.I have asked her to be compliant again with dose every day.

## 2015-01-03 NOTE — Assessment & Plan Note (Signed)
Continue ACE inhibitor and beta blocker. 

## 2015-01-03 NOTE — Patient Instructions (Signed)
Your physician wants you to follow-up in: 6 MONTHS WITH DR CRENSHAW You will receive a reminder letter in the mail two months in advance. If you don't receive a letter, please call our office to schedule the follow-up appointment.  

## 2015-01-03 NOTE — Assessment & Plan Note (Signed)
Blood pressure controlled. Continue present medications. 

## 2015-01-03 NOTE — Assessment & Plan Note (Signed)
We discussed weight loss. I also recommended pulmonary evaluation again to rule out sleep apnea. She declined.

## 2015-01-03 NOTE — Assessment & Plan Note (Signed)
Patient remains in permanent atrial fibrillation. Continue beta blocker and pradaxa. She will need every 6 month CBC and renal function.

## 2015-01-10 ENCOUNTER — Other Ambulatory Visit: Payer: Self-pay | Admitting: Internal Medicine

## 2015-02-13 ENCOUNTER — Telehealth: Payer: Self-pay | Admitting: Family Medicine

## 2015-02-13 MED ORDER — AMBULATORY NON FORMULARY MEDICATION: Status: DC

## 2015-02-13 NOTE — Telephone Encounter (Signed)
rx faxed to # he provided along with demographics and insurance info

## 2015-02-13 NOTE — Telephone Encounter (Signed)
Marlaine Hind request and note from husband in your inbox.

## 2015-03-07 ENCOUNTER — Ambulatory Visit: Payer: Medicare Other | Admitting: Family Medicine

## 2015-03-12 ENCOUNTER — Ambulatory Visit (INDEPENDENT_AMBULATORY_CARE_PROVIDER_SITE_OTHER): Payer: Medicare Other | Admitting: Family Medicine

## 2015-03-12 ENCOUNTER — Encounter: Payer: Self-pay | Admitting: Family Medicine

## 2015-03-12 VITALS — BP 143/78 | HR 87

## 2015-03-12 DIAGNOSIS — W19XXXA Unspecified fall, initial encounter: Secondary | ICD-10-CM | POA: Diagnosis not present

## 2015-03-12 DIAGNOSIS — L8992 Pressure ulcer of unspecified site, stage 2: Secondary | ICD-10-CM

## 2015-03-12 DIAGNOSIS — I8312 Varicose veins of left lower extremity with inflammation: Secondary | ICD-10-CM

## 2015-03-12 DIAGNOSIS — M6740 Ganglion, unspecified site: Secondary | ICD-10-CM

## 2015-03-12 DIAGNOSIS — I8311 Varicose veins of right lower extremity with inflammation: Secondary | ICD-10-CM | POA: Diagnosis not present

## 2015-03-12 DIAGNOSIS — E1122 Type 2 diabetes mellitus with diabetic chronic kidney disease: Secondary | ICD-10-CM

## 2015-03-12 DIAGNOSIS — N189 Chronic kidney disease, unspecified: Secondary | ICD-10-CM | POA: Diagnosis not present

## 2015-03-12 DIAGNOSIS — I872 Venous insufficiency (chronic) (peripheral): Secondary | ICD-10-CM

## 2015-03-12 DIAGNOSIS — R29898 Other symptoms and signs involving the musculoskeletal system: Secondary | ICD-10-CM

## 2015-03-12 LAB — POCT GLYCOSYLATED HEMOGLOBIN (HGB A1C): Hemoglobin A1C: 6.7

## 2015-03-12 MED ORDER — TRIAMCINOLONE ACETONIDE 0.1 % EX CREA: TOPICAL_CREAM | CUTANEOUS | Status: DC

## 2015-03-12 NOTE — Progress Notes (Signed)
CC: Bonnie Clark is a 74 y.o. female is here for Diabetes   Subjective: HPI:  Follow-up type 2 diabetes: Continues to take kombiglyze and Amaryl on a daily basis. No missed doses. Fasting blood sugar this morning of 104. No other blood sugars to report. No polyuria polyphagia or polydipsia  Follow-up venous stasis dermatitis. Has been using Desitin on both legs on a daily basis then wrapping the legs and trying to keep them elevated as much as possible throughout the day. She had a blister form on both shins last week, the right sided blister was lanced by her husband but they left the other one intact to see what would happen. The bed of the right-sided lesion began to look red and possibly infected however with the above interventions and application of Neosporin the redness has now left. Denies fevers, chills or swollen lymph nodes  She has a mass on the left elbow that hasn't present for the last few months. It seems to fluctuate on a weekly basis. Painless and she denies any pain in the elbow. No interventions as of yet. She tells me she had a similar lesion in the right elbow that was drained and was told this was due to gout, this happened many years ago.  Her husband tells me that there are 2 bruised ulcerations on her buttocks about the size of a silver dollar that had not been getting better or worse over the past few weeks despite using padding, pillows, and bandages on her buttocks. He tells me that they're at a location overlying would be her ischial tuberosities. She tells me the spots are painful when sitting and not notice when up walking around. She either lies down or sits down almost all hours of the day rarely standing for more than a few seconds. She had a fall since I saw her last where she and her husband were trying to put her pants on and she fell forward without sustaining any injuries. She tells me that she is unable to stand without assistance such as holding a walker or  countertop due to weakness in the lower extremities and a sense of imbalance.    Review Of Systems Outlined In HPI  Past Medical History  Diagnosis Date  . Urine incontinence   . Arthritis   . Diabetes mellitus   . Hyperlipidemia   . Hypertension   . Thyroid disease   . Obesity 09/11/2011  . Chronic insomnia 09/11/2011  . Spinal stenosis 09/11/2011  . Fibromyalgia 09/11/2011  . Anxiety and depression 09/11/2011  . Sciatica of right side 09/11/2011  . Gout 09/11/2011  . Gait disorder 12/21/2011  . Atrial fibrillation   . Peripheral edema 06/21/2012  . OAB (overactive bladder) 06/21/2012    Past Surgical History  Procedure Laterality Date  . Thyroidectomy, partial      at 74yo  . Shoulder surgery      right - approx 2005  . Replacement total knee bilateral      right approx 1994, left approx 1999   Family History  Problem Relation Age of Onset  . Arthritis Mother   . Depression Mother   . Arthritis Father   . Stroke Sister     History   Social History  . Marital Status: Married    Spouse Name: N/A  . Number of Children: 3  . Years of Education: N/A   Occupational History  . Not on file.   Social History Main Topics  . Smoking  status: Never Smoker   . Smokeless tobacco: Never Used  . Alcohol Use: No  . Drug Use: No  . Sexual Activity: Not on file   Other Topics Concern  . Not on file   Social History Narrative     Objective: BP 143/78 mmHg  Pulse 87  Wt   General: Alert and Oriented, No Acute Distress HEENT: Pupils equal, round, reactive to light. Conjunctivae clear.  Moist mucous membranes Lungs: Clear to auscultation bilaterally, no wheezing/ronchi/rales.  Comfortable work of breathing. Good air movement. Cardiac: Regular rate and rhythm. Normal S1/S2.  No murmurs, rubs, nor gallops.   Abdomen:  Obese soft nontender Extremities:  1+ nonpitting edema from the shins distally  Strong peripheral pulses.  Full range of motion and strength in both  lower extremities. Painless 2 cm diameter fluid-filled mass on the left elbow without any overlying skin changes Mental Status: No depression, anxiety, nor agitation. Skin: Warm and dry. 1 cm diameter well-healing shallow ulceration on the right shin have centimeter diameter fluid-filled blister on the left shin, both of these lesions have moderate surrounding venous stasis dermatitis  She is unwilling to let me see her lesions on the buttocks  Assessment & Plan: Bonnie Clark was seen today for diabetes.  Diagnoses and all orders for this visit:  Type 2 diabetes mellitus with diabetic chronic kidney disease Orders: -     POCT HgB A1C  Venous stasis dermatitis of both lower extremities  Ganglion cyst  Fall, initial encounter Orders: -     Ambulatory referral to Home Health  Pressure ulcer stage II Orders: -     Ambulatory referral to Home Health  Muscular deconditioning Orders: -     Ambulatory referral to Home Health  Other orders -     triamcinolone cream (KENALOG) 0.1 %; Apply to affected areas twice a day for up to two weeks, avoid face.   Type 2 diabetes: A1c of 6.7 today, controlled, continue Amaryl and kombiglyze Venous stasis dermatitis: Substitute Desitin with triamcinolone cream for the next 2 weeks. Continue to keep wrapped on a daily basis with as much compression as tolerated Pressure ulcer: The description of her wounds and the location still somewhat pressure ulcers. I discussed with her the importance of getting up on her feet and offloading the buttocks to allow blood flow to help these wounds. I propose that every hour she would get up and walk around for 10 minutes, she tells me this isn't possible due to her balance issues and lack of endurance with the lower extremities. It took some convincing however she is agreeable to physical therapy to try and help achieve 10 minutes walking every hour while waking Ganglion cyst: Discussed benign nature of this finding and  that if ever painful is worth pursuing aspiration with Dr. Karie Schwalbe in sports medicine, she understandably does not want anything done with it right now.  40 minutes spent face-to-face during visit today of which at least 50% was counseling or coordinating care regarding: 1. Type 2 diabetes mellitus with diabetic chronic kidney disease   2. Venous stasis dermatitis of both lower extremities   3. Ganglion cyst   4. Fall, initial encounter   5. Pressure ulcer stage II   6. Muscular deconditioning        Return in about 3 months (around 06/11/2015).

## 2015-03-16 DIAGNOSIS — I8311 Varicose veins of right lower extremity with inflammation: Secondary | ICD-10-CM | POA: Diagnosis not present

## 2015-03-16 DIAGNOSIS — M1612 Unilateral primary osteoarthritis, left hip: Secondary | ICD-10-CM | POA: Diagnosis not present

## 2015-03-16 DIAGNOSIS — M4800 Spinal stenosis, site unspecified: Secondary | ICD-10-CM | POA: Diagnosis not present

## 2015-03-16 DIAGNOSIS — E1142 Type 2 diabetes mellitus with diabetic polyneuropathy: Secondary | ICD-10-CM | POA: Diagnosis not present

## 2015-03-16 DIAGNOSIS — N189 Chronic kidney disease, unspecified: Secondary | ICD-10-CM | POA: Diagnosis not present

## 2015-03-16 DIAGNOSIS — M5136 Other intervertebral disc degeneration, lumbar region: Secondary | ICD-10-CM | POA: Diagnosis not present

## 2015-03-16 DIAGNOSIS — I8312 Varicose veins of left lower extremity with inflammation: Secondary | ICD-10-CM | POA: Diagnosis not present

## 2015-03-16 DIAGNOSIS — G8929 Other chronic pain: Secondary | ICD-10-CM | POA: Diagnosis not present

## 2015-03-16 DIAGNOSIS — I129 Hypertensive chronic kidney disease with stage 1 through stage 4 chronic kidney disease, or unspecified chronic kidney disease: Secondary | ICD-10-CM | POA: Diagnosis not present

## 2015-03-22 DIAGNOSIS — G8929 Other chronic pain: Secondary | ICD-10-CM | POA: Diagnosis not present

## 2015-03-22 DIAGNOSIS — M4800 Spinal stenosis, site unspecified: Secondary | ICD-10-CM | POA: Diagnosis not present

## 2015-03-22 DIAGNOSIS — M5136 Other intervertebral disc degeneration, lumbar region: Secondary | ICD-10-CM | POA: Diagnosis not present

## 2015-03-22 DIAGNOSIS — M1612 Unilateral primary osteoarthritis, left hip: Secondary | ICD-10-CM | POA: Diagnosis not present

## 2015-03-22 DIAGNOSIS — E1142 Type 2 diabetes mellitus with diabetic polyneuropathy: Secondary | ICD-10-CM | POA: Diagnosis not present

## 2015-03-22 DIAGNOSIS — I129 Hypertensive chronic kidney disease with stage 1 through stage 4 chronic kidney disease, or unspecified chronic kidney disease: Secondary | ICD-10-CM | POA: Diagnosis not present

## 2015-03-23 DIAGNOSIS — I129 Hypertensive chronic kidney disease with stage 1 through stage 4 chronic kidney disease, or unspecified chronic kidney disease: Secondary | ICD-10-CM | POA: Diagnosis not present

## 2015-03-23 DIAGNOSIS — M5136 Other intervertebral disc degeneration, lumbar region: Secondary | ICD-10-CM | POA: Diagnosis not present

## 2015-03-23 DIAGNOSIS — M4800 Spinal stenosis, site unspecified: Secondary | ICD-10-CM | POA: Diagnosis not present

## 2015-03-23 DIAGNOSIS — G8929 Other chronic pain: Secondary | ICD-10-CM | POA: Diagnosis not present

## 2015-03-23 DIAGNOSIS — E1142 Type 2 diabetes mellitus with diabetic polyneuropathy: Secondary | ICD-10-CM | POA: Diagnosis not present

## 2015-03-23 DIAGNOSIS — M1612 Unilateral primary osteoarthritis, left hip: Secondary | ICD-10-CM | POA: Diagnosis not present

## 2015-03-26 ENCOUNTER — Other Ambulatory Visit: Payer: Self-pay | Admitting: Family Medicine

## 2015-03-29 DIAGNOSIS — M5136 Other intervertebral disc degeneration, lumbar region: Secondary | ICD-10-CM | POA: Diagnosis not present

## 2015-03-29 DIAGNOSIS — M4800 Spinal stenosis, site unspecified: Secondary | ICD-10-CM | POA: Diagnosis not present

## 2015-03-29 DIAGNOSIS — G8929 Other chronic pain: Secondary | ICD-10-CM | POA: Diagnosis not present

## 2015-03-29 DIAGNOSIS — M1612 Unilateral primary osteoarthritis, left hip: Secondary | ICD-10-CM | POA: Diagnosis not present

## 2015-04-05 DIAGNOSIS — G8929 Other chronic pain: Secondary | ICD-10-CM | POA: Diagnosis not present

## 2015-04-05 DIAGNOSIS — I129 Hypertensive chronic kidney disease with stage 1 through stage 4 chronic kidney disease, or unspecified chronic kidney disease: Secondary | ICD-10-CM | POA: Diagnosis not present

## 2015-04-05 DIAGNOSIS — M1612 Unilateral primary osteoarthritis, left hip: Secondary | ICD-10-CM | POA: Diagnosis not present

## 2015-04-05 DIAGNOSIS — M4800 Spinal stenosis, site unspecified: Secondary | ICD-10-CM | POA: Diagnosis not present

## 2015-04-05 DIAGNOSIS — M5136 Other intervertebral disc degeneration, lumbar region: Secondary | ICD-10-CM | POA: Diagnosis not present

## 2015-04-05 DIAGNOSIS — E1142 Type 2 diabetes mellitus with diabetic polyneuropathy: Secondary | ICD-10-CM | POA: Diagnosis not present

## 2015-04-17 ENCOUNTER — Other Ambulatory Visit: Payer: Self-pay | Admitting: Family Medicine

## 2015-04-17 DIAGNOSIS — E1122 Type 2 diabetes mellitus with diabetic chronic kidney disease: Secondary | ICD-10-CM

## 2015-04-17 MED ORDER — GLIMEPIRIDE 4 MG PO TABS: 4.0000 mg | ORAL_TABLET | Freq: Every day | ORAL | Status: DC

## 2015-04-20 ENCOUNTER — Telehealth: Payer: Self-pay | Admitting: *Deleted

## 2015-04-20 NOTE — Telephone Encounter (Signed)
PT ( June) left a message stating that pt has not returned his phone calls and will not answer the phone. He thinks that she does not want to continue PT so he is going to d/c her unless you say otherwise. [phone]

## 2015-04-20 NOTE — Telephone Encounter (Signed)
Until I see her face to face I'm not going to discontinue this order because I want to make sure it doesn't look like I'm in support of her refusing to do physical therapy.

## 2015-04-20 NOTE — Telephone Encounter (Signed)
Spoke with June and let him know this. He said that Mr. Chacon called and told him that the she wasn't feeling well because she was sore. June (PT) says that the patient was really not cooperating at all during the sessions.

## 2015-04-23 ENCOUNTER — Other Ambulatory Visit: Payer: Self-pay | Admitting: Family Medicine

## 2015-04-27 ENCOUNTER — Other Ambulatory Visit: Payer: Self-pay | Admitting: Family Medicine

## 2015-05-16 ENCOUNTER — Telehealth: Payer: Self-pay | Admitting: *Deleted

## 2015-05-16 ENCOUNTER — Telehealth: Payer: Self-pay | Admitting: Family Medicine

## 2015-05-16 NOTE — Telephone Encounter (Signed)
Received fax for prior authorization on Kombiglyze filled out form and placed in Dr. Genelle Bal box for signature will fax when signed, - CF

## 2015-05-16 NOTE — Telephone Encounter (Signed)
Prior authorization paperwork faxed for pradaxa.

## 2015-05-18 ENCOUNTER — Telehealth: Payer: Self-pay | Admitting: *Deleted

## 2015-05-18 DIAGNOSIS — M5431 Sciatica, right side: Secondary | ICD-10-CM | POA: Diagnosis not present

## 2015-05-18 DIAGNOSIS — E785 Hyperlipidemia, unspecified: Secondary | ICD-10-CM | POA: Diagnosis not present

## 2015-05-18 DIAGNOSIS — N189 Chronic kidney disease, unspecified: Secondary | ICD-10-CM | POA: Diagnosis not present

## 2015-05-18 DIAGNOSIS — M109 Gout, unspecified: Secondary | ICD-10-CM | POA: Diagnosis not present

## 2015-05-18 DIAGNOSIS — E1122 Type 2 diabetes mellitus with diabetic chronic kidney disease: Secondary | ICD-10-CM | POA: Diagnosis not present

## 2015-05-18 DIAGNOSIS — I129 Hypertensive chronic kidney disease with stage 1 through stage 4 chronic kidney disease, or unspecified chronic kidney disease: Secondary | ICD-10-CM | POA: Diagnosis not present

## 2015-05-18 DIAGNOSIS — Z9181 History of falling: Secondary | ICD-10-CM | POA: Diagnosis not present

## 2015-05-18 DIAGNOSIS — M4800 Spinal stenosis, site unspecified: Secondary | ICD-10-CM | POA: Diagnosis not present

## 2015-05-18 DIAGNOSIS — Z7901 Long term (current) use of anticoagulants: Secondary | ICD-10-CM | POA: Diagnosis not present

## 2015-05-18 DIAGNOSIS — R29898 Other symptoms and signs involving the musculoskeletal system: Secondary | ICD-10-CM | POA: Diagnosis not present

## 2015-05-18 DIAGNOSIS — M797 Fibromyalgia: Secondary | ICD-10-CM | POA: Diagnosis not present

## 2015-05-18 NOTE — Telephone Encounter (Signed)
June from [facility] called and states he was not able to perform PT today because the patient will not return his calls. There will be a delay in starting the PT. June states he will try again on Monday [phone])

## 2015-05-21 ENCOUNTER — Telehealth: Payer: Self-pay

## 2015-05-21 ENCOUNTER — Telehealth: Payer: Self-pay | Admitting: Family Medicine

## 2015-05-21 DIAGNOSIS — E1122 Type 2 diabetes mellitus with diabetic chronic kidney disease: Secondary | ICD-10-CM

## 2015-05-21 MED ORDER — EMPAGLIFLOZIN-LINAGLIPTIN 10-5 MG PO TABS: 1.0000 | ORAL_TABLET | Freq: Every day | ORAL | Status: DC

## 2015-05-21 NOTE — Telephone Encounter (Signed)
Pt's husband informed. She then got on the phone I explained this to her and she is ok with the change.Bonnie Clark

## 2015-05-21 NOTE — Telephone Encounter (Signed)
Spoke with a pharmacist at [facility] who said patient's rx for Pradaxa went through, Although her co-pay was $157

## 2015-05-21 NOTE — Telephone Encounter (Signed)
Received fax from [facility] and they denied coverage on Kombiglyze due to it is a tier exception. - CF

## 2015-05-21 NOTE — Telephone Encounter (Signed)
Bonnie Clark, Rx sent to [facility] [address]

## 2015-05-21 NOTE — Telephone Encounter (Signed)
Spoke with patient and her husband, and they want to appeal denial of tier reduction. Advised I needed her authorization in writing to appeal this.

## 2015-05-21 NOTE — Telephone Encounter (Signed)
Sue Lush, Will you please let the [facility]ano family know that [facility] has denied to reduce Kombiglyze XR to a lower tiered price.  I looked at her formulary and there's a competitor's product that works similarly at a lower tier.  The name of this is Glyxambi, if she would like to switch to save money please let me know.

## 2015-05-22 DIAGNOSIS — M109 Gout, unspecified: Secondary | ICD-10-CM | POA: Diagnosis not present

## 2015-05-22 DIAGNOSIS — I129 Hypertensive chronic kidney disease with stage 1 through stage 4 chronic kidney disease, or unspecified chronic kidney disease: Secondary | ICD-10-CM | POA: Diagnosis not present

## 2015-05-22 DIAGNOSIS — E1122 Type 2 diabetes mellitus with diabetic chronic kidney disease: Secondary | ICD-10-CM | POA: Diagnosis not present

## 2015-05-22 DIAGNOSIS — N189 Chronic kidney disease, unspecified: Secondary | ICD-10-CM | POA: Diagnosis not present

## 2015-05-22 DIAGNOSIS — M5431 Sciatica, right side: Secondary | ICD-10-CM | POA: Diagnosis not present

## 2015-05-22 DIAGNOSIS — M4800 Spinal stenosis, site unspecified: Secondary | ICD-10-CM | POA: Diagnosis not present

## 2015-05-23 ENCOUNTER — Encounter: Payer: Self-pay | Admitting: *Deleted

## 2015-05-23 DIAGNOSIS — M4800 Spinal stenosis, site unspecified: Secondary | ICD-10-CM | POA: Diagnosis not present

## 2015-05-23 DIAGNOSIS — N189 Chronic kidney disease, unspecified: Secondary | ICD-10-CM | POA: Diagnosis not present

## 2015-05-23 DIAGNOSIS — E1122 Type 2 diabetes mellitus with diabetic chronic kidney disease: Secondary | ICD-10-CM | POA: Diagnosis not present

## 2015-05-23 DIAGNOSIS — I129 Hypertensive chronic kidney disease with stage 1 through stage 4 chronic kidney disease, or unspecified chronic kidney disease: Secondary | ICD-10-CM | POA: Diagnosis not present

## 2015-05-23 DIAGNOSIS — M5431 Sciatica, right side: Secondary | ICD-10-CM | POA: Diagnosis not present

## 2015-05-23 DIAGNOSIS — M109 Gout, unspecified: Secondary | ICD-10-CM | POA: Diagnosis not present

## 2015-05-24 DIAGNOSIS — M109 Gout, unspecified: Secondary | ICD-10-CM | POA: Diagnosis not present

## 2015-05-24 DIAGNOSIS — M5431 Sciatica, right side: Secondary | ICD-10-CM | POA: Diagnosis not present

## 2015-05-24 DIAGNOSIS — E1122 Type 2 diabetes mellitus with diabetic chronic kidney disease: Secondary | ICD-10-CM | POA: Diagnosis not present

## 2015-05-24 DIAGNOSIS — I129 Hypertensive chronic kidney disease with stage 1 through stage 4 chronic kidney disease, or unspecified chronic kidney disease: Secondary | ICD-10-CM | POA: Diagnosis not present

## 2015-05-25 DIAGNOSIS — E1122 Type 2 diabetes mellitus with diabetic chronic kidney disease: Secondary | ICD-10-CM | POA: Diagnosis not present

## 2015-05-25 DIAGNOSIS — I129 Hypertensive chronic kidney disease with stage 1 through stage 4 chronic kidney disease, or unspecified chronic kidney disease: Secondary | ICD-10-CM | POA: Diagnosis not present

## 2015-05-25 DIAGNOSIS — M4800 Spinal stenosis, site unspecified: Secondary | ICD-10-CM | POA: Diagnosis not present

## 2015-05-25 DIAGNOSIS — M109 Gout, unspecified: Secondary | ICD-10-CM | POA: Diagnosis not present

## 2015-05-25 DIAGNOSIS — M5431 Sciatica, right side: Secondary | ICD-10-CM | POA: Diagnosis not present

## 2015-05-25 DIAGNOSIS — N189 Chronic kidney disease, unspecified: Secondary | ICD-10-CM | POA: Diagnosis not present

## 2015-05-31 DIAGNOSIS — E1122 Type 2 diabetes mellitus with diabetic chronic kidney disease: Secondary | ICD-10-CM | POA: Diagnosis not present

## 2015-05-31 DIAGNOSIS — M4800 Spinal stenosis, site unspecified: Secondary | ICD-10-CM | POA: Diagnosis not present

## 2015-05-31 DIAGNOSIS — M109 Gout, unspecified: Secondary | ICD-10-CM | POA: Diagnosis not present

## 2015-05-31 DIAGNOSIS — N189 Chronic kidney disease, unspecified: Secondary | ICD-10-CM | POA: Diagnosis not present

## 2015-05-31 DIAGNOSIS — I129 Hypertensive chronic kidney disease with stage 1 through stage 4 chronic kidney disease, or unspecified chronic kidney disease: Secondary | ICD-10-CM | POA: Diagnosis not present

## 2015-05-31 DIAGNOSIS — M5431 Sciatica, right side: Secondary | ICD-10-CM | POA: Diagnosis not present

## 2015-06-11 ENCOUNTER — Encounter: Payer: Self-pay | Admitting: Family Medicine

## 2015-06-11 ENCOUNTER — Ambulatory Visit (INDEPENDENT_AMBULATORY_CARE_PROVIDER_SITE_OTHER): Payer: Self-pay | Admitting: Family Medicine

## 2015-06-11 ENCOUNTER — Ambulatory Visit: Payer: PRIVATE HEALTH INSURANCE | Admitting: Family Medicine

## 2015-06-11 DIAGNOSIS — Z0289 Encounter for other administrative examinations: Secondary | ICD-10-CM

## 2015-06-11 NOTE — Progress Notes (Signed)
Patient was a no-show for today's appointment,  Her initial no-show note  Was closed 16 minutes after her appointment time without our office be notified about her  Plan to not attend today's visit. I'm documenting this  Because her  scheduledoffice visit disappeared sometime around 20 minutes after her scheduled office visit.

## 2015-06-11 NOTE — Progress Notes (Signed)
No show 06/11/2015

## 2015-06-12 DIAGNOSIS — M5431 Sciatica, right side: Secondary | ICD-10-CM | POA: Diagnosis not present

## 2015-06-12 DIAGNOSIS — I129 Hypertensive chronic kidney disease with stage 1 through stage 4 chronic kidney disease, or unspecified chronic kidney disease: Secondary | ICD-10-CM | POA: Diagnosis not present

## 2015-06-12 DIAGNOSIS — N189 Chronic kidney disease, unspecified: Secondary | ICD-10-CM | POA: Diagnosis not present

## 2015-06-12 DIAGNOSIS — M109 Gout, unspecified: Secondary | ICD-10-CM | POA: Diagnosis not present

## 2015-06-12 DIAGNOSIS — M4800 Spinal stenosis, site unspecified: Secondary | ICD-10-CM | POA: Diagnosis not present

## 2015-06-12 DIAGNOSIS — E1122 Type 2 diabetes mellitus with diabetic chronic kidney disease: Secondary | ICD-10-CM | POA: Diagnosis not present

## 2015-06-14 ENCOUNTER — Other Ambulatory Visit: Payer: Self-pay | Admitting: *Deleted

## 2015-06-14 DIAGNOSIS — I4891 Unspecified atrial fibrillation: Secondary | ICD-10-CM

## 2015-06-15 ENCOUNTER — Telehealth: Payer: Self-pay

## 2015-06-15 NOTE — Telephone Encounter (Signed)
[facility] called and reports having trouble reaching patient. They may have to discharge the patient.

## 2015-06-18 NOTE — Telephone Encounter (Signed)
Pt mailbox full.

## 2015-06-18 NOTE — Telephone Encounter (Signed)
Bonnie Clark, Will you please contact patient and let her know that I'm worried about how frequently she has not been able to participate with physical therapy.  Can you please ask her if there is something that's standing in the way with her not being able to participate with this therapy?

## 2015-06-19 ENCOUNTER — Encounter: Payer: Self-pay | Admitting: Family Medicine

## 2015-06-19 ENCOUNTER — Ambulatory Visit (INDEPENDENT_AMBULATORY_CARE_PROVIDER_SITE_OTHER): Payer: Medicare Other | Admitting: Family Medicine

## 2015-06-19 VITALS — BP 98/57 | HR 83

## 2015-06-19 DIAGNOSIS — I10 Essential (primary) hypertension: Secondary | ICD-10-CM | POA: Diagnosis not present

## 2015-06-19 DIAGNOSIS — N189 Chronic kidney disease, unspecified: Secondary | ICD-10-CM | POA: Diagnosis not present

## 2015-06-19 DIAGNOSIS — I8311 Varicose veins of right lower extremity with inflammation: Secondary | ICD-10-CM | POA: Diagnosis not present

## 2015-06-19 DIAGNOSIS — E1122 Type 2 diabetes mellitus with diabetic chronic kidney disease: Secondary | ICD-10-CM | POA: Diagnosis not present

## 2015-06-19 DIAGNOSIS — I872 Venous insufficiency (chronic) (peripheral): Secondary | ICD-10-CM

## 2015-06-19 DIAGNOSIS — I8312 Varicose veins of left lower extremity with inflammation: Secondary | ICD-10-CM

## 2015-06-19 MED ORDER — SAXAGLIPTIN-METFORMIN ER 2.5-1000 MG PO TB24: ORAL_TABLET | ORAL | Status: DC

## 2015-06-19 NOTE — Progress Notes (Signed)
CC: Bonnie Clark is a 74 y.o. female is here for Diabetes   Subjective: HPI:  Follow-up type 2 diabetes: Continues on Amaryl andKombiglyze XR. [employer] will not cover this medication but would cover Glyxambi.  The patient has decided to switch insurance providers and this will not become activated for another 45 days, she has decided to pay for the uncovered price until that switch occurs. She brings in blood sugars while fasting that range from 111-150. Denies polyuria polyphagia polydipsia or vision loss  Essential hypertension: Continues to take metoprolol and lisinopril daily. No outside blood pressures report. No chest pain shortness of breath orthopnea nor peripheral edema  Follow-up venous stasis dermatitis: She is taking as needed Lasix 40 mg daily, she is unable to tolerate anything more aggressive than this due to urinary frequency. She is using dynamic compression stockings most evenings before going to bed and static compression stockings during the day. She's had some sores on the right leg that seem to be slowly improving with the only treatment being Neosporin applications daily.  Review Of Systems Outlined In HPI  Past Medical History  Diagnosis Date  . Urine incontinence   . Arthritis   . Diabetes mellitus   . Hyperlipidemia   . Hypertension   . Thyroid disease   . Obesity 09/11/2011  . Chronic insomnia 09/11/2011  . Spinal stenosis 09/11/2011  . Fibromyalgia 09/11/2011  . Anxiety and depression 09/11/2011  . Sciatica of right side 09/11/2011  . Gout 09/11/2011  . Gait disorder 12/21/2011  . Atrial fibrillation   . Peripheral edema 06/21/2012  . OAB (overactive bladder) 06/21/2012    Past Surgical History  Procedure Laterality Date  . Thyroidectomy, partial      at 74yo  . Shoulder surgery      right - approx 2005  . Replacement total knee bilateral      right approx 1994, left approx 1999   Family History  Problem Relation Age of Onset  . Arthritis Mother   .  Depression Mother   . Arthritis Father   . Stroke Sister     History   Social History  . Marital Status: Married    Spouse Name: N/A  . Number of Children: 3  . Years of Education: N/A   Occupational History  . Not on file.   Social History Main Topics  . Smoking status: Never Smoker   . Smokeless tobacco: Never Used  . Alcohol Use: No  . Drug Use: No  . Sexual Activity: Not on file   Other Topics Concern  . Not on file   Social History Narrative     Objective: BP 98/57 mmHg  Pulse 83  Wt   General: Alert and Oriented, No Acute Distress HEENT: Pupils equal, round, reactive to light. Conjunctivae clear.  Moist mucousmembranes Lungs: Clear to auscultation bilaterally, no wheezing/ronchi/rales.  Comfortable work of breathing. Good air movement. Cardiac: Regular rate and rhythm. Normal S1/S2.  No murmurs, rubs, nor gallops.   Abdomen: obese Extremities: 1+ pitting edema bilaterally from the midfoot to the shins, shallow ulcerations about the size of a dime sitting next to each other on the proximal right shin without any signs of infection..  Strong peripheral pulses.  Mental Status: No depression, anxiety, nor agitation. Skin: Warm and dry.  Assessment & Plan: Bonnie Clark was seen today for diabetes.  Diagnoses and all orders for this visit:  Type 2 diabetes mellitus with diabetic chronic kidney disease Orders: -  POCT HgB A1C -     Saxagliptin-Metformin (KOMBIGLYZE XR) 2.03-999 MG TB24; Two by mouth at dinner time daily.  Essential hypertension  Venous stasis dermatitis of both lower extremities   Type 2 diabetes: A1c 6.9, controlled continue kombiglyze xr and amaryl Essential hypertension: Controlled continue metoprolol and lisinopril Venous stasis dermatitis: Uncontrolled, start triamcinolone applications daily instead of Neosporin which is unlikely to be of much help other than possibly preventing infection  40 minutes spent face-to-face during visit today  of which at least 50% was counseling or coordinating care regarding: 1. Type 2 diabetes mellitus with diabetic chronic kidney disease   2. Essential hypertension   3. Venous stasis dermatitis of both lower extremities      Return in about 3 months (around 09/19/2015).

## 2015-06-20 DIAGNOSIS — I129 Hypertensive chronic kidney disease with stage 1 through stage 4 chronic kidney disease, or unspecified chronic kidney disease: Secondary | ICD-10-CM | POA: Diagnosis not present

## 2015-06-20 DIAGNOSIS — M4800 Spinal stenosis, site unspecified: Secondary | ICD-10-CM | POA: Diagnosis not present

## 2015-06-20 DIAGNOSIS — E1122 Type 2 diabetes mellitus with diabetic chronic kidney disease: Secondary | ICD-10-CM | POA: Diagnosis not present

## 2015-06-20 DIAGNOSIS — M109 Gout, unspecified: Secondary | ICD-10-CM | POA: Diagnosis not present

## 2015-06-20 DIAGNOSIS — M5431 Sciatica, right side: Secondary | ICD-10-CM | POA: Diagnosis not present

## 2015-06-20 DIAGNOSIS — N189 Chronic kidney disease, unspecified: Secondary | ICD-10-CM | POA: Diagnosis not present

## 2015-06-21 DIAGNOSIS — I129 Hypertensive chronic kidney disease with stage 1 through stage 4 chronic kidney disease, or unspecified chronic kidney disease: Secondary | ICD-10-CM | POA: Diagnosis not present

## 2015-06-21 DIAGNOSIS — M4800 Spinal stenosis, site unspecified: Secondary | ICD-10-CM | POA: Diagnosis not present

## 2015-06-21 DIAGNOSIS — E1122 Type 2 diabetes mellitus with diabetic chronic kidney disease: Secondary | ICD-10-CM | POA: Diagnosis not present

## 2015-06-21 DIAGNOSIS — N189 Chronic kidney disease, unspecified: Secondary | ICD-10-CM | POA: Diagnosis not present

## 2015-06-21 DIAGNOSIS — M5431 Sciatica, right side: Secondary | ICD-10-CM | POA: Diagnosis not present

## 2015-06-21 DIAGNOSIS — M109 Gout, unspecified: Secondary | ICD-10-CM | POA: Diagnosis not present

## 2015-06-22 NOTE — Telephone Encounter (Signed)
PT from [facility] called and states she was able to get in touch with pt and the did do PT today.Jola Babinski) would like an verbal order to to continue PT 2x a week for four weeks. Left message on PT voicemail [phone]

## 2015-06-26 ENCOUNTER — Other Ambulatory Visit: Payer: Self-pay | Admitting: Family Medicine

## 2015-06-26 ENCOUNTER — Telehealth: Payer: Self-pay

## 2015-06-26 NOTE — Telephone Encounter (Signed)
Appeal sent to [facility] for Tier Exception of Pradaxa.

## 2015-06-27 ENCOUNTER — Telehealth: Payer: Self-pay | Admitting: Cardiology

## 2015-06-27 NOTE — Telephone Encounter (Signed)
New message      Prodaxa has been approved for tier exception

## 2015-06-28 DIAGNOSIS — M109 Gout, unspecified: Secondary | ICD-10-CM | POA: Diagnosis not present

## 2015-06-28 DIAGNOSIS — M4800 Spinal stenosis, site unspecified: Secondary | ICD-10-CM | POA: Diagnosis not present

## 2015-06-28 DIAGNOSIS — M5431 Sciatica, right side: Secondary | ICD-10-CM | POA: Diagnosis not present

## 2015-06-28 DIAGNOSIS — I129 Hypertensive chronic kidney disease with stage 1 through stage 4 chronic kidney disease, or unspecified chronic kidney disease: Secondary | ICD-10-CM | POA: Diagnosis not present

## 2015-06-28 DIAGNOSIS — N189 Chronic kidney disease, unspecified: Secondary | ICD-10-CM | POA: Diagnosis not present

## 2015-06-28 DIAGNOSIS — E1122 Type 2 diabetes mellitus with diabetic chronic kidney disease: Secondary | ICD-10-CM | POA: Diagnosis not present

## 2015-06-29 ENCOUNTER — Ambulatory Visit (INDEPENDENT_AMBULATORY_CARE_PROVIDER_SITE_OTHER): Payer: Medicare Other | Admitting: Sports Medicine

## 2015-06-29 ENCOUNTER — Encounter: Payer: Self-pay | Admitting: Sports Medicine

## 2015-06-29 VITALS — BP 145/82 | HR 68 | Ht 67.0 in

## 2015-06-29 DIAGNOSIS — M7022 Olecranon bursitis, left elbow: Secondary | ICD-10-CM | POA: Diagnosis not present

## 2015-06-29 NOTE — Progress Notes (Signed)
  Subjective:    CC: Elbow cyst  HPI: This is a pleasant 74 year old female on Pradaxa for atrial fibrillation referred to me by Dr. Ivan Anchors, for the past year she has had a swelling over her left elbow, moderate, persistent in appearance, only minimal pain, she is wheelchair bound. She would like this treated today. Symptoms are minimal. It does get in the way of activities of daily living.  Past medical history, Surgical history, Family history not pertinant except as noted below, Social history, Allergies, and medications have been entered into the medical record, reviewed, and no changes needed.   Review of Systems: No fevers, chills, night sweats, weight loss, chest pain, or shortness of breath.   Objective:    General: Well Developed, well nourished, and in no acute distress.  Neuro: Alert and oriented x3, extra-ocular muscles intact, sensation grossly intact.  HEENT: Normocephalic, atraumatic, pupils equal round reactive to light, neck supple, no masses, no lymphadenopathy, thyroid nonpalpable.  Skin: Warm and dry, no rashes. Cardiac: Regular rate and rhythm, no murmurs rubs or gallops, no lower extremity edema.  Respiratory: Clear to auscultation bilaterally. Not using accessory muscles, speaking in full sentences. Left elbow: Visible and palpable olecranon bursitis, L was otherwise stable with good strength, range of motion, and ligamentous stability  Procedure: Real-time Ultrasound Guided aspiration/injection of left olecranon bursitis Device: GE Logiq E  Verbal informed consent obtained.  Time-out conducted.  Noted no overlying erythema, induration, or other signs of local infection.  Skin prepped in a sterile fashion.  Local anesthesia: Topical Ethyl chloride.  With sterile technique and under real time ultrasound guidance:  18-gauge needle advanced into the bursa, I was able to only aspirated a small amount of fluid, syringe switched and 1 mL kenalog 40, 1 mL lidocaine  injected easily. Completed without difficulty  Pain immediately resolved suggesting accurate placement of the medication.  Advised to call if fevers/chills, erythema, induration, drainage, or persistent bleeding.  Images permanently stored and available for review in the ultrasound unit.  Impression: Technically successful ultrasound guided injection.  The elbow was then strapped with compressive dressing.  Impression and Recommendations:

## 2015-06-29 NOTE — Assessment & Plan Note (Signed)
Traumatic, hemorrhagic. Likely secondary to Pradaxa. Minimal aspiration, followed by injection. There is a chance that this will simply resorb following the injection. I would like to see her back and if not, completely resolved at that time I will do an incision and drainage type procedure, followed by primary closure.

## 2015-06-29 NOTE — Telephone Encounter (Signed)
Patient notified, Pradaxa approved for Tier reduction from [facility]

## 2015-07-03 DIAGNOSIS — M109 Gout, unspecified: Secondary | ICD-10-CM | POA: Diagnosis not present

## 2015-07-03 DIAGNOSIS — M4800 Spinal stenosis, site unspecified: Secondary | ICD-10-CM | POA: Diagnosis not present

## 2015-07-03 DIAGNOSIS — I129 Hypertensive chronic kidney disease with stage 1 through stage 4 chronic kidney disease, or unspecified chronic kidney disease: Secondary | ICD-10-CM | POA: Diagnosis not present

## 2015-07-03 DIAGNOSIS — E1122 Type 2 diabetes mellitus with diabetic chronic kidney disease: Secondary | ICD-10-CM | POA: Diagnosis not present

## 2015-07-03 DIAGNOSIS — N189 Chronic kidney disease, unspecified: Secondary | ICD-10-CM | POA: Diagnosis not present

## 2015-07-03 DIAGNOSIS — M5431 Sciatica, right side: Secondary | ICD-10-CM | POA: Diagnosis not present

## 2015-07-06 DIAGNOSIS — M109 Gout, unspecified: Secondary | ICD-10-CM | POA: Diagnosis not present

## 2015-07-06 DIAGNOSIS — I129 Hypertensive chronic kidney disease with stage 1 through stage 4 chronic kidney disease, or unspecified chronic kidney disease: Secondary | ICD-10-CM | POA: Diagnosis not present

## 2015-07-06 DIAGNOSIS — E1122 Type 2 diabetes mellitus with diabetic chronic kidney disease: Secondary | ICD-10-CM | POA: Diagnosis not present

## 2015-07-06 DIAGNOSIS — M5431 Sciatica, right side: Secondary | ICD-10-CM | POA: Diagnosis not present

## 2015-07-06 DIAGNOSIS — M4800 Spinal stenosis, site unspecified: Secondary | ICD-10-CM | POA: Diagnosis not present

## 2015-07-06 DIAGNOSIS — N189 Chronic kidney disease, unspecified: Secondary | ICD-10-CM | POA: Diagnosis not present

## 2015-07-10 DIAGNOSIS — M109 Gout, unspecified: Secondary | ICD-10-CM | POA: Diagnosis not present

## 2015-07-10 DIAGNOSIS — N189 Chronic kidney disease, unspecified: Secondary | ICD-10-CM | POA: Diagnosis not present

## 2015-07-10 DIAGNOSIS — I129 Hypertensive chronic kidney disease with stage 1 through stage 4 chronic kidney disease, or unspecified chronic kidney disease: Secondary | ICD-10-CM | POA: Diagnosis not present

## 2015-07-10 DIAGNOSIS — E1122 Type 2 diabetes mellitus with diabetic chronic kidney disease: Secondary | ICD-10-CM | POA: Diagnosis not present

## 2015-07-10 DIAGNOSIS — M5431 Sciatica, right side: Secondary | ICD-10-CM | POA: Diagnosis not present

## 2015-07-10 DIAGNOSIS — M4800 Spinal stenosis, site unspecified: Secondary | ICD-10-CM | POA: Diagnosis not present

## 2015-07-12 DIAGNOSIS — M4800 Spinal stenosis, site unspecified: Secondary | ICD-10-CM | POA: Diagnosis not present

## 2015-07-12 DIAGNOSIS — M109 Gout, unspecified: Secondary | ICD-10-CM | POA: Diagnosis not present

## 2015-07-12 DIAGNOSIS — N189 Chronic kidney disease, unspecified: Secondary | ICD-10-CM | POA: Diagnosis not present

## 2015-07-12 DIAGNOSIS — E1122 Type 2 diabetes mellitus with diabetic chronic kidney disease: Secondary | ICD-10-CM | POA: Diagnosis not present

## 2015-07-12 DIAGNOSIS — M5431 Sciatica, right side: Secondary | ICD-10-CM | POA: Diagnosis not present

## 2015-07-12 DIAGNOSIS — I129 Hypertensive chronic kidney disease with stage 1 through stage 4 chronic kidney disease, or unspecified chronic kidney disease: Secondary | ICD-10-CM | POA: Diagnosis not present

## 2015-07-27 DIAGNOSIS — I4891 Unspecified atrial fibrillation: Secondary | ICD-10-CM | POA: Diagnosis not present

## 2015-07-28 LAB — BASIC METABOLIC PANEL WITH GFR
BUN: 28 mg/dL — ABNORMAL HIGH (ref 7–25)
CO2: 29 mmol/L (ref 20–31)
Calcium: 9.4 mg/dL (ref 8.6–10.4)
Chloride: 100 mmol/L (ref 98–110)
Creat: 1.12 mg/dL — ABNORMAL HIGH (ref 0.60–0.93)
GFR, EST AFRICAN AMERICAN: 56 mL/min — AB (ref >=60)
GFR, EST NON AFRICAN AMERICAN: 49 mL/min — AB (ref >=60)
Glucose, Bld: 148 mg/dL — ABNORMAL HIGH (ref 65–99)
POTASSIUM: 5.3 mmol/L (ref 3.5–5.3)
Sodium: 140 mmol/L (ref 135–146)

## 2015-07-28 LAB — CBC
HCT: 38.2 % (ref 36.0–46.0)
HEMOGLOBIN: 12.5 g/dL (ref 12.0–15.0)
MCH: 31.9 pg (ref 26.0–34.0)
MCHC: 32.7 g/dL (ref 30.0–36.0)
MCV: 97.4 fL (ref 78.0–100.0)
MPV: 11 fL (ref 8.6–12.4)
Platelets: 193 10*3/uL (ref 150–400)
RBC: 3.92 MIL/uL (ref 3.87–5.11)
RDW: 14.8 % (ref 11.5–15.5)
WBC: 6.1 10*3/uL (ref 4.0–10.5)

## 2015-07-29 ENCOUNTER — Other Ambulatory Visit: Payer: Self-pay | Admitting: Family Medicine

## 2015-07-31 ENCOUNTER — Ambulatory Visit: Payer: PRIVATE HEALTH INSURANCE | Admitting: Sports Medicine

## 2015-08-02 ENCOUNTER — Ambulatory Visit (INDEPENDENT_AMBULATORY_CARE_PROVIDER_SITE_OTHER): Payer: Medicare Other | Admitting: Sports Medicine

## 2015-08-02 ENCOUNTER — Encounter: Payer: Self-pay | Admitting: Sports Medicine

## 2015-08-02 VITALS — BP 122/71 | HR 80 | Ht 67.0 in

## 2015-08-02 DIAGNOSIS — M7022 Olecranon bursitis, left elbow: Secondary | ICD-10-CM

## 2015-08-02 NOTE — Progress Notes (Signed)
  Subjective:    CC: Follow-up  HPI: Left traumatic hemorrhagic olecranon bursitis: Doing well after aspiration and injection, had a few further episodes of bleeding, likely due to Pradaxa, overall feeling good. She does have a small collection just distal to her elbow but nontender, no erythema, no constitutional symptoms.  Past medical history, Surgical history, Family history not pertinant except as noted below, Social history, Allergies, and medications have been entered into the medical record, reviewed, and no changes needed.   Review of Systems: No fevers, chills, night sweats, weight loss, chest pain, or shortness of breath.   Objective:    General: Well Developed, well nourished, and in no acute distress.  Neuro: Alert and oriented x3, extra-ocular muscles intact, sensation grossly intact.  HEENT: Normocephalic, atraumatic, pupils equal round reactive to light, neck supple, no masses, no lymphadenopathy, thyroid nonpalpable.  Skin: Warm and dry, no rashes. Cardiac: Regular rate and rhythm, no murmurs rubs or gallops, no lower extremity edema.  Respiratory: Clear to auscultation bilaterally. Not using accessory muscles, speaking in full sentences. Left elbow: Palpable hematoma, nontender, no signs of bacterial superinfection. Applied elbow sleeve,  Impression and Recommendations:

## 2015-08-02 NOTE — Assessment & Plan Note (Signed)
Essentially resolved, there is some residual hematoma after the aspiration and injection, compressed with an elbow sleeve which she will wear to prevent further traumatic hemorrhagic olecranon bursitis.  return as needed.

## 2015-08-12 ENCOUNTER — Other Ambulatory Visit: Payer: Self-pay | Admitting: Cardiology

## 2015-08-24 ENCOUNTER — Other Ambulatory Visit: Payer: Self-pay | Admitting: Cardiology

## 2015-08-26 ENCOUNTER — Other Ambulatory Visit: Payer: Self-pay | Admitting: Family Medicine

## 2015-09-19 ENCOUNTER — Ambulatory Visit (INDEPENDENT_AMBULATORY_CARE_PROVIDER_SITE_OTHER): Payer: Medicare Other | Admitting: Family Medicine

## 2015-09-19 ENCOUNTER — Encounter: Payer: Self-pay | Admitting: Family Medicine

## 2015-09-19 VITALS — BP 117/63 | HR 83

## 2015-09-19 DIAGNOSIS — N183 Chronic kidney disease, stage 3 (moderate): Secondary | ICD-10-CM

## 2015-09-19 DIAGNOSIS — I10 Essential (primary) hypertension: Secondary | ICD-10-CM | POA: Diagnosis not present

## 2015-09-19 DIAGNOSIS — I878 Other specified disorders of veins: Secondary | ICD-10-CM | POA: Diagnosis not present

## 2015-09-19 DIAGNOSIS — E1122 Type 2 diabetes mellitus with diabetic chronic kidney disease: Secondary | ICD-10-CM

## 2015-09-19 LAB — POCT GLYCOSYLATED HEMOGLOBIN (HGB A1C): Hemoglobin A1C: 7.2

## 2015-09-19 MED ORDER — TRIAMCINOLONE ACETONIDE 0.1 % EX CREA: TOPICAL_CREAM | CUTANEOUS | Status: DC

## 2015-09-19 MED ORDER — SAXAGLIPTIN-METFORMIN ER 2.5-1000 MG PO TB24: ORAL_TABLET | ORAL | Status: DC

## 2015-09-19 MED ORDER — AMOXICILLIN 500 MG PO CAPS: ORAL_CAPSULE | ORAL | Status: DC

## 2015-09-19 MED ORDER — GLIMEPIRIDE 4 MG PO TABS: 4.0000 mg | ORAL_TABLET | Freq: Two times a day (BID) | ORAL | Status: DC

## 2015-09-19 NOTE — Progress Notes (Signed)
CC: Bonnie Clark is a 74 y.o. female is here for Diabetes   Subjective: HPI:  Follow-up type 2 diabetes: Currently taking kombiglyze, and Amaryl on a daily basis. Fasting blood sugars are ranging between 130 and 150. Denies polyuria polyphagia or polydipsia. Denies poorly healing wounds or vision loss. She's cut back on sweets in her diet.  Follow essential hypertension: Taking lisinopril and metoprolol on a daily basis. No outside blood pressures to report. No chest pain shortness of breath orthopnea nor peripheral edema.  Follow-up venous stasis: She's been using compression wraps more frequently since I saw her last. She and her husband believe it is helping with preventing venous stasis dermatitis. She tells me to hassle to do this on a daily basis but it works. She denies asymmetric swelling. Swelling is worse when not wearing compression stockings and best when elevated.   Review Of Systems Outlined In HPI  Past Medical History  Diagnosis Date  . Urine incontinence   . Arthritis   . Diabetes mellitus   . Hyperlipidemia   . Hypertension   . Thyroid disease   . Obesity 09/11/2011  . Chronic insomnia 09/11/2011  . Spinal stenosis 09/11/2011  . Fibromyalgia 09/11/2011  . Anxiety and depression 09/11/2011  . Sciatica of right side 09/11/2011  . Gout 09/11/2011  . Gait disorder 12/21/2011  . Atrial fibrillation (HCC)   . Peripheral edema 06/21/2012  . OAB (overactive bladder) 06/21/2012    Past Surgical History  Procedure Laterality Date  . Thyroidectomy, partial      at 74yo  . Shoulder surgery      right - approx 2005  . Replacement total knee bilateral      right approx 1994, left approx 1999   Family History  Problem Relation Age of Onset  . Arthritis Mother   . Depression Mother   . Arthritis Father   . Stroke Sister     Social History   Social History  . Marital Status: Married    Spouse Name: N/A  . Number of Children: 3  . Years of Education: N/A    Occupational History  . Not on file.   Social History Main Topics  . Smoking status: Never Smoker   . Smokeless tobacco: Never Used  . Alcohol Use: No  . Drug Use: No  . Sexual Activity: Not on file   Other Topics Concern  . Not on file   Social History Narrative     Objective: BP 117/63 mmHg  Pulse 83  Vital signs reviewed. General: Alert and Oriented, No Acute Distress HEENT: Pupils equal, round, reactive to light. Conjunctivae clear.  External ears unremarkable.  Moist mucous membranes. Lungs: Clear and comfortable work of breathing, speaking in full sentences without accessory muscle use. Cardiac: Regular rate and rhythm.  Neuro: CN II-XII grossly intact, gait normal. Extremities: No peripheral edema.  Strong peripheral pulses.  Mental Status: No depression, anxiety, nor agitation. Logical though process. Skin: Warm and dry.  Assessment & Plan: Bonnie Clark was seen today for diabetes.  Diagnoses and all orders for this visit:  Type 2 diabetes mellitus with stage 3 chronic kidney disease, without long-term current use of insulin (HCC) -     glimepiride (AMARYL) 4 MG tablet; Take 1 tablet (4 mg total) by mouth 2 (two) times daily with a meal. -     Saxagliptin-Metformin (KOMBIGLYZE XR) 2.03-999 MG TB24; Two by mouth at dinner time daily. -     POCT HgB A1C  Essential hypertension  Venous stasis -     triamcinolone cream (KENALOG) 0.1 %; Apply to affected areas twice a day for up to two weeks, avoid face.  Other orders -     amoxicillin (AMOXIL) 500 MG capsule; Four by mouth one hour prior to procedure.  (Please fill equivalent with 1g tablets if available)   Type 2 diabetes: A1c of 7.2, uncontrolled chronic condition continue kombiglyze increasing Amaryl. Essential hypertension: Controlled continue lisinopril and metoprolol Venous stasis: Dermatitis absent, controlled with compression and as needed triamcinolone cream.  She has a dental appointment for cleaning  with her dentist and has requested a prophylactic dose of amoxicillin due to fears of getting her knee implant infected. I advised her that there stated now that shows she does not need this however she is under the impression that her dentist refuses to do the procedure unless she has this medication.  Return in about 3 months (around 12/20/2015).

## 2015-09-23 ENCOUNTER — Other Ambulatory Visit: Payer: Self-pay | Admitting: Family Medicine

## 2015-10-04 ENCOUNTER — Telehealth: Payer: Self-pay

## 2015-10-04 NOTE — Telephone Encounter (Signed)
Pt husband called and wanting information about a place in [place] that carries the compression wraps that Lainey have been using on her legs.

## 2015-10-05 NOTE — Telephone Encounter (Signed)
Notified. 

## 2015-10-05 NOTE — Telephone Encounter (Signed)
[facility] [address] [place], [place] [address]

## 2015-10-29 ENCOUNTER — Other Ambulatory Visit: Payer: Self-pay | Admitting: Family Medicine

## 2015-11-23 ENCOUNTER — Other Ambulatory Visit: Payer: Self-pay | Admitting: Internal Medicine

## 2015-11-27 ENCOUNTER — Other Ambulatory Visit: Payer: Self-pay

## 2015-11-27 MED ORDER — GLUCOSE BLOOD VI STRP: ORAL_STRIP | Status: DC

## 2015-12-17 ENCOUNTER — Other Ambulatory Visit: Payer: Self-pay | Admitting: Family Medicine

## 2015-12-21 ENCOUNTER — Encounter: Payer: Self-pay | Admitting: Family Medicine

## 2015-12-21 ENCOUNTER — Ambulatory Visit (INDEPENDENT_AMBULATORY_CARE_PROVIDER_SITE_OTHER): Payer: Self-pay | Admitting: Family Medicine

## 2015-12-21 DIAGNOSIS — Z0289 Encounter for other administrative examinations: Secondary | ICD-10-CM

## 2015-12-21 NOTE — Progress Notes (Signed)
No show 12/21/2015

## 2015-12-30 ENCOUNTER — Other Ambulatory Visit: Payer: Self-pay | Admitting: Family Medicine

## 2016-01-01 ENCOUNTER — Telehealth: Payer: Self-pay | Admitting: Cardiology

## 2016-01-01 NOTE — Telephone Encounter (Signed)
It is time for her to schedule her yearly exam due now Routing to   Admin pool to call her and schedule an appointment

## 2016-01-01 NOTE — Telephone Encounter (Signed)
New message      Calling to see when pt is due to see Dr Jens Som again

## 2016-01-07 ENCOUNTER — Ambulatory Visit (INDEPENDENT_AMBULATORY_CARE_PROVIDER_SITE_OTHER): Payer: Medicare Other | Admitting: Family Medicine

## 2016-01-07 ENCOUNTER — Encounter: Payer: Self-pay | Admitting: Family Medicine

## 2016-01-07 VITALS — BP 138/63 | HR 69

## 2016-01-07 DIAGNOSIS — I878 Other specified disorders of veins: Secondary | ICD-10-CM

## 2016-01-07 DIAGNOSIS — N183 Chronic kidney disease, stage 3 (moderate): Secondary | ICD-10-CM

## 2016-01-07 DIAGNOSIS — E1122 Type 2 diabetes mellitus with diabetic chronic kidney disease: Secondary | ICD-10-CM

## 2016-01-07 DIAGNOSIS — R059 Cough, unspecified: Secondary | ICD-10-CM

## 2016-01-07 DIAGNOSIS — R05 Cough: Secondary | ICD-10-CM | POA: Diagnosis not present

## 2016-01-07 LAB — POCT GLYCOSYLATED HEMOGLOBIN (HGB A1C): Hemoglobin A1C: 7

## 2016-01-07 MED ORDER — AMOXICILLIN 500 MG PO CAPS: ORAL_CAPSULE | ORAL | Status: DC

## 2016-01-07 MED ORDER — SAXAGLIPTIN-METFORMIN ER 2.5-1000 MG PO TB24: ORAL_TABLET | ORAL | Status: DC

## 2016-01-07 MED ORDER — GLIMEPIRIDE 4 MG PO TABS: 4.0000 mg | ORAL_TABLET | Freq: Two times a day (BID) | ORAL | Status: DC

## 2016-01-07 MED ORDER — AZITHROMYCIN 250 MG PO TABS: ORAL_TABLET | ORAL | Status: AC

## 2016-01-07 MED ORDER — TRIAMCINOLONE ACETONIDE 0.1 % EX CREA: TOPICAL_CREAM | CUTANEOUS | Status: AC

## 2016-01-07 NOTE — Progress Notes (Signed)
CC: Bonnie Clark is a 75 y.o. female is here for Hyperglycemia and Cough   Subjective: HPI:  Cough on a daily basis for the last 3 weeks. Worse when lying down at night. Occasional wheezing. Productive. Denies chest pain or shortness of breath. No fevers, chills or sore throat.  Follow up type 2 diabetes: She is checking her blood  Sugar at home and it's ranging between 100-130. She denies hypoglycemic episodes. Denies polyphagia or polydipsia or vision loss. It's been over a year since her last diabetic eye exam. Denies vision loss.  Follow-up venous stasis: She is wearing compression stockings on a daily basis. She denies any skin lesions on the legs but does have some red patches that are bothering her. She denies itching or pain but once her something she can do to get rid of these patches. Right now she is using Neosporin and her pharmacist told her to stop using triamcinolone cream since she was not experiencing any itching. Denies fevers, chills or skin lesions.   Review Of Systems Outlined In HPI  Past Medical History  Diagnosis Date  . Urine incontinence   . Arthritis   . Diabetes mellitus   . Hyperlipidemia   . Hypertension   . Thyroid disease   . Obesity 09/11/2011  . Chronic insomnia 09/11/2011  . Spinal stenosis 09/11/2011  . Fibromyalgia 09/11/2011  . Anxiety and depression 09/11/2011  . Sciatica of right side 09/11/2011  . Gout 09/11/2011  . Gait disorder 12/21/2011  . Atrial fibrillation (HCC)   . Peripheral edema 06/21/2012  . OAB (overactive bladder) 06/21/2012    Past Surgical History  Procedure Laterality Date  . Thyroidectomy, partial      at 75yo  . Shoulder surgery      right - approx 2005  . Replacement total knee bilateral      right approx 1994, left approx 1999   Family History  Problem Relation Age of Onset  . Arthritis Mother   . Depression Mother   . Arthritis Father   . Stroke Sister     Social History   Social History  . Marital Status:  Married    Spouse Name: N/A  . Number of Children: 3  . Years of Education: N/A   Occupational History  . Not on file.   Social History Main Topics  . Smoking status: Never Smoker   . Smokeless tobacco: Never Used  . Alcohol Use: No  . Drug Use: No  . Sexual Activity: Not on file   Other Topics Concern  . Not on file   Social History Narrative     Objective: BP 138/63 mmHg  Pulse 69  General: Alert and Oriented, No Acute Distress HEENT: Pupils equal, round, reactive to light. Conjunctivae clear.  Moist mucous membranes pharynx unremarkable Lungs: Clear to auscultation bilaterally, no wheezing/ronchi/rales.  Comfortable work of breathing. Good air movement. Cardiac: Regular rate and rhythm. Normal S1/S2.  No murmurs, rubs, nor gallops.   Abdomen: Normal bowel sounds, soft and non tender without palpable masses. Extremities: 1+ nonpitting edema in the lower extremities from the shins distally. On the shins she also has some mild erythema..  Strong peripheral pulses.  Mental Status: No depression, anxiety, nor agitation. Skin: Warm and dry.  Assessment & Plan: [name] was seen today for hyperglycemia and cough.  Diagnoses and all orders for this visit:  Cough -     azithromycin (ZITHROMAX) 250 MG tablet; Take two tabs at once on day 1, then  one tab daily on days 2-5.  Type 2 diabetes mellitus with stage 3 chronic kidney disease, without long-term current use of insulin (HCC) -     Discontinue: Saxagliptin-Metformin (KOMBIGLYZE XR) 2.03-999 MG TB24; Take 2 tablets by mouth at dinner time daily. -     glimepiride (AMARYL) 4 MG tablet; Take 1 tablet (4 mg total) by mouth 2 (two) times daily with a meal. -     Saxagliptin-Metformin (KOMBIGLYZE XR) 2.03-999 MG TB24; Take 2 tablets by mouth at dinner time daily. -     Ambulatory referral to Ophthalmology -     POCT HgB A1C  Venous stasis -     triamcinolone cream (KENALOG) 0.1 %; Apply to affected areas twice a day for up to  two weeks, avoid face.  Other orders -     amoxicillin (AMOXIL) 500 MG capsule; Four by mouth one hour prior to procedure.  (Please fill equivalent with 1g tablets if available)   Cough: Start azithromycin Type 2 diabetes: Controlled with Amaryl and Kombiblyze XR, A1c of 7.0, continue current antihyperglycemic medication. Referral to ophthalmology has been placed. Venous stasis dermatitis: Mild severity, restart triamcinolone cream at least for 2 weeks.  Her dentist will not do any dental procedures on her and that she has a dose of amoxicillin prior to procedures  40 minutes spent face-to-face during visit today of which at least 50% was counseling or coordinating care regarding: 1. Cough   2. Type 2 diabetes mellitus with stage 3 chronic kidney disease, without long-term current use of insulin (HCC)   3. Venous stasis      Return in about 3 months (around 04/05/2016).

## 2016-01-16 ENCOUNTER — Encounter: Payer: Medicare Other | Admitting: Cardiology

## 2016-01-16 ENCOUNTER — Ambulatory Visit: Payer: Medicare Other | Admitting: Cardiology

## 2016-01-16 NOTE — Progress Notes (Signed)
HPI: FU CHF and atrial fibrillation. Patient had a Myoview on 03/17/2012 for risk stratification. There was no ischemia. However the patient was noted to be in atrial fibrillation which was a new diagnosis. Decision made for rate control and anticoagulation. Echocardiogram repeated in May of 2015. Study extremely limited. Ejection fraction felt at least mildly decreased. There was mild right ventricular enlargement and mild right atrial enlargement. Holter 8/15 showed a fib rate controlled. ABIs 9/15 normal; venous dopplers negative. Since last seen,   Current Outpatient Prescriptions  Medication Sig Dispense Refill  . allopurinol (ZYLOPRIM) 300 MG tablet TAKE ONE TABLET BY MOUTH ONCE DAILY 90 tablet 2  . amoxicillin (AMOXIL) 500 MG capsule Four by mouth one hour prior to procedure.  (Please fill equivalent with 1g tablets if available) 4 capsule 0  . atorvastatin (LIPITOR) 40 MG tablet One by mouth daily. 90 tablet 2  . clonazePAM (KLONOPIN) 1 MG tablet TAKE ONE-HALF TO ONE TABLET BY MOUTH UP TO THREE TIMES DAILY AS NEEDED FOR ANXIETY AND  SLEEP 90 tablet 0  . DULoxetine (CYMBALTA) 60 MG capsule TAKE ONE CAPSULE BY MOUTH ONCE DAILY 90 capsule 1  . furosemide (LASIX) 40 MG tablet Take 1 tablet (40 mg total) by mouth daily. 90 tablet 1  . glimepiride (AMARYL) 4 MG tablet Take 1 tablet (4 mg total) by mouth 2 (two) times daily with a meal. 180 tablet 1  . glucose blood (ONE TOUCH ULTRA TEST) test strip Use to test blood sugar 2 times daily as instructed. Dx code: 250.02 300 each 3  . lisinopril (PRINIVIL,ZESTRIL) 2.5 MG tablet TAKE ONE TABLET BY MOUTH ONCE DAILY 90 tablet 1  . metoprolol (LOPRESSOR) 100 MG tablet TAKE ONE TABLET BY MOUTH TWICE DAILY 180 tablet 0  . PRADAXA 150 MG CAPS capsule TAKE ONE CAPSULE BY MOUTH EVERY 12 HOURS 180 capsule 1  . Saxagliptin-Metformin (KOMBIGLYZE XR) 2.03-999 MG TB24 Take 2 tablets by mouth at dinner time daily. 60 tablet 5  . triamcinolone cream (KENALOG)  0.1 % Apply to affected areas twice a day for up to two weeks, avoid face. 80 g 2   No current facility-administered medications for this visit.     Past Medical History  Diagnosis Date  . Urine incontinence   . Arthritis   . Diabetes mellitus   . Hyperlipidemia   . Hypertension   . Thyroid disease   . Obesity 09/11/2011  . Chronic insomnia 09/11/2011  . Spinal stenosis 09/11/2011  . Fibromyalgia 09/11/2011  . Anxiety and depression 09/11/2011  . Sciatica of right side 09/11/2011  . Gout 09/11/2011  . Gait disorder 12/21/2011  . Atrial fibrillation (HCC)   . Peripheral edema 06/21/2012  . OAB (overactive bladder) 06/21/2012    Past Surgical History  Procedure Laterality Date  . Thyroidectomy, partial      at 75yo  . Shoulder surgery      right - approx 2005  . Replacement total knee bilateral      right approx 1994, left approx 1999    Social History   Social History  . Marital Status: Married    Spouse Name: N/A  . Number of Children: 3  . Years of Education: N/A   Occupational History  . Not on file.   Social History Main Topics  . Smoking status: Never Smoker   . Smokeless tobacco: Never Used  . Alcohol Use: No  . Drug Use: No  . Sexual Activity: Not on file  Other Topics Concern  . Not on file   Social History Narrative    Family History  Problem Relation Age of Onset  . Arthritis Mother   . Depression Mother   . Arthritis Father   . Stroke Sister     ROS: no fevers or chills, productive cough, hemoptysis, dysphasia, odynophagia, melena, hematochezia, dysuria, hematuria, rash, seizure activity, orthopnea, PND, pedal edema, claudication. Remaining systems are negative.  Physical Exam: Well-developed well-nourished in no acute distress.  Skin is warm and dry.  HEENT is normal.  Neck is supple.  Chest is clear to auscultation with normal expansion.  Cardiovascular exam is regular rate and rhythm.  Abdominal exam nontender or distended. No  masses palpated. Extremities show no edema. neuro grossly intact  ECG     This encounter was created in error - please disregard.

## 2016-01-20 ENCOUNTER — Other Ambulatory Visit: Payer: Self-pay | Admitting: Family Medicine

## 2016-01-23 ENCOUNTER — Encounter: Payer: Self-pay | Admitting: Cardiology

## 2016-01-23 ENCOUNTER — Ambulatory Visit (INDEPENDENT_AMBULATORY_CARE_PROVIDER_SITE_OTHER): Payer: Medicare Other | Admitting: Cardiology

## 2016-01-23 VITALS — BP 122/60 | HR 64 | Ht 67.0 in | Wt 350.0 lb

## 2016-01-23 DIAGNOSIS — E669 Obesity, unspecified: Secondary | ICD-10-CM

## 2016-01-23 DIAGNOSIS — I10 Essential (primary) hypertension: Secondary | ICD-10-CM

## 2016-01-23 DIAGNOSIS — E785 Hyperlipidemia, unspecified: Secondary | ICD-10-CM | POA: Diagnosis not present

## 2016-01-23 DIAGNOSIS — I4891 Unspecified atrial fibrillation: Secondary | ICD-10-CM | POA: Diagnosis not present

## 2016-01-23 DIAGNOSIS — I429 Cardiomyopathy, unspecified: Secondary | ICD-10-CM

## 2016-01-23 DIAGNOSIS — R609 Edema, unspecified: Secondary | ICD-10-CM

## 2016-01-23 NOTE — Assessment & Plan Note (Signed)
Patient counseled on weight loss. I recommended again a sleep study/evaluation for obstructive sleep apnea and she declines.

## 2016-01-23 NOTE — Assessment & Plan Note (Signed)
Blood pressure controlled. Continue present medications. Check potassium and renal function. 

## 2016-01-23 NOTE — Progress Notes (Signed)
HPI: FU CHF and atrial fibrillation. Patient had a Myoview on 03/17/2012 for risk stratification. There was no ischemia. However the patient was noted to be in atrial fibrillation which was a new diagnosis. Decision made for rate control and anticoagulation. Echocardiogram repeated in May of 2015. Study extremely limited. Ejection fraction felt at least mildly decreased. There was mild right ventricular enlargement and mild right atrial enlargement. Holter 8/15 showed a fib rate controlled. ABIs 9/15 normal; venous dopplers negative. Since last seen, She had a recent URI with associated cough and dyspnea. She denies chest pain, palpitations, syncope or bleeding. She has chronic pedal edema which is unchanged.  Current Outpatient Prescriptions  Medication Sig Dispense Refill  . allopurinol (ZYLOPRIM) 300 MG tablet TAKE ONE TABLET BY MOUTH ONCE DAILY 90 tablet 2  . amoxicillin (AMOXIL) 500 MG capsule Four by mouth one hour prior to procedure.  (Please fill equivalent with 1g tablets if available) 4 capsule 0  . atorvastatin (LIPITOR) 40 MG tablet One by mouth daily. 90 tablet 2  . clonazePAM (KLONOPIN) 1 MG tablet TAKE ONE-HALF TO ONE TABLET BY MOUTH UP TO THREE TIMES DAILY AS NEEDED FOR ANXIETY AND  SLEEP 90 tablet 0  . DULoxetine (CYMBALTA) 60 MG capsule TAKE ONE CAPSULE BY MOUTH ONCE DAILY 90 capsule 1  . furosemide (LASIX) 40 MG tablet Take 1 tablet (40 mg total) by mouth daily. 90 tablet 1  . glimepiride (AMARYL) 4 MG tablet Take 1 tablet (4 mg total) by mouth 2 (two) times daily with a meal. 180 tablet 1  . glucose blood (ONE TOUCH ULTRA TEST) test strip Use to test blood sugar 2 times daily as instructed. Dx code: 250.02 300 each 3  . lisinopril (PRINIVIL,ZESTRIL) 2.5 MG tablet TAKE ONE TABLET BY MOUTH ONCE DAILY 90 tablet 1  . metoprolol (LOPRESSOR) 100 MG tablet TAKE ONE TABLET BY MOUTH TWICE DAILY 180 tablet 0  . PRADAXA 150 MG CAPS capsule TAKE ONE CAPSULE BY MOUTH EVERY 12 HOURS 180  capsule 1  . Saxagliptin-Metformin (KOMBIGLYZE XR) 2.03-999 MG TB24 Take 2 tablets by mouth at dinner time daily. 60 tablet 5  . triamcinolone cream (KENALOG) 0.1 % Apply to affected areas twice a day for up to two weeks, avoid face. 80 g 2   No current facility-administered medications for this visit.     Past Medical History  Diagnosis Date  . Urine incontinence   . Arthritis   . Diabetes mellitus   . Hyperlipidemia   . Hypertension   . Thyroid disease   . Obesity 09/11/2011  . Chronic insomnia 09/11/2011  . Spinal stenosis 09/11/2011  . Fibromyalgia 09/11/2011  . Anxiety and depression 09/11/2011  . Sciatica of right side 09/11/2011  . Gout 09/11/2011  . Gait disorder 12/21/2011  . Atrial fibrillation (HCC)   . Peripheral edema 06/21/2012  . OAB (overactive bladder) 06/21/2012    Past Surgical History  Procedure Laterality Date  . Thyroidectomy, partial      at 75yo  . Shoulder surgery      right - approx 2005  . Replacement total knee bilateral      right approx 1994, left approx 1999    Social History   Social History  . Marital Status: Married    Spouse Name: N/A  . Number of Children: 3  . Years of Education: N/A   Occupational History  . Not on file.   Social History Main Topics  . Smoking status: Never  Smoker   . Smokeless tobacco: Never Used  . Alcohol Use: No  . Drug Use: No  . Sexual Activity: Not on file   Other Topics Concern  . Not on file   Social History Narrative    Family History  Problem Relation Age of Onset  . Arthritis Mother   . Depression Mother   . Arthritis Father   . Stroke Sister     ROS: no fevers or chills, productive cough, hemoptysis, dysphasia, odynophagia, melena, hematochezia, dysuria, hematuria, rash, seizure activity, orthopnea, PND, pedal edema, claudication. Remaining systems are negative.  Physical Exam: Well-developed morbidly obese in no acute distress.  Skin is warm and dry.  HEENT is normal.  Neck  is supple.  Chest is clear to auscultation with normal expansion.  Cardiovascular exam is irregular Abdominal exam nontender or distended. No masses palpated. Extremities show 1-2+ edema. neuro grossly intact  ECG Atrial fibrillation at a rate of 64. Low voltage.Cannot rule out prior septal infarct. Nonspecific ST changes.

## 2016-01-23 NOTE — Patient Instructions (Signed)

## 2016-01-23 NOTE — Assessment & Plan Note (Signed)
Patient remains in permanent atrial fibrillation. Continue beta blocker for rate control. Continue pradaxa. Check hemoglobin and renal function.

## 2016-01-23 NOTE — Assessment & Plan Note (Signed)
Continue ACE inhibitor and beta blocker. 

## 2016-01-23 NOTE — Assessment & Plan Note (Signed)
Continue statin. 

## 2016-01-23 NOTE — Assessment & Plan Note (Signed)
Continue present dose of Lasix. Check potassium and renal function. She is not interested in increasing her diuretics.

## 2016-01-24 LAB — BASIC METABOLIC PANEL
BUN: 21 mg/dL (ref 7–25)
CALCIUM: 9.3 mg/dL (ref 8.6–10.4)
CHLORIDE: 103 mmol/L (ref 98–110)
CO2: 25 mmol/L (ref 20–31)
CREATININE: 1.05 mg/dL — AB (ref 0.60–0.93)
Glucose, Bld: 108 mg/dL — ABNORMAL HIGH (ref 65–99)
Potassium: 5.3 mmol/L (ref 3.5–5.3)
Sodium: 140 mmol/L (ref 135–146)

## 2016-01-24 LAB — CBC
HEMATOCRIT: 37.4 % (ref 36.0–46.0)
Hemoglobin: 12.5 g/dL (ref 12.0–15.0)
MCH: 32.7 pg (ref 26.0–34.0)
MCHC: 33.4 g/dL (ref 30.0–36.0)
MCV: 97.9 fL (ref 78.0–100.0)
MPV: 10.4 fL (ref 8.6–12.4)
Platelets: 172 10*3/uL (ref 150–400)
RBC: 3.82 MIL/uL — ABNORMAL LOW (ref 3.87–5.11)
RDW: 15.4 % (ref 11.5–15.5)
WBC: 6.5 10*3/uL (ref 4.0–10.5)

## 2016-02-03 ENCOUNTER — Other Ambulatory Visit: Payer: Self-pay | Admitting: Cardiology

## 2016-02-18 DIAGNOSIS — H43813 Vitreous degeneration, bilateral: Secondary | ICD-10-CM | POA: Diagnosis not present

## 2016-02-18 DIAGNOSIS — E119 Type 2 diabetes mellitus without complications: Secondary | ICD-10-CM | POA: Diagnosis not present

## 2016-02-18 DIAGNOSIS — H26493 Other secondary cataract, bilateral: Secondary | ICD-10-CM | POA: Diagnosis not present

## 2016-02-18 DIAGNOSIS — H02831 Dermatochalasis of right upper eyelid: Secondary | ICD-10-CM | POA: Diagnosis not present

## 2016-02-18 DIAGNOSIS — H02834 Dermatochalasis of left upper eyelid: Secondary | ICD-10-CM | POA: Diagnosis not present

## 2016-02-18 DIAGNOSIS — H35371 Puckering of macula, right eye: Secondary | ICD-10-CM | POA: Diagnosis not present

## 2016-02-18 DIAGNOSIS — Z961 Presence of intraocular lens: Secondary | ICD-10-CM | POA: Diagnosis not present

## 2016-02-18 DIAGNOSIS — H527 Unspecified disorder of refraction: Secondary | ICD-10-CM | POA: Diagnosis not present

## 2016-02-18 LAB — HM DIABETES EYE EXAM

## 2016-02-23 ENCOUNTER — Other Ambulatory Visit: Payer: Self-pay | Admitting: Cardiology

## 2016-02-25 NOTE — Telephone Encounter (Signed)
Rx(s) sent to pharmacy electronically.  

## 2016-03-06 ENCOUNTER — Encounter: Payer: Self-pay | Admitting: Family Medicine

## 2016-03-30 ENCOUNTER — Other Ambulatory Visit: Payer: Self-pay | Admitting: Family Medicine

## 2016-04-07 ENCOUNTER — Encounter: Payer: Self-pay | Admitting: Family Medicine

## 2016-04-07 ENCOUNTER — Ambulatory Visit (INDEPENDENT_AMBULATORY_CARE_PROVIDER_SITE_OTHER): Payer: Medicare Other | Admitting: Family Medicine

## 2016-04-07 VITALS — BP 119/72 | HR 83

## 2016-04-07 DIAGNOSIS — I872 Venous insufficiency (chronic) (peripheral): Secondary | ICD-10-CM

## 2016-04-07 DIAGNOSIS — N183 Chronic kidney disease, stage 3 unspecified: Secondary | ICD-10-CM

## 2016-04-07 DIAGNOSIS — I8312 Varicose veins of left lower extremity with inflammation: Secondary | ICD-10-CM | POA: Diagnosis not present

## 2016-04-07 DIAGNOSIS — I8311 Varicose veins of right lower extremity with inflammation: Secondary | ICD-10-CM

## 2016-04-07 DIAGNOSIS — E1122 Type 2 diabetes mellitus with diabetic chronic kidney disease: Secondary | ICD-10-CM

## 2016-04-07 LAB — POCT GLYCOSYLATED HEMOGLOBIN (HGB A1C): Hemoglobin A1C: 7.1

## 2016-04-07 MED ORDER — CLOBETASOL PROPIONATE 0.05 % EX OINT: 1.0000 "application " | TOPICAL_OINTMENT | Freq: Two times a day (BID) | CUTANEOUS | Status: DC

## 2016-04-07 NOTE — Addendum Note (Signed)
Addended by: Thom Chimes on: 04/07/2016 04:23 PM   Modules accepted: Orders

## 2016-04-07 NOTE — Progress Notes (Signed)
CC: Bonnie Clark is a 75 y.o. female is here for Hyperglycemia   Subjective: HPI:  Follow type 2 diabetes: She is taking Amaryl along with kombiglyze xr on a daily basis without percent compliance. blood sugars to report. no polyuria plication polydipsia or poorly healing wounds.  And redness on her shins. It's improved longer she wears compression stockings. She tells me that edema right now is about average for her. She denies any shin pain. She wants something stronger than triamcinolone cream.  Review Of Systems Outlined In HPI  Past Medical History  Diagnosis Date  . Urine incontinence   . Arthritis   . Diabetes mellitus   . Hyperlipidemia   . Hypertension   . Thyroid disease   . Obesity 09/11/2011  . Chronic insomnia 09/11/2011  . Spinal stenosis 09/11/2011  . Fibromyalgia 09/11/2011  . Anxiety and depression 09/11/2011  . Sciatica of right side 09/11/2011  . Gout 09/11/2011  . Gait disorder 12/21/2011  . Atrial fibrillation (HCC)   . Peripheral edema 06/21/2012  . OAB (overactive bladder) 06/21/2012    Past Surgical History  Procedure Laterality Date  . Thyroidectomy, partial      at 75yo  . Shoulder surgery      right - approx 2005  . Replacement total knee bilateral      right approx 1994, left approx 1999   Family History  Problem Relation Age of Onset  . Arthritis Mother   . Depression Mother   . Arthritis Father   . Stroke Sister     Social History   Social History  . Marital Status: Married    Spouse Name: N/A  . Number of Children: 3  . Years of Education: N/A   Occupational History  . Not on file.   Social History Main Topics  . Smoking status: Never Smoker   . Smokeless tobacco: Never Used  . Alcohol Use: No  . Drug Use: No  . Sexual Activity: Not on file   Other Topics Concern  . Not on file   Social History Narrative     Objective: BP 119/72 mmHg  Pulse 83  Wt   General: Alert and Oriented, No Acute Distress HEENT: Pupils  equal, round, reactive to light. Conjunctivae clear.  Moist membranes Lungs: Clear to auscultation bilaterally, no wheezing/ronchi/rales.  Comfortable work of breathing. Good air movement. Cardiac: Regular rate and rhythm. Normal S1/S2.  No murmurs, rubs, nor gallops.   Abdomen obese Extremities: 2+ pitting edema with no open wounds on the shins. Mild erythema on the anterior shins..  Strong peripheral pulses.  Mental Status: No depression, anxiety, nor agitation. Skin: Warm and dry.  Assessment & Plan: Nikolina was seen today for hyperglycemia.  Diagnoses and all orders for this visit:  Type 2 diabetes mellitus with stage 3 chronic kidney disease, without long-term current use of insulin (HCC)  Venous stasis dermatitis of both lower extremities -     clobetasol ointment (TEMOVATE) 0.05 %; Apply 1 application topically 2 (two) times daily.   Type 2 diabetes: A1c of 7.1, controlled, no need to adjust medications right now. Venous stasis dermatitis: Somewhat worse than what her baseline is. I like her to start on clobetasol instead of triamcinolone cream for the next week  25 minutes spent face-to-face during visit today of which at least 50% was counseling or coordinating care regarding: 1. Type 2 diabetes mellitus with stage 3 chronic kidney disease, without long-term current use of insulin (HCC)   2. Venous  stasis dermatitis of both lower extremities      Return in about 3 months (around 07/08/2016) for sugar.

## 2016-04-10 ENCOUNTER — Telehealth: Payer: Self-pay | Admitting: Family Medicine

## 2016-04-10 NOTE — Telephone Encounter (Signed)
Mr dyke said that he spoke to you evoni about a machine his wife uses for her legs.  He wanted to make sure that you got all the information that you needed.  If you didn't for some reason, please give him a call back.  thanks

## 2016-04-11 NOTE — Telephone Encounter (Signed)
I have all the needed information

## 2016-04-14 ENCOUNTER — Other Ambulatory Visit: Payer: Self-pay

## 2016-04-14 MED ORDER — CLONAZEPAM 1 MG PO TABS: ORAL_TABLET | ORAL | Status: DC

## 2016-04-14 NOTE — Telephone Encounter (Signed)
Patient request a new rx for Clonazepam 1 mg. #90 0 refills was sent to [facility]. Brannen Koppen,CMA

## 2016-04-15 ENCOUNTER — Telehealth: Payer: Self-pay | Admitting: *Deleted

## 2016-04-15 NOTE — Telephone Encounter (Signed)
PA initiated and approved for clonazepam  Approvedtoday  ZXYOFV:[id number];GKKDPTE Name:ST: High Risk Medications - Benzodiazepines(Valium, Klonopin,Onfi,Ativan, Restoril,clorazepate dipotassium,Tranxene T-TAB,oxazepam,temazepam,clonazepam,diazepam, Diazepam Intensol,Diastat, lorazepam, Lorazepam Intensol, alprazolam)-MEDICARE-ESI(PA type 2);Status:Approved;Coverage Start Date:03/16/2016;Coverage End Date:04/15/2017;  Patients' spouse notified

## 2016-04-21 ENCOUNTER — Other Ambulatory Visit: Payer: Self-pay | Admitting: Family Medicine

## 2016-06-24 ENCOUNTER — Other Ambulatory Visit: Payer: Self-pay | Admitting: Family Medicine

## 2016-06-24 ENCOUNTER — Other Ambulatory Visit: Payer: Self-pay | Admitting: Cardiology

## 2016-07-08 ENCOUNTER — Ambulatory Visit (INDEPENDENT_AMBULATORY_CARE_PROVIDER_SITE_OTHER): Payer: Medicare Other | Admitting: Family Medicine

## 2016-07-08 ENCOUNTER — Encounter: Payer: Self-pay | Admitting: Family Medicine

## 2016-07-08 VITALS — BP 146/83 | HR 64

## 2016-07-08 DIAGNOSIS — I10 Essential (primary) hypertension: Secondary | ICD-10-CM | POA: Diagnosis not present

## 2016-07-08 DIAGNOSIS — I8311 Varicose veins of right lower extremity with inflammation: Secondary | ICD-10-CM | POA: Diagnosis not present

## 2016-07-08 DIAGNOSIS — I8312 Varicose veins of left lower extremity with inflammation: Secondary | ICD-10-CM

## 2016-07-08 DIAGNOSIS — N183 Chronic kidney disease, stage 3 (moderate): Secondary | ICD-10-CM | POA: Diagnosis not present

## 2016-07-08 DIAGNOSIS — Z9119 Patient's noncompliance with other medical treatment and regimen: Secondary | ICD-10-CM

## 2016-07-08 DIAGNOSIS — I872 Venous insufficiency (chronic) (peripheral): Secondary | ICD-10-CM

## 2016-07-08 DIAGNOSIS — E1122 Type 2 diabetes mellitus with diabetic chronic kidney disease: Secondary | ICD-10-CM

## 2016-07-08 DIAGNOSIS — Z98818 Other dental procedure status: Secondary | ICD-10-CM | POA: Diagnosis not present

## 2016-07-08 DIAGNOSIS — Z91199 Patient's noncompliance with other medical treatment and regimen due to unspecified reason: Secondary | ICD-10-CM

## 2016-07-08 LAB — POCT GLYCOSYLATED HEMOGLOBIN (HGB A1C): HEMOGLOBIN A1C: 7

## 2016-07-08 MED ORDER — SAXAGLIPTIN-METFORMIN ER 2.5-1000 MG PO TB24: ORAL_TABLET | ORAL | 0 refills | Status: DC

## 2016-07-08 MED ORDER — DULOXETINE HCL 60 MG PO CPEP: 60.0000 mg | ORAL_CAPSULE | Freq: Every day | ORAL | 0 refills | Status: DC

## 2016-07-08 MED ORDER — ATORVASTATIN CALCIUM 40 MG PO TABS: 40.0000 mg | ORAL_TABLET | Freq: Every day | ORAL | 0 refills | Status: DC

## 2016-07-08 MED ORDER — METOPROLOL TARTRATE 100 MG PO TABS: 100.0000 mg | ORAL_TABLET | Freq: Two times a day (BID) | ORAL | 0 refills | Status: DC

## 2016-07-08 MED ORDER — ALLOPURINOL 300 MG PO TABS: 300.0000 mg | ORAL_TABLET | Freq: Every day | ORAL | 0 refills | Status: DC

## 2016-07-08 MED ORDER — AMOXICILLIN 500 MG PO CAPS: ORAL_CAPSULE | ORAL | 0 refills | Status: DC

## 2016-07-08 MED ORDER — GLIMEPIRIDE 4 MG PO TABS: 4.0000 mg | ORAL_TABLET | Freq: Two times a day (BID) | ORAL | 0 refills | Status: DC

## 2016-07-08 NOTE — Progress Notes (Signed)
CC: Bonnie Clark is a 75 y.o. female is here for Hyperglycemia   Subjective: HPI:  Follow-up type 2 diabetes: She is taking Amaryl and Kombiglyze XR with 100% compliance, no outside blood sugars to report. She denies vision disturbance. She has had some polyuria however she should reduce this to Lasix use. Denies any known side effects from current medication regimen  She decided that she does not want to use her compression device on a daily basis due to a bloating sensation while using this. It's uncertain how long she's been only doing it every other day however earlier this week she noticed some blisters forming on her shins. She believes that her lower extremity edema is somewhat worsened and symmetrical, equal bilaterally. There are no skin changes elsewhere. Interventions have included using triple antibiotic ointment on the shins. There has been no fevers, chills or breaks in the skin elsewhere.  Follow-up essential hypertension: She continues to take lisinopril and metoprolol on a daily basis. No outside blood pressure report. There is been no chest pain but she does get some shortness of breath when using her compression machine.  She would like a prescription of amoxicillin to take before upcoming dental procedure   Review Of Systems Outlined In HPI  Past Medical History:  Diagnosis Date  . Anxiety and depression 09/11/2011  . Arthritis   . Atrial fibrillation (HCC)   . Chronic insomnia 09/11/2011  . Diabetes mellitus   . Fibromyalgia 09/11/2011  . Gait disorder 12/21/2011  . Gout 09/11/2011  . Hyperlipidemia   . Hypertension   . OAB (overactive bladder) 06/21/2012  . Obesity 09/11/2011  . Peripheral edema 06/21/2012  . Sciatica of right side 09/11/2011  . Spinal stenosis 09/11/2011  . Thyroid disease   . Urine incontinence     Past Surgical History:  Procedure Laterality Date  . REPLACEMENT TOTAL KNEE BILATERAL     right approx 1994, left approx 1999  . SHOULDER SURGERY      right - approx 2005  . THYROIDECTOMY, PARTIAL     at 75yo   Family History  Problem Relation Age of Onset  . Arthritis Mother   . Depression Mother   . Arthritis Father   . Stroke Sister     Social History   Social History  . Marital status: Married    Spouse name: N/A  . Number of children: 3  . Years of education: N/A   Occupational History  . Not on file.   Social History Main Topics  . Smoking status: Never Smoker  . Smokeless tobacco: Never Used  . Alcohol use No  . Drug use: No  . Sexual activity: Not on file   Other Topics Concern  . Not on file   Social History Narrative  . No narrative on file     Objective: BP (!) 146/83   Pulse 64   Vital signs reviewed. General: Alert and Oriented, No Acute Distress HEENT: Pupils equal, round, reactive to light. Conjunctivae clear.  External ears unremarkable.  Moist mucous membranes. Lungs: Clear and comfortable work of breathing, speaking in full sentences without accessory muscle use. Cardiac: Regular rate and rhythm.  Neuro: CN II-XII grossly intact, gait normal. Extremities: 2+ pitting edema from the shins distally.  Strong peripheral pulses.  Mental Status: No depression, anxiety, nor agitation. Logical though process. Skin: Warm and dry. Mild to modest venous stasis dermatitis on both shins without signs of infection  Assessment & Plan: Okey RegalCarol was seen today for  hyperglycemia.  Diagnoses and all orders for this visit:  Type 2 diabetes mellitus with stage 3 chronic kidney disease, without long-term current use of insulin (HCC) -     POCT HgB A1C -     glimepiride (AMARYL) 4 MG tablet; Take 1 tablet (4 mg total) by mouth 2 (two) times daily with a meal. -     Saxagliptin-Metformin (KOMBIGLYZE XR) 2.03-999 MG TB24; Take 2 tablets by mouth at dinner time daily.  Venous stasis dermatitis of both lower extremities  Essential hypertension  Other dental procedure status  Noncompliance  Other orders -      amoxicillin (AMOXIL) 500 MG capsule; Four by mouth one hour prior to procedure.  (Please fill equivalent with 1g tablets if available) -     allopurinol (ZYLOPRIM) 300 MG tablet; Take 1 tablet (300 mg total) by mouth daily. -     atorvastatin (LIPITOR) 40 MG tablet; Take 1 tablet (40 mg total) by mouth daily. -     DULoxetine (CYMBALTA) 60 MG capsule; Take 1 capsule (60 mg total) by mouth daily. -     metoprolol (LOPRESSOR) 100 MG tablet; Take 1 tablet (100 mg total) by mouth 2 (two) times daily.  Type 2 diabetes: A1c of 7.0, controlled, continue Amaryl and Kombiglyze XR Venous stasis dermatitis: She was wrapped in an [employer] here today. I recommended that we get home health involved to help change this every 3-4 days. She is adamant about not having home health involved with this and would like her husband to take care of this. I pointed out that he clearly is left with this burden. In past visits with him I think he started to get burnt out with having to care for multiple medical needs of the patient. Again I encouraged home health however she declines.  Encouraged her to return for nurse visit on Friday to have this replaced. I also encouraged her to use her compression device at least for 1-2 hours every day to help prevent swelling and skin breakdown involving the lower extremities. She tells me this is not possible and is unwilling to go up on her diuretic, the latter of these two is understandable. Since I'm leaving soon she would like a refill of all of her medications. I recommended to providers here that she can see however since they're females she says she is not interested.  Unfortunately noncompliance continues to be a struggle.  40 minutes spent face-to-face during visit today of which at least 50% was counseling or coordinating care regarding: 1. Type 2 diabetes mellitus with stage 3 chronic kidney disease, without long-term current use of insulin (HCC)   2. Venous stasis  dermatitis of both lower extremities   3. Essential hypertension   4. Other dental procedure status   5. Noncompliance        Return in about 3 months (around 10/08/2016).

## 2016-07-11 ENCOUNTER — Ambulatory Visit: Payer: Medicare Other

## 2016-07-13 ENCOUNTER — Other Ambulatory Visit: Payer: Self-pay | Admitting: Family Medicine

## 2016-07-21 ENCOUNTER — Telehealth: Payer: Self-pay | Admitting: Family Medicine

## 2016-07-21 ENCOUNTER — Telehealth: Payer: Self-pay

## 2016-07-21 DIAGNOSIS — I872 Venous insufficiency (chronic) (peripheral): Secondary | ICD-10-CM

## 2016-07-21 MED ORDER — "XEROFORM PETROLAT PATCH 4""X4"" EX PADS": MEDICATED_PAD | CUTANEOUS | 1 refills | Status: AC

## 2016-07-21 NOTE — Telephone Encounter (Signed)
Husband agreed to bring his wife in for a nurse visit for [place] to check her legs to see a unna boot is needed.

## 2016-07-21 NOTE — Telephone Encounter (Signed)
Yes, I'll print off a Rx for the same patches we stock in our office.

## 2016-07-21 NOTE — Telephone Encounter (Signed)
Patient refused home health at last visit, she'll need a nurse visit if another unna boot is desired.

## 2016-07-21 NOTE — Telephone Encounter (Signed)
Pt's husband called to see about an appt for a new unna boot. Looked over last OV, unsure if Pt should come in or if a referral for home health was to be placed. Will route to PCP for review.

## 2016-07-22 ENCOUNTER — Ambulatory Visit (INDEPENDENT_AMBULATORY_CARE_PROVIDER_SITE_OTHER): Payer: Medicare Other | Admitting: Family Medicine

## 2016-07-22 VITALS — BP 127/80 | HR 90 | Temp 97.4°F

## 2016-07-22 DIAGNOSIS — I872 Venous insufficiency (chronic) (peripheral): Secondary | ICD-10-CM

## 2016-07-22 DIAGNOSIS — I8311 Varicose veins of right lower extremity with inflammation: Secondary | ICD-10-CM

## 2016-07-22 DIAGNOSIS — I8312 Varicose veins of left lower extremity with inflammation: Secondary | ICD-10-CM | POA: Diagnosis not present

## 2016-07-22 NOTE — Progress Notes (Signed)
Patient came into clinic today for bilateral lower leg unna boot application. Pt has some blisters and cellulitis on both lower extremities. New unna boot applied, Pt tolerated application well with no immediate complications. Pt advised per PCP to leave unna boot on for 5 days, then it will be monitored by home health. Referral placed. Pt verbalized understanding, no further questions/concerns at this time.

## 2016-07-24 DIAGNOSIS — M1990 Unspecified osteoarthritis, unspecified site: Secondary | ICD-10-CM | POA: Diagnosis not present

## 2016-07-24 DIAGNOSIS — M797 Fibromyalgia: Secondary | ICD-10-CM | POA: Diagnosis not present

## 2016-07-24 DIAGNOSIS — E6609 Other obesity due to excess calories: Secondary | ICD-10-CM | POA: Diagnosis not present

## 2016-07-24 DIAGNOSIS — F419 Anxiety disorder, unspecified: Secondary | ICD-10-CM | POA: Diagnosis not present

## 2016-07-24 DIAGNOSIS — I10 Essential (primary) hypertension: Secondary | ICD-10-CM | POA: Diagnosis not present

## 2016-07-24 DIAGNOSIS — Z7901 Long term (current) use of anticoagulants: Secondary | ICD-10-CM | POA: Diagnosis not present

## 2016-07-24 DIAGNOSIS — F329 Major depressive disorder, single episode, unspecified: Secondary | ICD-10-CM | POA: Diagnosis not present

## 2016-07-24 DIAGNOSIS — G8929 Other chronic pain: Secondary | ICD-10-CM | POA: Diagnosis not present

## 2016-07-24 DIAGNOSIS — E785 Hyperlipidemia, unspecified: Secondary | ICD-10-CM | POA: Diagnosis not present

## 2016-07-24 DIAGNOSIS — E119 Type 2 diabetes mellitus without complications: Secondary | ICD-10-CM | POA: Diagnosis not present

## 2016-07-24 DIAGNOSIS — I482 Chronic atrial fibrillation: Secondary | ICD-10-CM | POA: Diagnosis not present

## 2016-07-24 DIAGNOSIS — Z7984 Long term (current) use of oral hypoglycemic drugs: Secondary | ICD-10-CM | POA: Diagnosis not present

## 2016-07-24 DIAGNOSIS — I872 Venous insufficiency (chronic) (peripheral): Secondary | ICD-10-CM | POA: Diagnosis not present

## 2016-07-25 DIAGNOSIS — H6123 Impacted cerumen, bilateral: Secondary | ICD-10-CM | POA: Diagnosis not present

## 2016-07-25 DIAGNOSIS — B369 Superficial mycosis, unspecified: Secondary | ICD-10-CM | POA: Diagnosis not present

## 2016-07-29 DIAGNOSIS — I872 Venous insufficiency (chronic) (peripheral): Secondary | ICD-10-CM | POA: Diagnosis not present

## 2016-07-29 DIAGNOSIS — F419 Anxiety disorder, unspecified: Secondary | ICD-10-CM | POA: Diagnosis not present

## 2016-07-29 DIAGNOSIS — E785 Hyperlipidemia, unspecified: Secondary | ICD-10-CM | POA: Diagnosis not present

## 2016-07-29 DIAGNOSIS — I10 Essential (primary) hypertension: Secondary | ICD-10-CM | POA: Diagnosis not present

## 2016-07-29 DIAGNOSIS — E119 Type 2 diabetes mellitus without complications: Secondary | ICD-10-CM | POA: Diagnosis not present

## 2016-07-29 DIAGNOSIS — F329 Major depressive disorder, single episode, unspecified: Secondary | ICD-10-CM | POA: Diagnosis not present

## 2016-07-31 NOTE — Progress Notes (Signed)
HPI: FU CHF and atrial fibrillation. Patient had a Myoview on 03/17/2012 for risk stratification. There was no ischemia. However the patient was noted to be in atrial fibrillation which was a new diagnosis. Decision made for rate control and anticoagulation. Echocardiogram repeated in May of 2015. Study extremely limited. Ejection fraction felt at least mildly decreased. There was mild right ventricular enlargement and mild right atrial enlargement. Holter 8/15 showed a fib rate controlled. ABIs 9/15 normal; venous dopplers negative. Since last seen, She denies increasing dyspnea, chest pain, palpitations or syncope. Chronic pedal edema.  Current Outpatient Prescriptions  Medication Sig Dispense Refill  . allopurinol (ZYLOPRIM) 300 MG tablet Take 1 tablet (300 mg total) by mouth daily. 90 tablet 0  . amoxicillin (AMOXIL) 500 MG capsule Four by mouth one hour prior to procedure.  (Please fill equivalent with 1g tablets if available) 4 capsule 0  . atorvastatin (LIPITOR) 40 MG tablet Take 1 tablet (40 mg total) by mouth daily. 90 tablet 0  . Bismuth Tribromoph-Petrolatum (XEROFORM PETROLAT PATCH 4"X4") PADS Apply to irritated skin daily, replace daily as needed. 30 each 1  . clobetasol ointment (TEMOVATE) 0.05 % Apply 1 application topically 2 (two) times daily. 45 g 3  . clonazePAM (KLONOPIN) 1 MG tablet TAKE ONE-HALF TO ONE TABLET BY MOUTH UP TO THREE TIMES DAILY AS NEEDED FOR ANXIETY AND FOR SLEEP 90 tablet 0  . DULoxetine (CYMBALTA) 60 MG capsule Take 1 capsule (60 mg total) by mouth daily. 90 capsule 0  . furosemide (LASIX) 40 MG tablet Take 1 tablet (40 mg total) by mouth daily. 90 tablet 1  . glimepiride (AMARYL) 4 MG tablet Take 1 tablet (4 mg total) by mouth 2 (two) times daily with a meal. 180 tablet 0  . glucose blood (ONE TOUCH ULTRA TEST) test strip Use to test blood sugar 2 times daily as instructed. Dx code: 250.02 300 each 3  . lisinopril (PRINIVIL,ZESTRIL) 2.5 MG tablet TAKE  ONE TABLET BY MOUTH ONCE DAILY 90 tablet 0  . metoprolol (LOPRESSOR) 100 MG tablet Take 1 tablet (100 mg total) by mouth 2 (two) times daily. 180 tablet 0  . PRADAXA 150 MG CAPS capsule TAKE ONE CAPSULE BY MOUTH EVERY 12 HOURS 180 capsule 2  . Saxagliptin-Metformin (KOMBIGLYZE XR) 2.03-999 MG TB24 Take 2 tablets by mouth at dinner time daily. 180 tablet 0  . triamcinolone cream (KENALOG) 0.1 % Apply to affected areas twice a day for up to two weeks, avoid face. 80 g 2   No current facility-administered medications for this visit.      Past Medical History:  Diagnosis Date  . Anxiety and depression 09/11/2011  . Arthritis   . Atrial fibrillation (HCC)   . Chronic insomnia 09/11/2011  . Diabetes mellitus   . Fibromyalgia 09/11/2011  . Gait disorder 12/21/2011  . Gout 09/11/2011  . Hyperlipidemia   . Hypertension   . OAB (overactive bladder) 06/21/2012  . Obesity 09/11/2011  . Peripheral edema 06/21/2012  . Sciatica of right side 09/11/2011  . Spinal stenosis 09/11/2011  . Thyroid disease   . Urine incontinence     Past Surgical History:  Procedure Laterality Date  . REPLACEMENT TOTAL KNEE BILATERAL     right approx 1994, left approx 1999  . SHOULDER SURGERY     right - approx 2005  . THYROIDECTOMY, PARTIAL     at 75yo    Social History   Social History  . Marital status: Married  Spouse name: N/A  . Number of children: 3  . Years of education: N/A   Occupational History  . Not on file.   Social History Main Topics  . Smoking status: Never Smoker  . Smokeless tobacco: Never Used  . Alcohol use No  . Drug use: No  . Sexual activity: Not on file   Other Topics Concern  . Not on file   Social History Narrative  . No narrative on file    Family History  Problem Relation Age of Onset  . Arthritis Mother   . Depression Mother   . Arthritis Father   . Stroke Sister     ROS: no fevers or chills, productive cough, hemoptysis, dysphasia, odynophagia,  melena, hematochezia, dysuria, hematuria, rash, seizure activity, orthopnea, PND, claudication. Remaining systems are negative.  Physical Exam: Well-developed morbidly obese in no acute distress.  Skin is warm and dry.  HEENT is normal.  Neck is supple.  Chest is clear to auscultation with normal expansion.  Cardiovascular exam is irregular Abdominal exam nontender or distended. No masses palpated. Extremities show Chronic skin changes and 2+ ankle edema neuro grossly intact  ECG-Atrial fibrillation at a rate of 89. Nonspecific ST changes. Low voltage. Cannot rule out septal infarct.  A/P  1 Permanent atrial fibrillation-continue beta blocker for rate control. Continue pradaxa. Check Hgb and renal function.   2 cardiomyopathy-continue ACE inhibitor and beta blocker. Repeat echocardiogram.  3 hyperlipidemia-continue statin.  4 hypertension-blood pressure controlled. Continue present medications.  5 morbid obesity-patient counseled on weight loss. She again declines evaluation for sleep apnea.  6 peripheral edema-continue present dose of Lasix. Check potassium and renal function.  Olga MillersBrian Crenshaw, MD

## 2016-08-04 ENCOUNTER — Other Ambulatory Visit: Payer: Self-pay | Admitting: Family Medicine

## 2016-08-05 ENCOUNTER — Other Ambulatory Visit: Payer: Self-pay

## 2016-08-05 ENCOUNTER — Other Ambulatory Visit: Payer: Self-pay | Admitting: Physician Assistant

## 2016-08-05 MED ORDER — HYDROXYZINE HCL 10 MG PO TABS: 10.0000 mg | ORAL_TABLET | Freq: Three times a day (TID) | ORAL | 0 refills | Status: DC | PRN

## 2016-08-05 NOTE — Telephone Encounter (Signed)
Pt notified and would like the vistaril Rx.

## 2016-08-05 NOTE — Telephone Encounter (Signed)
Yes if she is taking clonazepam three times a day she needs to follow up with someone about her anxiety.  We can give vistaril 10mg  up to three times a day for anxiety but no other benzodiapezines and suggest she re-establish with a PCP soon. Confirm she wants the vistaril.

## 2016-08-05 NOTE — Telephone Encounter (Signed)
Rx sent to pharmacy   

## 2016-08-06 ENCOUNTER — Ambulatory Visit (INDEPENDENT_AMBULATORY_CARE_PROVIDER_SITE_OTHER): Payer: Medicare Other | Admitting: Cardiology

## 2016-08-06 ENCOUNTER — Encounter: Payer: Self-pay | Admitting: Cardiology

## 2016-08-06 VITALS — BP 104/69 | HR 89 | Ht 67.0 in

## 2016-08-06 DIAGNOSIS — I10 Essential (primary) hypertension: Secondary | ICD-10-CM | POA: Diagnosis not present

## 2016-08-06 DIAGNOSIS — I482 Chronic atrial fibrillation: Secondary | ICD-10-CM | POA: Diagnosis not present

## 2016-08-06 DIAGNOSIS — I429 Cardiomyopathy, unspecified: Secondary | ICD-10-CM | POA: Diagnosis not present

## 2016-08-06 DIAGNOSIS — R600 Localized edema: Secondary | ICD-10-CM

## 2016-08-06 DIAGNOSIS — R609 Edema, unspecified: Secondary | ICD-10-CM

## 2016-08-06 DIAGNOSIS — E669 Obesity, unspecified: Secondary | ICD-10-CM

## 2016-08-06 DIAGNOSIS — E785 Hyperlipidemia, unspecified: Secondary | ICD-10-CM

## 2016-08-06 DIAGNOSIS — I4821 Permanent atrial fibrillation: Secondary | ICD-10-CM

## 2016-08-06 NOTE — Patient Instructions (Signed)
Medication Instructions:   NO CHANGE  Labwork:  Your physician recommends that you HAVE LAB WORK TODAY  Testing/Procedures:  Your physician has requested that you have an echocardiogram. Echocardiography is a painless test that uses sound waves to create images of your heart. It provides your doctor with information about the size and shape of your heart and how well your heart's chambers and valves are working. This procedure takes approximately one hour. There are no restrictions for this procedure.    Follow-Up:  Your physician wants you to follow-up in: 6 MONTHS WITH DR CRENSHAW You will receive a reminder letter in the mail two months in advance. If you don't receive a letter, please call our office to schedule the follow-up appointment.      

## 2016-08-07 DIAGNOSIS — I872 Venous insufficiency (chronic) (peripheral): Secondary | ICD-10-CM | POA: Diagnosis not present

## 2016-08-07 DIAGNOSIS — F329 Major depressive disorder, single episode, unspecified: Secondary | ICD-10-CM | POA: Diagnosis not present

## 2016-08-07 DIAGNOSIS — I10 Essential (primary) hypertension: Secondary | ICD-10-CM | POA: Diagnosis not present

## 2016-08-07 DIAGNOSIS — E785 Hyperlipidemia, unspecified: Secondary | ICD-10-CM | POA: Diagnosis not present

## 2016-08-07 DIAGNOSIS — F419 Anxiety disorder, unspecified: Secondary | ICD-10-CM | POA: Diagnosis not present

## 2016-08-07 DIAGNOSIS — E119 Type 2 diabetes mellitus without complications: Secondary | ICD-10-CM | POA: Diagnosis not present

## 2016-08-07 LAB — CBC
HCT: 36.5 % (ref 35.0–45.0)
Hemoglobin: 11.9 g/dL (ref 11.7–15.5)
MCH: 32.2 pg (ref 27.0–33.0)
MCHC: 32.6 g/dL (ref 32.0–36.0)
MCV: 98.9 fL (ref 80.0–100.0)
MPV: 11.2 fL (ref 7.5–12.5)
PLATELETS: 163 10*3/uL (ref 140–400)
RBC: 3.69 MIL/uL — ABNORMAL LOW (ref 3.80–5.10)
RDW: 15 % (ref 11.0–15.0)
WBC: 7.9 10*3/uL (ref 3.8–10.8)

## 2016-08-07 LAB — BASIC METABOLIC PANEL
BUN: 19 mg/dL (ref 7–25)
CHLORIDE: 106 mmol/L (ref 98–110)
CO2: 20 mmol/L (ref 20–31)
CREATININE: 1.13 mg/dL — AB (ref 0.60–0.93)
Calcium: 9.1 mg/dL (ref 8.6–10.4)
Glucose, Bld: 127 mg/dL — ABNORMAL HIGH (ref 65–99)
Potassium: 5.1 mmol/L (ref 3.5–5.3)
Sodium: 138 mmol/L (ref 135–146)

## 2016-08-11 ENCOUNTER — Encounter: Payer: Self-pay | Admitting: Physician Assistant

## 2016-08-11 ENCOUNTER — Ambulatory Visit (INDEPENDENT_AMBULATORY_CARE_PROVIDER_SITE_OTHER): Payer: Medicare Other | Admitting: Physician Assistant

## 2016-08-11 VITALS — BP 107/70 | HR 74

## 2016-08-11 DIAGNOSIS — I429 Cardiomyopathy, unspecified: Secondary | ICD-10-CM | POA: Diagnosis not present

## 2016-08-11 DIAGNOSIS — I8312 Varicose veins of left lower extremity with inflammation: Secondary | ICD-10-CM | POA: Diagnosis not present

## 2016-08-11 DIAGNOSIS — I8311 Varicose veins of right lower extremity with inflammation: Secondary | ICD-10-CM | POA: Diagnosis not present

## 2016-08-11 DIAGNOSIS — I872 Venous insufficiency (chronic) (peripheral): Secondary | ICD-10-CM

## 2016-08-12 DIAGNOSIS — E785 Hyperlipidemia, unspecified: Secondary | ICD-10-CM | POA: Diagnosis not present

## 2016-08-12 DIAGNOSIS — E119 Type 2 diabetes mellitus without complications: Secondary | ICD-10-CM | POA: Diagnosis not present

## 2016-08-12 DIAGNOSIS — F329 Major depressive disorder, single episode, unspecified: Secondary | ICD-10-CM | POA: Diagnosis not present

## 2016-08-12 DIAGNOSIS — F419 Anxiety disorder, unspecified: Secondary | ICD-10-CM | POA: Diagnosis not present

## 2016-08-12 DIAGNOSIS — I10 Essential (primary) hypertension: Secondary | ICD-10-CM | POA: Diagnosis not present

## 2016-08-12 DIAGNOSIS — I872 Venous insufficiency (chronic) (peripheral): Secondary | ICD-10-CM | POA: Diagnosis not present

## 2016-08-12 MED ORDER — AMBULATORY NON FORMULARY MEDICATION: 0 refills | Status: DC

## 2016-08-12 NOTE — Progress Notes (Signed)
   Subjective:    Patient ID: Bonnie Clark, female    DOB: [birth year], 75 y.o.   MRN: [id number]  HPI Pt is a 75 yo female who is non-ambulatory. She comes in a power wheelchair. She wishes to establish with myself. She wanted appt to get to know me today.   She has ongoing issue of chronic venous stasis. Unna boots are helping a lot but she only has them once a week. She notices the swelling and leg redness gets worse without the boot. She has home health coming out to the house.    Review of Systems See HPI>     Objective:   Physical Exam  Constitutional: She is oriented to person, place, and time. She appears well-developed and well-nourished.  Morbidly obese.   HENT:  Head: Normocephalic and atraumatic.  Cardiovascular: Normal rate, regular rhythm and normal heart sounds.   Pulmonary/Chest: Effort normal and breath sounds normal.  Neurological: She is alert and oriented to person, place, and time.  Skin:  Bilateral 1+ pitting edema with areas of erythema and crusted scabs on the lateral lower extremities of both legs.   Psychiatric: She has a normal mood and affect. Her behavior is normal.          Assessment & Plan:  Chronic venous stasis of bilateral lower extremity- ordered home health to do unna boot twice a week for 6 months. Recheck in 6 months.

## 2016-08-15 DIAGNOSIS — F419 Anxiety disorder, unspecified: Secondary | ICD-10-CM | POA: Diagnosis not present

## 2016-08-15 DIAGNOSIS — I10 Essential (primary) hypertension: Secondary | ICD-10-CM | POA: Diagnosis not present

## 2016-08-15 DIAGNOSIS — F329 Major depressive disorder, single episode, unspecified: Secondary | ICD-10-CM | POA: Diagnosis not present

## 2016-08-15 DIAGNOSIS — I872 Venous insufficiency (chronic) (peripheral): Secondary | ICD-10-CM | POA: Diagnosis not present

## 2016-08-15 DIAGNOSIS — E785 Hyperlipidemia, unspecified: Secondary | ICD-10-CM | POA: Diagnosis not present

## 2016-08-15 DIAGNOSIS — E119 Type 2 diabetes mellitus without complications: Secondary | ICD-10-CM | POA: Diagnosis not present

## 2016-08-19 ENCOUNTER — Ambulatory Visit (HOSPITAL_COMMUNITY): Payer: Medicare Other | Attending: Cardiology

## 2016-08-19 ENCOUNTER — Other Ambulatory Visit: Payer: Self-pay

## 2016-08-19 DIAGNOSIS — E119 Type 2 diabetes mellitus without complications: Secondary | ICD-10-CM | POA: Insufficient documentation

## 2016-08-19 DIAGNOSIS — E785 Hyperlipidemia, unspecified: Secondary | ICD-10-CM | POA: Diagnosis not present

## 2016-08-19 DIAGNOSIS — I482 Chronic atrial fibrillation: Secondary | ICD-10-CM | POA: Insufficient documentation

## 2016-08-19 DIAGNOSIS — I4821 Permanent atrial fibrillation: Secondary | ICD-10-CM

## 2016-08-19 DIAGNOSIS — I119 Hypertensive heart disease without heart failure: Secondary | ICD-10-CM | POA: Diagnosis not present

## 2016-08-19 DIAGNOSIS — I340 Nonrheumatic mitral (valve) insufficiency: Secondary | ICD-10-CM | POA: Insufficient documentation

## 2016-08-19 DIAGNOSIS — I429 Cardiomyopathy, unspecified: Secondary | ICD-10-CM | POA: Insufficient documentation

## 2016-08-19 DIAGNOSIS — I071 Rheumatic tricuspid insufficiency: Secondary | ICD-10-CM | POA: Insufficient documentation

## 2016-08-19 DIAGNOSIS — I4891 Unspecified atrial fibrillation: Secondary | ICD-10-CM | POA: Diagnosis present

## 2016-08-19 LAB — ECHOCARDIOGRAM COMPLETE

## 2016-08-19 MED ORDER — PERFLUTREN LIPID MICROSPHERE
1.0000 mL | INTRAVENOUS | Status: AC | PRN
  Administered 2016-08-19: 2 mL via INTRAVENOUS

## 2016-08-20 DIAGNOSIS — F329 Major depressive disorder, single episode, unspecified: Secondary | ICD-10-CM | POA: Diagnosis not present

## 2016-08-20 DIAGNOSIS — E785 Hyperlipidemia, unspecified: Secondary | ICD-10-CM | POA: Diagnosis not present

## 2016-08-20 DIAGNOSIS — I872 Venous insufficiency (chronic) (peripheral): Secondary | ICD-10-CM | POA: Diagnosis not present

## 2016-08-20 DIAGNOSIS — I10 Essential (primary) hypertension: Secondary | ICD-10-CM | POA: Diagnosis not present

## 2016-08-20 DIAGNOSIS — F419 Anxiety disorder, unspecified: Secondary | ICD-10-CM | POA: Diagnosis not present

## 2016-08-20 DIAGNOSIS — E119 Type 2 diabetes mellitus without complications: Secondary | ICD-10-CM | POA: Diagnosis not present

## 2016-08-22 DIAGNOSIS — E785 Hyperlipidemia, unspecified: Secondary | ICD-10-CM | POA: Diagnosis not present

## 2016-08-22 DIAGNOSIS — I872 Venous insufficiency (chronic) (peripheral): Secondary | ICD-10-CM | POA: Diagnosis not present

## 2016-08-22 DIAGNOSIS — I10 Essential (primary) hypertension: Secondary | ICD-10-CM | POA: Diagnosis not present

## 2016-08-22 DIAGNOSIS — E119 Type 2 diabetes mellitus without complications: Secondary | ICD-10-CM | POA: Diagnosis not present

## 2016-08-22 DIAGNOSIS — F329 Major depressive disorder, single episode, unspecified: Secondary | ICD-10-CM | POA: Diagnosis not present

## 2016-08-22 DIAGNOSIS — F419 Anxiety disorder, unspecified: Secondary | ICD-10-CM | POA: Diagnosis not present

## 2016-08-26 DIAGNOSIS — F419 Anxiety disorder, unspecified: Secondary | ICD-10-CM | POA: Diagnosis not present

## 2016-08-26 DIAGNOSIS — I10 Essential (primary) hypertension: Secondary | ICD-10-CM | POA: Diagnosis not present

## 2016-08-26 DIAGNOSIS — E785 Hyperlipidemia, unspecified: Secondary | ICD-10-CM | POA: Diagnosis not present

## 2016-08-26 DIAGNOSIS — F329 Major depressive disorder, single episode, unspecified: Secondary | ICD-10-CM | POA: Diagnosis not present

## 2016-08-26 DIAGNOSIS — E119 Type 2 diabetes mellitus without complications: Secondary | ICD-10-CM | POA: Diagnosis not present

## 2016-08-26 DIAGNOSIS — I872 Venous insufficiency (chronic) (peripheral): Secondary | ICD-10-CM | POA: Diagnosis not present

## 2016-08-29 DIAGNOSIS — F329 Major depressive disorder, single episode, unspecified: Secondary | ICD-10-CM | POA: Diagnosis not present

## 2016-08-29 DIAGNOSIS — F419 Anxiety disorder, unspecified: Secondary | ICD-10-CM | POA: Diagnosis not present

## 2016-08-29 DIAGNOSIS — I872 Venous insufficiency (chronic) (peripheral): Secondary | ICD-10-CM | POA: Diagnosis not present

## 2016-08-29 DIAGNOSIS — E119 Type 2 diabetes mellitus without complications: Secondary | ICD-10-CM | POA: Diagnosis not present

## 2016-08-29 DIAGNOSIS — E785 Hyperlipidemia, unspecified: Secondary | ICD-10-CM | POA: Diagnosis not present

## 2016-08-29 DIAGNOSIS — I10 Essential (primary) hypertension: Secondary | ICD-10-CM | POA: Diagnosis not present

## 2016-09-02 DIAGNOSIS — I10 Essential (primary) hypertension: Secondary | ICD-10-CM | POA: Diagnosis not present

## 2016-09-02 DIAGNOSIS — F419 Anxiety disorder, unspecified: Secondary | ICD-10-CM | POA: Diagnosis not present

## 2016-09-02 DIAGNOSIS — E119 Type 2 diabetes mellitus without complications: Secondary | ICD-10-CM | POA: Diagnosis not present

## 2016-09-02 DIAGNOSIS — F329 Major depressive disorder, single episode, unspecified: Secondary | ICD-10-CM | POA: Diagnosis not present

## 2016-09-02 DIAGNOSIS — E785 Hyperlipidemia, unspecified: Secondary | ICD-10-CM | POA: Diagnosis not present

## 2016-09-02 DIAGNOSIS — I872 Venous insufficiency (chronic) (peripheral): Secondary | ICD-10-CM | POA: Diagnosis not present

## 2016-09-05 DIAGNOSIS — I872 Venous insufficiency (chronic) (peripheral): Secondary | ICD-10-CM | POA: Diagnosis not present

## 2016-09-05 DIAGNOSIS — F329 Major depressive disorder, single episode, unspecified: Secondary | ICD-10-CM | POA: Diagnosis not present

## 2016-09-05 DIAGNOSIS — I10 Essential (primary) hypertension: Secondary | ICD-10-CM | POA: Diagnosis not present

## 2016-09-05 DIAGNOSIS — E119 Type 2 diabetes mellitus without complications: Secondary | ICD-10-CM | POA: Diagnosis not present

## 2016-09-05 DIAGNOSIS — E785 Hyperlipidemia, unspecified: Secondary | ICD-10-CM | POA: Diagnosis not present

## 2016-09-05 DIAGNOSIS — F419 Anxiety disorder, unspecified: Secondary | ICD-10-CM | POA: Diagnosis not present

## 2016-09-09 DIAGNOSIS — I10 Essential (primary) hypertension: Secondary | ICD-10-CM | POA: Diagnosis not present

## 2016-09-09 DIAGNOSIS — F419 Anxiety disorder, unspecified: Secondary | ICD-10-CM | POA: Diagnosis not present

## 2016-09-09 DIAGNOSIS — F329 Major depressive disorder, single episode, unspecified: Secondary | ICD-10-CM | POA: Diagnosis not present

## 2016-09-09 DIAGNOSIS — E119 Type 2 diabetes mellitus without complications: Secondary | ICD-10-CM | POA: Diagnosis not present

## 2016-09-09 DIAGNOSIS — E785 Hyperlipidemia, unspecified: Secondary | ICD-10-CM | POA: Diagnosis not present

## 2016-09-09 DIAGNOSIS — I872 Venous insufficiency (chronic) (peripheral): Secondary | ICD-10-CM | POA: Diagnosis not present

## 2016-09-12 DIAGNOSIS — E119 Type 2 diabetes mellitus without complications: Secondary | ICD-10-CM | POA: Diagnosis not present

## 2016-09-12 DIAGNOSIS — F419 Anxiety disorder, unspecified: Secondary | ICD-10-CM | POA: Diagnosis not present

## 2016-09-12 DIAGNOSIS — I872 Venous insufficiency (chronic) (peripheral): Secondary | ICD-10-CM | POA: Diagnosis not present

## 2016-09-12 DIAGNOSIS — F329 Major depressive disorder, single episode, unspecified: Secondary | ICD-10-CM | POA: Diagnosis not present

## 2016-09-12 DIAGNOSIS — I10 Essential (primary) hypertension: Secondary | ICD-10-CM | POA: Diagnosis not present

## 2016-09-12 DIAGNOSIS — E785 Hyperlipidemia, unspecified: Secondary | ICD-10-CM | POA: Diagnosis not present

## 2016-09-13 ENCOUNTER — Other Ambulatory Visit: Payer: Self-pay | Admitting: Cardiology

## 2016-09-16 DIAGNOSIS — I872 Venous insufficiency (chronic) (peripheral): Secondary | ICD-10-CM | POA: Diagnosis not present

## 2016-09-16 DIAGNOSIS — E785 Hyperlipidemia, unspecified: Secondary | ICD-10-CM | POA: Diagnosis not present

## 2016-09-16 DIAGNOSIS — F329 Major depressive disorder, single episode, unspecified: Secondary | ICD-10-CM | POA: Diagnosis not present

## 2016-09-16 DIAGNOSIS — F419 Anxiety disorder, unspecified: Secondary | ICD-10-CM | POA: Diagnosis not present

## 2016-09-16 DIAGNOSIS — E119 Type 2 diabetes mellitus without complications: Secondary | ICD-10-CM | POA: Diagnosis not present

## 2016-09-16 DIAGNOSIS — I10 Essential (primary) hypertension: Secondary | ICD-10-CM | POA: Diagnosis not present

## 2016-09-19 DIAGNOSIS — I872 Venous insufficiency (chronic) (peripheral): Secondary | ICD-10-CM | POA: Diagnosis not present

## 2016-09-19 DIAGNOSIS — E119 Type 2 diabetes mellitus without complications: Secondary | ICD-10-CM | POA: Diagnosis not present

## 2016-09-19 DIAGNOSIS — E785 Hyperlipidemia, unspecified: Secondary | ICD-10-CM | POA: Diagnosis not present

## 2016-09-19 DIAGNOSIS — I10 Essential (primary) hypertension: Secondary | ICD-10-CM | POA: Diagnosis not present

## 2016-09-19 DIAGNOSIS — F329 Major depressive disorder, single episode, unspecified: Secondary | ICD-10-CM | POA: Diagnosis not present

## 2016-09-19 DIAGNOSIS — F419 Anxiety disorder, unspecified: Secondary | ICD-10-CM | POA: Diagnosis not present

## 2016-09-22 DIAGNOSIS — E785 Hyperlipidemia, unspecified: Secondary | ICD-10-CM | POA: Diagnosis not present

## 2016-09-22 DIAGNOSIS — F419 Anxiety disorder, unspecified: Secondary | ICD-10-CM | POA: Diagnosis not present

## 2016-09-22 DIAGNOSIS — I872 Venous insufficiency (chronic) (peripheral): Secondary | ICD-10-CM | POA: Diagnosis not present

## 2016-09-22 DIAGNOSIS — I10 Essential (primary) hypertension: Secondary | ICD-10-CM | POA: Diagnosis not present

## 2016-09-22 DIAGNOSIS — F329 Major depressive disorder, single episode, unspecified: Secondary | ICD-10-CM | POA: Diagnosis not present

## 2016-09-22 DIAGNOSIS — E119 Type 2 diabetes mellitus without complications: Secondary | ICD-10-CM | POA: Diagnosis not present

## 2016-09-23 DIAGNOSIS — E785 Hyperlipidemia, unspecified: Secondary | ICD-10-CM | POA: Diagnosis not present

## 2016-09-23 DIAGNOSIS — E119 Type 2 diabetes mellitus without complications: Secondary | ICD-10-CM | POA: Diagnosis not present

## 2016-09-23 DIAGNOSIS — I872 Venous insufficiency (chronic) (peripheral): Secondary | ICD-10-CM | POA: Diagnosis not present

## 2016-09-23 DIAGNOSIS — F419 Anxiety disorder, unspecified: Secondary | ICD-10-CM | POA: Diagnosis not present

## 2016-09-23 DIAGNOSIS — F329 Major depressive disorder, single episode, unspecified: Secondary | ICD-10-CM | POA: Diagnosis not present

## 2016-09-23 DIAGNOSIS — I10 Essential (primary) hypertension: Secondary | ICD-10-CM | POA: Diagnosis not present

## 2016-09-26 DIAGNOSIS — E119 Type 2 diabetes mellitus without complications: Secondary | ICD-10-CM | POA: Diagnosis not present

## 2016-09-26 DIAGNOSIS — E785 Hyperlipidemia, unspecified: Secondary | ICD-10-CM | POA: Diagnosis not present

## 2016-09-26 DIAGNOSIS — I10 Essential (primary) hypertension: Secondary | ICD-10-CM | POA: Diagnosis not present

## 2016-09-26 DIAGNOSIS — I872 Venous insufficiency (chronic) (peripheral): Secondary | ICD-10-CM | POA: Diagnosis not present

## 2016-09-26 DIAGNOSIS — F329 Major depressive disorder, single episode, unspecified: Secondary | ICD-10-CM | POA: Diagnosis not present

## 2016-09-26 DIAGNOSIS — F419 Anxiety disorder, unspecified: Secondary | ICD-10-CM | POA: Diagnosis not present

## 2016-09-30 DIAGNOSIS — I10 Essential (primary) hypertension: Secondary | ICD-10-CM | POA: Diagnosis not present

## 2016-09-30 DIAGNOSIS — F419 Anxiety disorder, unspecified: Secondary | ICD-10-CM | POA: Diagnosis not present

## 2016-09-30 DIAGNOSIS — I872 Venous insufficiency (chronic) (peripheral): Secondary | ICD-10-CM | POA: Diagnosis not present

## 2016-09-30 DIAGNOSIS — F329 Major depressive disorder, single episode, unspecified: Secondary | ICD-10-CM | POA: Diagnosis not present

## 2016-09-30 DIAGNOSIS — E119 Type 2 diabetes mellitus without complications: Secondary | ICD-10-CM | POA: Diagnosis not present

## 2016-09-30 DIAGNOSIS — E785 Hyperlipidemia, unspecified: Secondary | ICD-10-CM | POA: Diagnosis not present

## 2016-10-03 DIAGNOSIS — F419 Anxiety disorder, unspecified: Secondary | ICD-10-CM | POA: Diagnosis not present

## 2016-10-03 DIAGNOSIS — F329 Major depressive disorder, single episode, unspecified: Secondary | ICD-10-CM | POA: Diagnosis not present

## 2016-10-03 DIAGNOSIS — E785 Hyperlipidemia, unspecified: Secondary | ICD-10-CM | POA: Diagnosis not present

## 2016-10-03 DIAGNOSIS — I872 Venous insufficiency (chronic) (peripheral): Secondary | ICD-10-CM | POA: Diagnosis not present

## 2016-10-03 DIAGNOSIS — I10 Essential (primary) hypertension: Secondary | ICD-10-CM | POA: Diagnosis not present

## 2016-10-03 DIAGNOSIS — E119 Type 2 diabetes mellitus without complications: Secondary | ICD-10-CM | POA: Diagnosis not present

## 2016-10-07 DIAGNOSIS — I10 Essential (primary) hypertension: Secondary | ICD-10-CM | POA: Diagnosis not present

## 2016-10-07 DIAGNOSIS — I872 Venous insufficiency (chronic) (peripheral): Secondary | ICD-10-CM | POA: Diagnosis not present

## 2016-10-07 DIAGNOSIS — E785 Hyperlipidemia, unspecified: Secondary | ICD-10-CM | POA: Diagnosis not present

## 2016-10-07 DIAGNOSIS — E119 Type 2 diabetes mellitus without complications: Secondary | ICD-10-CM | POA: Diagnosis not present

## 2016-10-07 DIAGNOSIS — F419 Anxiety disorder, unspecified: Secondary | ICD-10-CM | POA: Diagnosis not present

## 2016-10-07 DIAGNOSIS — F329 Major depressive disorder, single episode, unspecified: Secondary | ICD-10-CM | POA: Diagnosis not present

## 2016-10-10 DIAGNOSIS — I872 Venous insufficiency (chronic) (peripheral): Secondary | ICD-10-CM | POA: Diagnosis not present

## 2016-10-10 DIAGNOSIS — F419 Anxiety disorder, unspecified: Secondary | ICD-10-CM | POA: Diagnosis not present

## 2016-10-10 DIAGNOSIS — E119 Type 2 diabetes mellitus without complications: Secondary | ICD-10-CM | POA: Diagnosis not present

## 2016-10-10 DIAGNOSIS — I10 Essential (primary) hypertension: Secondary | ICD-10-CM | POA: Diagnosis not present

## 2016-10-10 DIAGNOSIS — E785 Hyperlipidemia, unspecified: Secondary | ICD-10-CM | POA: Diagnosis not present

## 2016-10-10 DIAGNOSIS — F329 Major depressive disorder, single episode, unspecified: Secondary | ICD-10-CM | POA: Diagnosis not present

## 2016-10-14 DIAGNOSIS — E119 Type 2 diabetes mellitus without complications: Secondary | ICD-10-CM | POA: Diagnosis not present

## 2016-10-14 DIAGNOSIS — E785 Hyperlipidemia, unspecified: Secondary | ICD-10-CM | POA: Diagnosis not present

## 2016-10-14 DIAGNOSIS — F329 Major depressive disorder, single episode, unspecified: Secondary | ICD-10-CM | POA: Diagnosis not present

## 2016-10-14 DIAGNOSIS — I10 Essential (primary) hypertension: Secondary | ICD-10-CM | POA: Diagnosis not present

## 2016-10-14 DIAGNOSIS — I872 Venous insufficiency (chronic) (peripheral): Secondary | ICD-10-CM | POA: Diagnosis not present

## 2016-10-14 DIAGNOSIS — F419 Anxiety disorder, unspecified: Secondary | ICD-10-CM | POA: Diagnosis not present

## 2016-10-21 DIAGNOSIS — E785 Hyperlipidemia, unspecified: Secondary | ICD-10-CM | POA: Diagnosis not present

## 2016-10-21 DIAGNOSIS — F329 Major depressive disorder, single episode, unspecified: Secondary | ICD-10-CM | POA: Diagnosis not present

## 2016-10-21 DIAGNOSIS — I872 Venous insufficiency (chronic) (peripheral): Secondary | ICD-10-CM | POA: Diagnosis not present

## 2016-10-21 DIAGNOSIS — F419 Anxiety disorder, unspecified: Secondary | ICD-10-CM | POA: Diagnosis not present

## 2016-10-21 DIAGNOSIS — I10 Essential (primary) hypertension: Secondary | ICD-10-CM | POA: Diagnosis not present

## 2016-10-21 DIAGNOSIS — E119 Type 2 diabetes mellitus without complications: Secondary | ICD-10-CM | POA: Diagnosis not present

## 2016-10-23 ENCOUNTER — Other Ambulatory Visit: Payer: Self-pay

## 2016-10-23 MED ORDER — METOPROLOL TARTRATE 100 MG PO TABS: 100.0000 mg | ORAL_TABLET | Freq: Two times a day (BID) | ORAL | 0 refills | Status: DC

## 2016-10-27 ENCOUNTER — Other Ambulatory Visit: Payer: Self-pay | Admitting: Physician Assistant

## 2016-10-28 ENCOUNTER — Other Ambulatory Visit: Payer: Self-pay

## 2016-10-28 DIAGNOSIS — E119 Type 2 diabetes mellitus without complications: Secondary | ICD-10-CM | POA: Diagnosis not present

## 2016-10-28 DIAGNOSIS — F419 Anxiety disorder, unspecified: Secondary | ICD-10-CM | POA: Diagnosis not present

## 2016-10-28 DIAGNOSIS — E785 Hyperlipidemia, unspecified: Secondary | ICD-10-CM | POA: Diagnosis not present

## 2016-10-28 DIAGNOSIS — I10 Essential (primary) hypertension: Secondary | ICD-10-CM | POA: Diagnosis not present

## 2016-10-28 DIAGNOSIS — F329 Major depressive disorder, single episode, unspecified: Secondary | ICD-10-CM | POA: Diagnosis not present

## 2016-10-28 DIAGNOSIS — I872 Venous insufficiency (chronic) (peripheral): Secondary | ICD-10-CM | POA: Diagnosis not present

## 2016-10-28 MED ORDER — DABIGATRAN ETEXILATE MESYLATE 150 MG PO CAPS: 150.0000 mg | ORAL_CAPSULE | Freq: Two times a day (BID) | ORAL | 3 refills | Status: DC

## 2016-10-28 NOTE — Telephone Encounter (Signed)
Rx(s) sent to pharmacy electronically.  

## 2016-10-29 ENCOUNTER — Encounter: Payer: Self-pay | Admitting: Cardiology

## 2016-10-29 ENCOUNTER — Ambulatory Visit (INDEPENDENT_AMBULATORY_CARE_PROVIDER_SITE_OTHER): Payer: Medicare Other | Admitting: Cardiology

## 2016-10-29 VITALS — BP 117/71 | HR 67 | Ht 67.0 in | Wt 333.0 lb

## 2016-10-29 DIAGNOSIS — I4891 Unspecified atrial fibrillation: Secondary | ICD-10-CM | POA: Diagnosis not present

## 2016-10-29 DIAGNOSIS — G4730 Sleep apnea, unspecified: Secondary | ICD-10-CM

## 2016-10-29 LAB — CBC
HCT: 37.6 % (ref 35.0–45.0)
Hemoglobin: 12.1 g/dL (ref 11.7–15.5)
MCH: 32.1 pg (ref 27.0–33.0)
MCHC: 32.2 g/dL (ref 32.0–36.0)
MCV: 99.7 fL (ref 80.0–100.0)
MPV: 11.2 fL (ref 7.5–12.5)
PLATELETS: 181 10*3/uL (ref 140–400)
RBC: 3.77 MIL/uL — AB (ref 3.80–5.10)
RDW: 15.4 % — AB (ref 11.0–15.0)
WBC: 6.7 10*3/uL (ref 3.8–10.8)

## 2016-10-29 NOTE — Patient Instructions (Signed)

## 2016-10-29 NOTE — Progress Notes (Signed)
HPI: FU CHF and atrial fibrillation. Patient had a Myoview on 03/17/2012 for risk stratification. There was no ischemia. However the patient was noted to be in atrial fibrillation which was a new diagnosis. Decision made for rate control and anticoagulation. Holter 8/15 showed a fib rate controlled. ABIs 9/15 normal; venous dopplers negative. Echocardiogram September 2017 was technically difficult. LV function felt to be normal. Mild left atrial enlargement and mild tricuspid regurgitation. Since last seen, She denies increasing dyspnea, chest pain, palpitations or syncope. Chronic pedal edema unchanged.  Current Outpatient Prescriptions  Medication Sig Dispense Refill  . allopurinol (ZYLOPRIM) 300 MG tablet Take 1 tablet (300 mg total) by mouth daily. 90 tablet 0  . AMBULATORY NON FORMULARY MEDICATION Home health orders for unna boot application twice a week for treatment of chronic venous stasis for next 6 months. Follow up in office every 6 months. 1 Device 0  . amoxicillin (AMOXIL) 500 MG capsule Four by mouth one hour prior to procedure.  (Please fill equivalent with 1g tablets if available) 4 capsule 0  . atorvastatin (LIPITOR) 40 MG tablet Take 1 tablet (40 mg total) by mouth daily. 90 tablet 0  . Bismuth Tribromoph-Petrolatum (XEROFORM PETROLAT PATCH 4"X4") PADS Apply to irritated skin daily, replace daily as needed. 30 each 1  . clobetasol ointment (TEMOVATE) 0.05 % Apply 1 application topically 2 (two) times daily. (Patient taking differently: Apply 1 application topically as directed. ) 45 g 3  . clonazePAM (KLONOPIN) 1 MG tablet TAKE ONE-HALF TO ONE TABLET BY MOUTH UP TO THREE TIMES DAILY AS NEEDED FOR ANXIETY AND FOR SLEEP 90 tablet 0  . dabigatran (PRADAXA) 150 MG CAPS capsule Take 1 capsule (150 mg total) by mouth 2 (two) times daily. 180 capsule 3  . DULoxetine (CYMBALTA) 60 MG capsule Take 1 capsule (60 mg total) by mouth daily. 90 capsule 0  . furosemide (LASIX) 40 MG tablet  Take 1 tablet (40 mg total) by mouth daily. 90 tablet 1  . glimepiride (AMARYL) 4 MG tablet Take 1 tablet (4 mg total) by mouth 2 (two) times daily with a meal. 180 tablet 0  . glucose blood (ONE TOUCH ULTRA TEST) test strip Use to test blood sugar 2 times daily as instructed. Dx code: 250.02 300 each 3  . lisinopril (PRINIVIL,ZESTRIL) 2.5 MG tablet TAKE ONE TABLET BY MOUTH ONCE DAILY 90 tablet 0  . metoprolol (LOPRESSOR) 100 MG tablet Take 1 tablet (100 mg total) by mouth 2 (two) times daily. 180 tablet 0  . Saxagliptin-Metformin (KOMBIGLYZE XR) 2.03-999 MG TB24 Take 2 tablets by mouth at dinner time daily. 180 tablet 0  . triamcinolone cream (KENALOG) 0.1 % Apply to affected areas twice a day for up to two weeks, avoid face. 80 g 2   No current facility-administered medications for this visit.      Past Medical History:  Diagnosis Date  . Anxiety and depression 09/11/2011  . Arthritis   . Atrial fibrillation (HCC)   . Chronic insomnia 09/11/2011  . Diabetes mellitus   . Fibromyalgia 09/11/2011  . Gait disorder 12/21/2011  . Gout 09/11/2011  . Hyperlipidemia   . Hypertension   . OAB (overactive bladder) 06/21/2012  . Obesity 09/11/2011  . Peripheral edema 06/21/2012  . Sciatica of right side 09/11/2011  . Spinal stenosis 09/11/2011  . Thyroid disease   . Urine incontinence     Past Surgical History:  Procedure Laterality Date  . REPLACEMENT TOTAL KNEE BILATERAL  right approx 1994, left approx 1999  . SHOULDER SURGERY     right - approx 2005  . THYROIDECTOMY, PARTIAL     at 75yo    Social History   Social History  . Marital status: Married    Spouse name: N/A  . Number of children: 3  . Years of education: N/A   Occupational History  . Not on file.   Social History Main Topics  . Smoking status: Never Smoker  . Smokeless tobacco: Never Used  . Alcohol use No  . Drug use: No  . Sexual activity: Not on file   Other Topics Concern  . Not on file   Social  History Narrative  . No narrative on file    Family History  Problem Relation Age of Onset  . Arthritis Mother   . Depression Mother   . Arthritis Father   . Stroke Sister     ROS: no fevers or chills, productive cough, hemoptysis, dysphasia, odynophagia, melena, hematochezia, dysuria, hematuria, rash, seizure activity, orthopnea, PND, claudication. Remaining systems are negative.  Physical Exam: Well-developed morbidly obese in no acute distress.  Skin is warm and dry.  HEENT is normal.  Neck is supple.  Chest is clear to auscultation with normal expansion.  Cardiovascular exam is irregular Abdominal exam nontender or distended. No masses palpated. Extremities show 1+ edema. neuro grossly intact  A/P  1 Permanent atrial fibrillation-continue beta blocker for rate control. Continue pradaxa. Check Hgb and renal function.   2 cardiomyopathy-continue ACE inhibitor and beta blocker.   3 hyperlipidemia-continue statin.  4 hypertension-blood pressure controlled. Continue present medications.  5 morbid obesity-patient counseled on weight loss. She is now agreeable to evaluation for possible sleep apnea. Refer to pulmonary.  6 peripheral edema-continue present dose of Lasix. Check potassium and renal function.  Olga MillersBrian Tahjae Durr, MD

## 2016-10-30 LAB — BASIC METABOLIC PANEL
BUN: 18 mg/dL (ref 7–25)
CALCIUM: 8.9 mg/dL (ref 8.6–10.4)
CO2: 26 mmol/L (ref 20–31)
CREATININE: 1.09 mg/dL — AB (ref 0.60–0.93)
Chloride: 105 mmol/L (ref 98–110)
Glucose, Bld: 91 mg/dL (ref 65–99)
Potassium: 4.8 mmol/L (ref 3.5–5.3)
SODIUM: 140 mmol/L (ref 135–146)

## 2016-10-31 ENCOUNTER — Other Ambulatory Visit: Payer: Self-pay | Admitting: *Deleted

## 2016-10-31 DIAGNOSIS — F419 Anxiety disorder, unspecified: Secondary | ICD-10-CM | POA: Diagnosis not present

## 2016-10-31 DIAGNOSIS — E785 Hyperlipidemia, unspecified: Secondary | ICD-10-CM | POA: Diagnosis not present

## 2016-10-31 DIAGNOSIS — I872 Venous insufficiency (chronic) (peripheral): Secondary | ICD-10-CM | POA: Diagnosis not present

## 2016-10-31 DIAGNOSIS — E119 Type 2 diabetes mellitus without complications: Secondary | ICD-10-CM | POA: Diagnosis not present

## 2016-10-31 DIAGNOSIS — F329 Major depressive disorder, single episode, unspecified: Secondary | ICD-10-CM | POA: Diagnosis not present

## 2016-10-31 DIAGNOSIS — I10 Essential (primary) hypertension: Secondary | ICD-10-CM | POA: Diagnosis not present

## 2016-10-31 MED ORDER — CLONAZEPAM 1 MG PO TABS: ORAL_TABLET | ORAL | 0 refills | Status: DC

## 2016-10-31 MED ORDER — DULOXETINE HCL 60 MG PO CPEP: 60.0000 mg | ORAL_CAPSULE | Freq: Every day | ORAL | 0 refills | Status: DC

## 2016-11-03 DIAGNOSIS — I10 Essential (primary) hypertension: Secondary | ICD-10-CM | POA: Diagnosis not present

## 2016-11-03 DIAGNOSIS — E785 Hyperlipidemia, unspecified: Secondary | ICD-10-CM | POA: Diagnosis not present

## 2016-11-03 DIAGNOSIS — I872 Venous insufficiency (chronic) (peripheral): Secondary | ICD-10-CM | POA: Diagnosis not present

## 2016-11-03 DIAGNOSIS — F419 Anxiety disorder, unspecified: Secondary | ICD-10-CM | POA: Diagnosis not present

## 2016-11-03 DIAGNOSIS — F329 Major depressive disorder, single episode, unspecified: Secondary | ICD-10-CM | POA: Diagnosis not present

## 2016-11-03 DIAGNOSIS — E119 Type 2 diabetes mellitus without complications: Secondary | ICD-10-CM | POA: Diagnosis not present

## 2016-11-05 DIAGNOSIS — I10 Essential (primary) hypertension: Secondary | ICD-10-CM | POA: Diagnosis not present

## 2016-11-05 DIAGNOSIS — E119 Type 2 diabetes mellitus without complications: Secondary | ICD-10-CM | POA: Diagnosis not present

## 2016-11-05 DIAGNOSIS — F329 Major depressive disorder, single episode, unspecified: Secondary | ICD-10-CM | POA: Diagnosis not present

## 2016-11-05 DIAGNOSIS — I872 Venous insufficiency (chronic) (peripheral): Secondary | ICD-10-CM | POA: Diagnosis not present

## 2016-11-05 DIAGNOSIS — F419 Anxiety disorder, unspecified: Secondary | ICD-10-CM | POA: Diagnosis not present

## 2016-11-05 DIAGNOSIS — E785 Hyperlipidemia, unspecified: Secondary | ICD-10-CM | POA: Diagnosis not present

## 2016-11-07 DIAGNOSIS — F329 Major depressive disorder, single episode, unspecified: Secondary | ICD-10-CM | POA: Diagnosis not present

## 2016-11-07 DIAGNOSIS — E119 Type 2 diabetes mellitus without complications: Secondary | ICD-10-CM | POA: Diagnosis not present

## 2016-11-07 DIAGNOSIS — E785 Hyperlipidemia, unspecified: Secondary | ICD-10-CM | POA: Diagnosis not present

## 2016-11-07 DIAGNOSIS — I10 Essential (primary) hypertension: Secondary | ICD-10-CM | POA: Diagnosis not present

## 2016-11-07 DIAGNOSIS — I872 Venous insufficiency (chronic) (peripheral): Secondary | ICD-10-CM | POA: Diagnosis not present

## 2016-11-07 DIAGNOSIS — F419 Anxiety disorder, unspecified: Secondary | ICD-10-CM | POA: Diagnosis not present

## 2016-11-10 ENCOUNTER — Ambulatory Visit: Payer: Medicare Other | Admitting: Physician Assistant

## 2016-11-13 DIAGNOSIS — I872 Venous insufficiency (chronic) (peripheral): Secondary | ICD-10-CM | POA: Diagnosis not present

## 2016-11-13 DIAGNOSIS — E785 Hyperlipidemia, unspecified: Secondary | ICD-10-CM | POA: Diagnosis not present

## 2016-11-13 DIAGNOSIS — E119 Type 2 diabetes mellitus without complications: Secondary | ICD-10-CM | POA: Diagnosis not present

## 2016-11-13 DIAGNOSIS — F329 Major depressive disorder, single episode, unspecified: Secondary | ICD-10-CM | POA: Diagnosis not present

## 2016-11-13 DIAGNOSIS — F419 Anxiety disorder, unspecified: Secondary | ICD-10-CM | POA: Diagnosis not present

## 2016-11-13 DIAGNOSIS — I10 Essential (primary) hypertension: Secondary | ICD-10-CM | POA: Diagnosis not present

## 2016-11-19 DIAGNOSIS — I872 Venous insufficiency (chronic) (peripheral): Secondary | ICD-10-CM | POA: Diagnosis not present

## 2016-11-19 DIAGNOSIS — F329 Major depressive disorder, single episode, unspecified: Secondary | ICD-10-CM | POA: Diagnosis not present

## 2016-11-19 DIAGNOSIS — F419 Anxiety disorder, unspecified: Secondary | ICD-10-CM | POA: Diagnosis not present

## 2016-11-19 DIAGNOSIS — E785 Hyperlipidemia, unspecified: Secondary | ICD-10-CM | POA: Diagnosis not present

## 2016-11-19 DIAGNOSIS — I10 Essential (primary) hypertension: Secondary | ICD-10-CM | POA: Diagnosis not present

## 2016-11-19 DIAGNOSIS — E119 Type 2 diabetes mellitus without complications: Secondary | ICD-10-CM | POA: Diagnosis not present

## 2016-11-20 DIAGNOSIS — I10 Essential (primary) hypertension: Secondary | ICD-10-CM | POA: Diagnosis not present

## 2016-11-20 DIAGNOSIS — E119 Type 2 diabetes mellitus without complications: Secondary | ICD-10-CM | POA: Diagnosis not present

## 2016-11-20 DIAGNOSIS — F419 Anxiety disorder, unspecified: Secondary | ICD-10-CM | POA: Diagnosis not present

## 2016-11-20 DIAGNOSIS — F329 Major depressive disorder, single episode, unspecified: Secondary | ICD-10-CM | POA: Diagnosis not present

## 2016-11-20 DIAGNOSIS — E785 Hyperlipidemia, unspecified: Secondary | ICD-10-CM | POA: Diagnosis not present

## 2016-11-20 DIAGNOSIS — I872 Venous insufficiency (chronic) (peripheral): Secondary | ICD-10-CM | POA: Diagnosis not present

## 2016-11-25 ENCOUNTER — Other Ambulatory Visit: Payer: Self-pay | Admitting: Physician Assistant

## 2016-11-25 ENCOUNTER — Ambulatory Visit: Payer: Medicare Other | Admitting: Physician Assistant

## 2016-11-26 ENCOUNTER — Other Ambulatory Visit: Payer: Self-pay | Admitting: Physician Assistant

## 2016-11-28 ENCOUNTER — Telehealth: Payer: Self-pay | Admitting: *Deleted

## 2016-11-28 DIAGNOSIS — R5381 Other malaise: Secondary | ICD-10-CM

## 2016-11-28 DIAGNOSIS — G894 Chronic pain syndrome: Secondary | ICD-10-CM

## 2016-11-28 DIAGNOSIS — R269 Unspecified abnormalities of gait and mobility: Secondary | ICD-10-CM

## 2016-11-28 NOTE — Telephone Encounter (Signed)
Pt is in need for evaluation for extra assistance.

## 2016-12-16 ENCOUNTER — Ambulatory Visit (INDEPENDENT_AMBULATORY_CARE_PROVIDER_SITE_OTHER): Payer: Medicare Other | Admitting: Physician Assistant

## 2016-12-16 ENCOUNTER — Encounter: Payer: Self-pay | Admitting: Physician Assistant

## 2016-12-16 VITALS — BP 147/72 | HR 91 | Ht 67.0 in

## 2016-12-16 DIAGNOSIS — I83001 Varicose veins of unspecified lower extremity with ulcer of thigh: Secondary | ICD-10-CM | POA: Diagnosis not present

## 2016-12-16 DIAGNOSIS — L97101 Non-pressure chronic ulcer of unspecified thigh limited to breakdown of skin: Secondary | ICD-10-CM | POA: Diagnosis not present

## 2016-12-16 DIAGNOSIS — E1122 Type 2 diabetes mellitus with diabetic chronic kidney disease: Secondary | ICD-10-CM

## 2016-12-16 DIAGNOSIS — N183 Chronic kidney disease, stage 3 (moderate): Secondary | ICD-10-CM | POA: Diagnosis not present

## 2016-12-16 LAB — POCT GLYCOSYLATED HEMOGLOBIN (HGB A1C): HEMOGLOBIN A1C: 7

## 2016-12-16 MED ORDER — HYDROCODONE-ACETAMINOPHEN 5-325 MG PO TABS: 1.0000 | ORAL_TABLET | Freq: Three times a day (TID) | ORAL | 0 refills | Status: DC | PRN

## 2016-12-16 MED ORDER — SAXAGLIPTIN-METFORMIN ER 2.5-1000 MG PO TB24: ORAL_TABLET | ORAL | 0 refills | Status: DC

## 2016-12-16 MED ORDER — ATORVASTATIN CALCIUM 40 MG PO TABS: 40.0000 mg | ORAL_TABLET | Freq: Every day | ORAL | 0 refills | Status: DC

## 2016-12-16 MED ORDER — GLIMEPIRIDE 4 MG PO TABS: 4.0000 mg | ORAL_TABLET | Freq: Two times a day (BID) | ORAL | 0 refills | Status: DC

## 2016-12-16 MED ORDER — AMOXICILLIN 500 MG PO CAPS
ORAL_CAPSULE | ORAL | 0 refills | Status: DC
Start: 2016-12-16 — End: 2017-02-09

## 2016-12-16 MED ORDER — DOXYCYCLINE HYCLATE 100 MG PO TABS: 100.0000 mg | ORAL_TABLET | Freq: Two times a day (BID) | ORAL | 0 refills | Status: DC

## 2016-12-16 NOTE — Patient Instructions (Signed)
Will get home health back at to home.  Start doxycycline.

## 2016-12-16 NOTE — Progress Notes (Addendum)
Subjective:    Patient ID: Bonnie Clark, female    DOB: Apr 16, 1941, 76 y.o.   MRN: [id number]  HPI  Patient is a 76yo female with a history of chronic venous stasis who presents to the clinic with ulcerations on bilateral lower legs.  Patient reports she went to wound care over 1 yr ago, and a home care nurse was coming to apply unna boots twice a week for 6 weeks.  Patient reports Coralee North, her home care nurse, has not been over since before christmas because her wounds had healed.  Patient reports these new blisters appeared 9 days ago and have since popped.  She states she has been using a stocking on both legs.  Patient describes the pain as an "ice pick" stabbing her legs.  Patient states the pain is worse at night and when in the dependent position.  Patient states she has been taking tylenol, which has not helped the pain.  She states the pain is better with elevation.  She also said that her and her husband have been using leftover unna boot supplies to try to wrap the lower legs, which has provided minimal improvement.  Patient would like Coralee North the home care nurse to come back to her house.    Patient states she is due for a root canal at the end of the week.  She would like a prescription for amoxicillin.    Review of Systems  Skin: Positive for wound (Bilateral leg pain with open wounds).  All other systems reviewed and are negative.      Objective:   Physical Exam  Constitutional: She is oriented to person, place, and time. She appears well-developed and well-nourished.  HENT:  Head: Normocephalic and atraumatic.  Cardiovascular: Normal rate, regular rhythm and normal heart sounds.   Pulmonary/Chest: Effort normal and breath sounds normal.  Neurological: She is alert and oriented to person, place, and time.  Skin:  Right lower leg: 2 open ulcerations anterolateral aspect. 71/2cm by 5cm adjacent to 7cm by 3cm  Evidence of surrounding erythema.   Pitting edema +1 biltareall y Warmth  to touch   Left leg: erythematous lower leg.  Ulceration on lateral mallelous region. 2cm by 2cm Warmth to touch and oozing.    Psychiatric: She has a normal mood and affect. Her behavior is normal.  Nursing note and vitals reviewed.         Assessment & Plan:  [name] KitchenMarland KitchenDiagnoses and all orders for this visit:  Type 2 diabetes mellitus with stage 3 chronic kidney disease, without long-term current use of insulin (HCC) -     POCT HgB A1C  Venous stasis ulcer of thigh limited to breakdown of skin with varicose veins, unspecified laterality (HCC) -     HYDROcodone-acetaminophen (NORCO/VICODIN) 5-325 MG tablet; Take 1-2 tablets by mouth every 8 (eight) hours as needed for moderate pain. -     doxycycline (VIBRA-TABS) 100 MG tablet; Take 1 tablet (100 mg total) by mouth 2 (two) times daily. For 10 days.  Other orders -     amoxicillin (AMOXIL) 500 MG capsule; Four by mouth one hour prior to procedure.  (Please fill equivalent with 1g tablets if available)   Patient requested Coralee North, her home care nurse, to come back to her house for twice weekly unna boot visits.  Unna boots applied bilaterally today in the office.  Patient given doxycycline 100mg  due to surrounding erythema and open ulcerations.    Patient encouraged to start back on Lasix 40mg   to decrease fluid overload.  Informed patient she can even half the dose if she does not want increased urination.    Encouraged patient to decrease salt intake to help with chronic venous stasis.     Patient given Norco for pain relief for 5 days supply only. Discussed only use when needed.  [place] controlled substance database viewed with no concerns.   Patient given Amoxicillin 500mg  to take before upcoming root canal procedure.    .. Lab Results  Component Value Date   HGBA1C 7.0 12/16/2016   A!C controlled at 7.0. Continue medication. Follow up in 3 months.   Follow-up as needed.

## 2016-12-16 NOTE — Progress Notes (Deleted)
71/2 5  7  by 3 2cm by 2cm

## 2016-12-16 NOTE — Addendum Note (Signed)
Addended by: Jomarie Longs on: 12/16/2016 04:57 PM   Modules accepted: Orders, Level of Service

## 2016-12-18 DIAGNOSIS — L03116 Cellulitis of left lower limb: Secondary | ICD-10-CM | POA: Diagnosis not present

## 2016-12-18 DIAGNOSIS — I83218 Varicose veins of right lower extremity with both ulcer of other part of lower extremity and inflammation: Secondary | ICD-10-CM | POA: Diagnosis not present

## 2016-12-18 DIAGNOSIS — I83029 Varicose veins of left lower extremity with ulcer of unspecified site: Secondary | ICD-10-CM | POA: Diagnosis not present

## 2016-12-18 DIAGNOSIS — L97811 Non-pressure chronic ulcer of other part of right lower leg limited to breakdown of skin: Secondary | ICD-10-CM | POA: Diagnosis not present

## 2016-12-18 DIAGNOSIS — L97821 Non-pressure chronic ulcer of other part of left lower leg limited to breakdown of skin: Secondary | ICD-10-CM | POA: Diagnosis not present

## 2016-12-18 DIAGNOSIS — L03115 Cellulitis of right lower limb: Secondary | ICD-10-CM | POA: Diagnosis not present

## 2016-12-22 DIAGNOSIS — L03115 Cellulitis of right lower limb: Secondary | ICD-10-CM | POA: Diagnosis not present

## 2016-12-22 DIAGNOSIS — I83029 Varicose veins of left lower extremity with ulcer of unspecified site: Secondary | ICD-10-CM | POA: Diagnosis not present

## 2016-12-22 DIAGNOSIS — L03116 Cellulitis of left lower limb: Secondary | ICD-10-CM | POA: Diagnosis not present

## 2016-12-22 DIAGNOSIS — L97811 Non-pressure chronic ulcer of other part of right lower leg limited to breakdown of skin: Secondary | ICD-10-CM | POA: Diagnosis not present

## 2016-12-22 DIAGNOSIS — I83218 Varicose veins of right lower extremity with both ulcer of other part of lower extremity and inflammation: Secondary | ICD-10-CM | POA: Diagnosis not present

## 2016-12-22 DIAGNOSIS — L97821 Non-pressure chronic ulcer of other part of left lower leg limited to breakdown of skin: Secondary | ICD-10-CM | POA: Diagnosis not present

## 2016-12-23 DIAGNOSIS — L97811 Non-pressure chronic ulcer of other part of right lower leg limited to breakdown of skin: Secondary | ICD-10-CM | POA: Diagnosis not present

## 2016-12-23 DIAGNOSIS — I83029 Varicose veins of left lower extremity with ulcer of unspecified site: Secondary | ICD-10-CM | POA: Diagnosis not present

## 2016-12-23 DIAGNOSIS — I83218 Varicose veins of right lower extremity with both ulcer of other part of lower extremity and inflammation: Secondary | ICD-10-CM | POA: Diagnosis not present

## 2016-12-23 DIAGNOSIS — L03115 Cellulitis of right lower limb: Secondary | ICD-10-CM | POA: Diagnosis not present

## 2016-12-25 DIAGNOSIS — I83218 Varicose veins of right lower extremity with both ulcer of other part of lower extremity and inflammation: Secondary | ICD-10-CM | POA: Diagnosis not present

## 2016-12-25 DIAGNOSIS — L97811 Non-pressure chronic ulcer of other part of right lower leg limited to breakdown of skin: Secondary | ICD-10-CM | POA: Diagnosis not present

## 2016-12-25 DIAGNOSIS — L03115 Cellulitis of right lower limb: Secondary | ICD-10-CM | POA: Diagnosis not present

## 2016-12-25 DIAGNOSIS — L97821 Non-pressure chronic ulcer of other part of left lower leg limited to breakdown of skin: Secondary | ICD-10-CM | POA: Diagnosis not present

## 2016-12-25 DIAGNOSIS — I83029 Varicose veins of left lower extremity with ulcer of unspecified site: Secondary | ICD-10-CM | POA: Diagnosis not present

## 2016-12-25 DIAGNOSIS — L03116 Cellulitis of left lower limb: Secondary | ICD-10-CM | POA: Diagnosis not present

## 2016-12-29 ENCOUNTER — Other Ambulatory Visit: Payer: Self-pay | Admitting: Cardiology

## 2016-12-29 DIAGNOSIS — I83218 Varicose veins of right lower extremity with both ulcer of other part of lower extremity and inflammation: Secondary | ICD-10-CM | POA: Diagnosis not present

## 2016-12-29 DIAGNOSIS — L03115 Cellulitis of right lower limb: Secondary | ICD-10-CM | POA: Diagnosis not present

## 2016-12-29 DIAGNOSIS — I83029 Varicose veins of left lower extremity with ulcer of unspecified site: Secondary | ICD-10-CM | POA: Diagnosis not present

## 2016-12-29 DIAGNOSIS — L97811 Non-pressure chronic ulcer of other part of right lower leg limited to breakdown of skin: Secondary | ICD-10-CM | POA: Diagnosis not present

## 2016-12-29 DIAGNOSIS — L03116 Cellulitis of left lower limb: Secondary | ICD-10-CM | POA: Diagnosis not present

## 2016-12-29 DIAGNOSIS — L97821 Non-pressure chronic ulcer of other part of left lower leg limited to breakdown of skin: Secondary | ICD-10-CM | POA: Diagnosis not present

## 2017-01-01 ENCOUNTER — Institutional Professional Consult (permissible substitution): Payer: Medicare Other | Admitting: Pulmonary Disease

## 2017-01-01 DIAGNOSIS — L03115 Cellulitis of right lower limb: Secondary | ICD-10-CM | POA: Diagnosis not present

## 2017-01-01 DIAGNOSIS — L97821 Non-pressure chronic ulcer of other part of left lower leg limited to breakdown of skin: Secondary | ICD-10-CM | POA: Diagnosis not present

## 2017-01-01 DIAGNOSIS — L03116 Cellulitis of left lower limb: Secondary | ICD-10-CM | POA: Diagnosis not present

## 2017-01-01 DIAGNOSIS — I83029 Varicose veins of left lower extremity with ulcer of unspecified site: Secondary | ICD-10-CM | POA: Diagnosis not present

## 2017-01-01 DIAGNOSIS — I83218 Varicose veins of right lower extremity with both ulcer of other part of lower extremity and inflammation: Secondary | ICD-10-CM | POA: Diagnosis not present

## 2017-01-01 DIAGNOSIS — L97811 Non-pressure chronic ulcer of other part of right lower leg limited to breakdown of skin: Secondary | ICD-10-CM | POA: Diagnosis not present

## 2017-01-02 DIAGNOSIS — L97821 Non-pressure chronic ulcer of other part of left lower leg limited to breakdown of skin: Secondary | ICD-10-CM | POA: Diagnosis not present

## 2017-01-02 DIAGNOSIS — L03116 Cellulitis of left lower limb: Secondary | ICD-10-CM | POA: Diagnosis not present

## 2017-01-02 DIAGNOSIS — I83029 Varicose veins of left lower extremity with ulcer of unspecified site: Secondary | ICD-10-CM | POA: Diagnosis not present

## 2017-01-02 DIAGNOSIS — L03115 Cellulitis of right lower limb: Secondary | ICD-10-CM | POA: Diagnosis not present

## 2017-01-02 DIAGNOSIS — L97811 Non-pressure chronic ulcer of other part of right lower leg limited to breakdown of skin: Secondary | ICD-10-CM | POA: Diagnosis not present

## 2017-01-02 DIAGNOSIS — I83218 Varicose veins of right lower extremity with both ulcer of other part of lower extremity and inflammation: Secondary | ICD-10-CM | POA: Diagnosis not present

## 2017-01-05 ENCOUNTER — Other Ambulatory Visit: Payer: Self-pay

## 2017-01-05 DIAGNOSIS — I83029 Varicose veins of left lower extremity with ulcer of unspecified site: Secondary | ICD-10-CM | POA: Diagnosis not present

## 2017-01-05 DIAGNOSIS — L03116 Cellulitis of left lower limb: Secondary | ICD-10-CM | POA: Diagnosis not present

## 2017-01-05 DIAGNOSIS — L97821 Non-pressure chronic ulcer of other part of left lower leg limited to breakdown of skin: Secondary | ICD-10-CM | POA: Diagnosis not present

## 2017-01-05 DIAGNOSIS — I83218 Varicose veins of right lower extremity with both ulcer of other part of lower extremity and inflammation: Secondary | ICD-10-CM | POA: Diagnosis not present

## 2017-01-05 DIAGNOSIS — L03115 Cellulitis of right lower limb: Secondary | ICD-10-CM | POA: Diagnosis not present

## 2017-01-05 DIAGNOSIS — L97811 Non-pressure chronic ulcer of other part of right lower leg limited to breakdown of skin: Secondary | ICD-10-CM | POA: Diagnosis not present

## 2017-01-05 MED ORDER — ALLOPURINOL 300 MG PO TABS: 300.0000 mg | ORAL_TABLET | Freq: Every day | ORAL | 0 refills | Status: DC

## 2017-01-09 DIAGNOSIS — I83029 Varicose veins of left lower extremity with ulcer of unspecified site: Secondary | ICD-10-CM | POA: Diagnosis not present

## 2017-01-09 DIAGNOSIS — L03115 Cellulitis of right lower limb: Secondary | ICD-10-CM | POA: Diagnosis not present

## 2017-01-09 DIAGNOSIS — L97821 Non-pressure chronic ulcer of other part of left lower leg limited to breakdown of skin: Secondary | ICD-10-CM | POA: Diagnosis not present

## 2017-01-09 DIAGNOSIS — L97811 Non-pressure chronic ulcer of other part of right lower leg limited to breakdown of skin: Secondary | ICD-10-CM | POA: Diagnosis not present

## 2017-01-09 DIAGNOSIS — L03116 Cellulitis of left lower limb: Secondary | ICD-10-CM | POA: Diagnosis not present

## 2017-01-09 DIAGNOSIS — I83218 Varicose veins of right lower extremity with both ulcer of other part of lower extremity and inflammation: Secondary | ICD-10-CM | POA: Diagnosis not present

## 2017-01-10 DIAGNOSIS — L97821 Non-pressure chronic ulcer of other part of left lower leg limited to breakdown of skin: Secondary | ICD-10-CM | POA: Diagnosis not present

## 2017-01-10 DIAGNOSIS — L97811 Non-pressure chronic ulcer of other part of right lower leg limited to breakdown of skin: Secondary | ICD-10-CM | POA: Diagnosis not present

## 2017-01-10 DIAGNOSIS — L03115 Cellulitis of right lower limb: Secondary | ICD-10-CM | POA: Diagnosis not present

## 2017-01-10 DIAGNOSIS — I83029 Varicose veins of left lower extremity with ulcer of unspecified site: Secondary | ICD-10-CM | POA: Diagnosis not present

## 2017-01-10 DIAGNOSIS — I83218 Varicose veins of right lower extremity with both ulcer of other part of lower extremity and inflammation: Secondary | ICD-10-CM | POA: Diagnosis not present

## 2017-01-10 DIAGNOSIS — L03116 Cellulitis of left lower limb: Secondary | ICD-10-CM | POA: Diagnosis not present

## 2017-01-12 DIAGNOSIS — L03116 Cellulitis of left lower limb: Secondary | ICD-10-CM | POA: Diagnosis not present

## 2017-01-12 DIAGNOSIS — L97821 Non-pressure chronic ulcer of other part of left lower leg limited to breakdown of skin: Secondary | ICD-10-CM | POA: Diagnosis not present

## 2017-01-12 DIAGNOSIS — L03115 Cellulitis of right lower limb: Secondary | ICD-10-CM | POA: Diagnosis not present

## 2017-01-12 DIAGNOSIS — I83218 Varicose veins of right lower extremity with both ulcer of other part of lower extremity and inflammation: Secondary | ICD-10-CM | POA: Diagnosis not present

## 2017-01-12 DIAGNOSIS — I83029 Varicose veins of left lower extremity with ulcer of unspecified site: Secondary | ICD-10-CM | POA: Diagnosis not present

## 2017-01-12 DIAGNOSIS — L97811 Non-pressure chronic ulcer of other part of right lower leg limited to breakdown of skin: Secondary | ICD-10-CM | POA: Diagnosis not present

## 2017-01-13 DIAGNOSIS — L97811 Non-pressure chronic ulcer of other part of right lower leg limited to breakdown of skin: Secondary | ICD-10-CM | POA: Diagnosis not present

## 2017-01-13 DIAGNOSIS — I83029 Varicose veins of left lower extremity with ulcer of unspecified site: Secondary | ICD-10-CM | POA: Diagnosis not present

## 2017-01-13 DIAGNOSIS — L03116 Cellulitis of left lower limb: Secondary | ICD-10-CM | POA: Diagnosis not present

## 2017-01-13 DIAGNOSIS — L97821 Non-pressure chronic ulcer of other part of left lower leg limited to breakdown of skin: Secondary | ICD-10-CM | POA: Diagnosis not present

## 2017-01-13 DIAGNOSIS — L03115 Cellulitis of right lower limb: Secondary | ICD-10-CM | POA: Diagnosis not present

## 2017-01-13 DIAGNOSIS — I83218 Varicose veins of right lower extremity with both ulcer of other part of lower extremity and inflammation: Secondary | ICD-10-CM | POA: Diagnosis not present

## 2017-01-15 DIAGNOSIS — L97811 Non-pressure chronic ulcer of other part of right lower leg limited to breakdown of skin: Secondary | ICD-10-CM | POA: Diagnosis not present

## 2017-01-15 DIAGNOSIS — L03115 Cellulitis of right lower limb: Secondary | ICD-10-CM | POA: Diagnosis not present

## 2017-01-15 DIAGNOSIS — L03116 Cellulitis of left lower limb: Secondary | ICD-10-CM | POA: Diagnosis not present

## 2017-01-15 DIAGNOSIS — I83218 Varicose veins of right lower extremity with both ulcer of other part of lower extremity and inflammation: Secondary | ICD-10-CM | POA: Diagnosis not present

## 2017-01-15 DIAGNOSIS — I83029 Varicose veins of left lower extremity with ulcer of unspecified site: Secondary | ICD-10-CM | POA: Diagnosis not present

## 2017-01-15 DIAGNOSIS — L97821 Non-pressure chronic ulcer of other part of left lower leg limited to breakdown of skin: Secondary | ICD-10-CM | POA: Diagnosis not present

## 2017-01-20 DIAGNOSIS — L03116 Cellulitis of left lower limb: Secondary | ICD-10-CM | POA: Diagnosis not present

## 2017-01-20 DIAGNOSIS — I83029 Varicose veins of left lower extremity with ulcer of unspecified site: Secondary | ICD-10-CM | POA: Diagnosis not present

## 2017-01-20 DIAGNOSIS — L03115 Cellulitis of right lower limb: Secondary | ICD-10-CM | POA: Diagnosis not present

## 2017-01-20 DIAGNOSIS — L97821 Non-pressure chronic ulcer of other part of left lower leg limited to breakdown of skin: Secondary | ICD-10-CM | POA: Diagnosis not present

## 2017-01-20 DIAGNOSIS — I83218 Varicose veins of right lower extremity with both ulcer of other part of lower extremity and inflammation: Secondary | ICD-10-CM | POA: Diagnosis not present

## 2017-01-20 DIAGNOSIS — L97811 Non-pressure chronic ulcer of other part of right lower leg limited to breakdown of skin: Secondary | ICD-10-CM | POA: Diagnosis not present

## 2017-01-22 DIAGNOSIS — L03116 Cellulitis of left lower limb: Secondary | ICD-10-CM | POA: Diagnosis not present

## 2017-01-22 DIAGNOSIS — L97821 Non-pressure chronic ulcer of other part of left lower leg limited to breakdown of skin: Secondary | ICD-10-CM | POA: Diagnosis not present

## 2017-01-22 DIAGNOSIS — I83218 Varicose veins of right lower extremity with both ulcer of other part of lower extremity and inflammation: Secondary | ICD-10-CM | POA: Diagnosis not present

## 2017-01-22 DIAGNOSIS — I83029 Varicose veins of left lower extremity with ulcer of unspecified site: Secondary | ICD-10-CM | POA: Diagnosis not present

## 2017-01-22 DIAGNOSIS — L97811 Non-pressure chronic ulcer of other part of right lower leg limited to breakdown of skin: Secondary | ICD-10-CM | POA: Diagnosis not present

## 2017-01-22 DIAGNOSIS — L03115 Cellulitis of right lower limb: Secondary | ICD-10-CM | POA: Diagnosis not present

## 2017-01-23 DIAGNOSIS — L97821 Non-pressure chronic ulcer of other part of left lower leg limited to breakdown of skin: Secondary | ICD-10-CM | POA: Diagnosis not present

## 2017-01-23 DIAGNOSIS — L97811 Non-pressure chronic ulcer of other part of right lower leg limited to breakdown of skin: Secondary | ICD-10-CM | POA: Diagnosis not present

## 2017-01-23 DIAGNOSIS — I83029 Varicose veins of left lower extremity with ulcer of unspecified site: Secondary | ICD-10-CM | POA: Diagnosis not present

## 2017-01-23 DIAGNOSIS — L03116 Cellulitis of left lower limb: Secondary | ICD-10-CM | POA: Diagnosis not present

## 2017-01-23 DIAGNOSIS — L03115 Cellulitis of right lower limb: Secondary | ICD-10-CM | POA: Diagnosis not present

## 2017-01-23 DIAGNOSIS — I83218 Varicose veins of right lower extremity with both ulcer of other part of lower extremity and inflammation: Secondary | ICD-10-CM | POA: Diagnosis not present

## 2017-01-27 DIAGNOSIS — I83218 Varicose veins of right lower extremity with both ulcer of other part of lower extremity and inflammation: Secondary | ICD-10-CM | POA: Diagnosis not present

## 2017-01-27 DIAGNOSIS — L03116 Cellulitis of left lower limb: Secondary | ICD-10-CM | POA: Diagnosis not present

## 2017-01-27 DIAGNOSIS — I83029 Varicose veins of left lower extremity with ulcer of unspecified site: Secondary | ICD-10-CM | POA: Diagnosis not present

## 2017-01-27 DIAGNOSIS — L97821 Non-pressure chronic ulcer of other part of left lower leg limited to breakdown of skin: Secondary | ICD-10-CM | POA: Diagnosis not present

## 2017-01-27 DIAGNOSIS — L97811 Non-pressure chronic ulcer of other part of right lower leg limited to breakdown of skin: Secondary | ICD-10-CM | POA: Diagnosis not present

## 2017-01-27 DIAGNOSIS — L03115 Cellulitis of right lower limb: Secondary | ICD-10-CM | POA: Diagnosis not present

## 2017-01-29 DIAGNOSIS — I83218 Varicose veins of right lower extremity with both ulcer of other part of lower extremity and inflammation: Secondary | ICD-10-CM | POA: Diagnosis not present

## 2017-01-29 DIAGNOSIS — L03116 Cellulitis of left lower limb: Secondary | ICD-10-CM | POA: Diagnosis not present

## 2017-01-29 DIAGNOSIS — L03115 Cellulitis of right lower limb: Secondary | ICD-10-CM | POA: Diagnosis not present

## 2017-01-29 DIAGNOSIS — L97811 Non-pressure chronic ulcer of other part of right lower leg limited to breakdown of skin: Secondary | ICD-10-CM | POA: Diagnosis not present

## 2017-01-29 DIAGNOSIS — L97821 Non-pressure chronic ulcer of other part of left lower leg limited to breakdown of skin: Secondary | ICD-10-CM | POA: Diagnosis not present

## 2017-01-29 DIAGNOSIS — I83029 Varicose veins of left lower extremity with ulcer of unspecified site: Secondary | ICD-10-CM | POA: Diagnosis not present

## 2017-02-03 ENCOUNTER — Ambulatory Visit: Payer: Medicare Other | Admitting: Physician Assistant

## 2017-02-04 ENCOUNTER — Ambulatory Visit: Payer: Medicare Other | Admitting: Physician Assistant

## 2017-02-05 DIAGNOSIS — I83029 Varicose veins of left lower extremity with ulcer of unspecified site: Secondary | ICD-10-CM | POA: Diagnosis not present

## 2017-02-05 DIAGNOSIS — L97811 Non-pressure chronic ulcer of other part of right lower leg limited to breakdown of skin: Secondary | ICD-10-CM | POA: Diagnosis not present

## 2017-02-05 DIAGNOSIS — I83218 Varicose veins of right lower extremity with both ulcer of other part of lower extremity and inflammation: Secondary | ICD-10-CM | POA: Diagnosis not present

## 2017-02-05 DIAGNOSIS — L03116 Cellulitis of left lower limb: Secondary | ICD-10-CM | POA: Diagnosis not present

## 2017-02-05 DIAGNOSIS — L03115 Cellulitis of right lower limb: Secondary | ICD-10-CM | POA: Diagnosis not present

## 2017-02-05 DIAGNOSIS — L97821 Non-pressure chronic ulcer of other part of left lower leg limited to breakdown of skin: Secondary | ICD-10-CM | POA: Diagnosis not present

## 2017-02-09 ENCOUNTER — Other Ambulatory Visit: Payer: Self-pay | Admitting: *Deleted

## 2017-02-09 MED ORDER — AMOXICILLIN 500 MG PO CAPS: ORAL_CAPSULE | ORAL | 0 refills | Status: DC

## 2017-02-10 ENCOUNTER — Other Ambulatory Visit: Payer: Self-pay | Admitting: Physician Assistant

## 2017-02-10 ENCOUNTER — Institutional Professional Consult (permissible substitution): Payer: Medicare Other | Admitting: Pulmonary Disease

## 2017-02-11 DIAGNOSIS — L03116 Cellulitis of left lower limb: Secondary | ICD-10-CM | POA: Diagnosis not present

## 2017-02-11 DIAGNOSIS — L03115 Cellulitis of right lower limb: Secondary | ICD-10-CM | POA: Diagnosis not present

## 2017-02-11 DIAGNOSIS — I83029 Varicose veins of left lower extremity with ulcer of unspecified site: Secondary | ICD-10-CM | POA: Diagnosis not present

## 2017-02-11 DIAGNOSIS — L97821 Non-pressure chronic ulcer of other part of left lower leg limited to breakdown of skin: Secondary | ICD-10-CM | POA: Diagnosis not present

## 2017-02-11 DIAGNOSIS — L97811 Non-pressure chronic ulcer of other part of right lower leg limited to breakdown of skin: Secondary | ICD-10-CM | POA: Diagnosis not present

## 2017-02-11 DIAGNOSIS — I83218 Varicose veins of right lower extremity with both ulcer of other part of lower extremity and inflammation: Secondary | ICD-10-CM | POA: Diagnosis not present

## 2017-02-12 ENCOUNTER — Telehealth: Payer: Self-pay

## 2017-02-12 NOTE — Telephone Encounter (Signed)
Levert Feinstein from [facility] [phone]) called did recert on pt and would like a verbal order to go from the [facility] boots to a 4 layer compression wrap.  She stated that healing is quicker with those.  Please advise.

## 2017-02-13 NOTE — Telephone Encounter (Signed)
Ok to wrap in [place] boot with 4 layer compression wrap.

## 2017-02-13 NOTE — Telephone Encounter (Signed)
Notified [facility].

## 2017-02-16 DIAGNOSIS — E669 Obesity, unspecified: Secondary | ICD-10-CM | POA: Diagnosis not present

## 2017-02-16 DIAGNOSIS — I83218 Varicose veins of right lower extremity with both ulcer of other part of lower extremity and inflammation: Secondary | ICD-10-CM | POA: Diagnosis not present

## 2017-02-16 DIAGNOSIS — N183 Chronic kidney disease, stage 3 (moderate): Secondary | ICD-10-CM | POA: Diagnosis not present

## 2017-02-16 DIAGNOSIS — I129 Hypertensive chronic kidney disease with stage 1 through stage 4 chronic kidney disease, or unspecified chronic kidney disease: Secondary | ICD-10-CM | POA: Diagnosis not present

## 2017-02-16 DIAGNOSIS — L97811 Non-pressure chronic ulcer of other part of right lower leg limited to breakdown of skin: Secondary | ICD-10-CM | POA: Diagnosis not present

## 2017-02-16 DIAGNOSIS — E1122 Type 2 diabetes mellitus with diabetic chronic kidney disease: Secondary | ICD-10-CM | POA: Diagnosis not present

## 2017-02-17 DIAGNOSIS — E1122 Type 2 diabetes mellitus with diabetic chronic kidney disease: Secondary | ICD-10-CM | POA: Diagnosis not present

## 2017-02-17 DIAGNOSIS — I83218 Varicose veins of right lower extremity with both ulcer of other part of lower extremity and inflammation: Secondary | ICD-10-CM | POA: Diagnosis not present

## 2017-02-17 DIAGNOSIS — L97811 Non-pressure chronic ulcer of other part of right lower leg limited to breakdown of skin: Secondary | ICD-10-CM | POA: Diagnosis not present

## 2017-02-17 DIAGNOSIS — E669 Obesity, unspecified: Secondary | ICD-10-CM | POA: Diagnosis not present

## 2017-02-17 DIAGNOSIS — I129 Hypertensive chronic kidney disease with stage 1 through stage 4 chronic kidney disease, or unspecified chronic kidney disease: Secondary | ICD-10-CM | POA: Diagnosis not present

## 2017-02-17 DIAGNOSIS — N183 Chronic kidney disease, stage 3 (moderate): Secondary | ICD-10-CM | POA: Diagnosis not present

## 2017-02-18 ENCOUNTER — Other Ambulatory Visit: Payer: Self-pay | Admitting: Physician Assistant

## 2017-02-23 ENCOUNTER — Telehealth: Payer: Self-pay | Admitting: *Deleted

## 2017-02-23 DIAGNOSIS — L97811 Non-pressure chronic ulcer of other part of right lower leg limited to breakdown of skin: Secondary | ICD-10-CM | POA: Diagnosis not present

## 2017-02-23 DIAGNOSIS — I129 Hypertensive chronic kidney disease with stage 1 through stage 4 chronic kidney disease, or unspecified chronic kidney disease: Secondary | ICD-10-CM | POA: Diagnosis not present

## 2017-02-23 DIAGNOSIS — N183 Chronic kidney disease, stage 3 (moderate): Secondary | ICD-10-CM | POA: Diagnosis not present

## 2017-02-23 DIAGNOSIS — E1122 Type 2 diabetes mellitus with diabetic chronic kidney disease: Secondary | ICD-10-CM | POA: Diagnosis not present

## 2017-02-23 DIAGNOSIS — E669 Obesity, unspecified: Secondary | ICD-10-CM | POA: Diagnosis not present

## 2017-02-23 DIAGNOSIS — I83218 Varicose veins of right lower extremity with both ulcer of other part of lower extremity and inflammation: Secondary | ICD-10-CM | POA: Diagnosis not present

## 2017-02-23 NOTE — Telephone Encounter (Signed)
Authorization given for continued care for pt.Bonnie Clark [place]

## 2017-02-27 DIAGNOSIS — I129 Hypertensive chronic kidney disease with stage 1 through stage 4 chronic kidney disease, or unspecified chronic kidney disease: Secondary | ICD-10-CM | POA: Diagnosis not present

## 2017-02-27 DIAGNOSIS — I83218 Varicose veins of right lower extremity with both ulcer of other part of lower extremity and inflammation: Secondary | ICD-10-CM | POA: Diagnosis not present

## 2017-02-27 DIAGNOSIS — N183 Chronic kidney disease, stage 3 (moderate): Secondary | ICD-10-CM | POA: Diagnosis not present

## 2017-02-27 DIAGNOSIS — E669 Obesity, unspecified: Secondary | ICD-10-CM | POA: Diagnosis not present

## 2017-02-27 DIAGNOSIS — E1122 Type 2 diabetes mellitus with diabetic chronic kidney disease: Secondary | ICD-10-CM | POA: Diagnosis not present

## 2017-02-27 DIAGNOSIS — L97811 Non-pressure chronic ulcer of other part of right lower leg limited to breakdown of skin: Secondary | ICD-10-CM | POA: Diagnosis not present

## 2017-03-02 ENCOUNTER — Telehealth: Payer: Self-pay | Admitting: Physician Assistant

## 2017-03-02 DIAGNOSIS — L97811 Non-pressure chronic ulcer of other part of right lower leg limited to breakdown of skin: Secondary | ICD-10-CM | POA: Diagnosis not present

## 2017-03-02 DIAGNOSIS — E669 Obesity, unspecified: Secondary | ICD-10-CM | POA: Diagnosis not present

## 2017-03-02 DIAGNOSIS — I129 Hypertensive chronic kidney disease with stage 1 through stage 4 chronic kidney disease, or unspecified chronic kidney disease: Secondary | ICD-10-CM | POA: Diagnosis not present

## 2017-03-02 DIAGNOSIS — E1122 Type 2 diabetes mellitus with diabetic chronic kidney disease: Secondary | ICD-10-CM | POA: Diagnosis not present

## 2017-03-02 DIAGNOSIS — N183 Chronic kidney disease, stage 3 (moderate): Secondary | ICD-10-CM | POA: Diagnosis not present

## 2017-03-02 DIAGNOSIS — I83218 Varicose veins of right lower extremity with both ulcer of other part of lower extremity and inflammation: Secondary | ICD-10-CM | POA: Diagnosis not present

## 2017-03-02 NOTE — Telephone Encounter (Signed)
Pt is eligible for a new wheel chair and when they called [facility] they stated they need a letter from you Authorizing a new wheelchair. You can have the letter faxed to [phone] or patient can pick up letter Wednesday when they come in for their apptointment.

## 2017-03-02 NOTE — Telephone Encounter (Signed)
Can we find out if there is a certain kind of wheelchair? I am assuming this is her power wheelchair, correct. If so yes can make rx.

## 2017-03-03 ENCOUNTER — Ambulatory Visit: Payer: Medicare Other | Admitting: Physician Assistant

## 2017-03-04 ENCOUNTER — Ambulatory Visit (INDEPENDENT_AMBULATORY_CARE_PROVIDER_SITE_OTHER): Payer: Medicare Other | Admitting: Physician Assistant

## 2017-03-04 ENCOUNTER — Encounter: Payer: Self-pay | Admitting: Physician Assistant

## 2017-03-04 VITALS — BP 123/73 | HR 90

## 2017-03-04 DIAGNOSIS — M5136 Other intervertebral disc degeneration, lumbar region: Secondary | ICD-10-CM

## 2017-03-04 DIAGNOSIS — R29898 Other symptoms and signs involving the musculoskeletal system: Secondary | ICD-10-CM | POA: Diagnosis not present

## 2017-03-04 DIAGNOSIS — N3281 Overactive bladder: Secondary | ICD-10-CM | POA: Diagnosis not present

## 2017-03-04 DIAGNOSIS — R2689 Other abnormalities of gait and mobility: Secondary | ICD-10-CM

## 2017-03-04 MED ORDER — MIRABEGRON ER 50 MG PO TB24: 50.0000 mg | ORAL_TABLET | Freq: Every day | ORAL | 1 refills | Status: DC

## 2017-03-04 MED ORDER — AMBULATORY NON FORMULARY MEDICATION: 0 refills | Status: DC

## 2017-03-04 NOTE — Telephone Encounter (Signed)
Please call home care who manages chronic venous stasis. She complains of new wrap being very irritating and would like to have una boots placed. She stated I needed to call and let home care know so that they would consider.

## 2017-03-04 NOTE — Progress Notes (Signed)
   Subjective:    Patient ID: Bonnie Clark, female    DOB: July 06, 1941, 76 y.o.   MRN: [id number]  HPI  Pt is a 76 yo morbidly obese female who comes in on a motor wheelchair. She would like to discuss overactive bladder symptoms. She comes in with myrbetriq handout she picked up somewhere. She has had OAB stress and urgency for over 5 years. Until the last 6 months it has been manageable. Denies any abdominal pain, dysuria, n/v. She gets up 4 times a night and has to wear a pad during the day due to her leakage. When she has to go and she does not have a lot of control over bladder.   Her power wheelchair is having some issues and her insurance will pay for another one if rx is written.  Review of Systems  All other systems reviewed and are negative.      Objective:   Physical Exam  Constitutional: She is oriented to person, place, and time. She appears well-developed and well-nourished.  Morbidly obese.   HENT:  Head: Normocephalic and atraumatic.  Cardiovascular: Normal rate, regular rhythm and normal heart sounds.   Pulmonary/Chest: Effort normal and breath sounds normal.  Negative for CVA tenderness.   Abdominal: Soft. Bowel sounds are normal. She exhibits no distension and no mass. There is no tenderness. There is no rebound and no guarding.  Neurological: She is alert and oriented to person, place, and time.  Skin:  Bilateral legs wrapped today.   Psychiatric: She has a normal mood and affect. Her behavior is normal.          Assessment & Plan:  [name] KitchenMarland KitchenDiagnoses and all orders for this visit:  OAB (overactive bladder) -     mirabegron ER (MYRBETRIQ) 50 MG TB24 tablet; Take 1 tablet (50 mg total) by mouth daily.  Balance problems -     AMBULATORY NON FORMULARY MEDICATION; Need for one power wheelchair due to balance instability, lower extremity weakness, and lumbar DDD.  DX: R26.89, R29.898, M51.36  Weakness of lower extremity, unspecified laterality -     AMBULATORY NON  FORMULARY MEDICATION; Need for one power wheelchair due to balance instability, lower extremity weakness, and lumbar DDD.  DX: R26.89, R29.898, M51.36  Lumbar degenerative disc disease -     AMBULATORY NON FORMULARY MEDICATION; Need for one power wheelchair due to balance instability, lower extremity weakness, and lumbar DDD.  DX: R26.89, R29.898, M51.36  Morbid obesity (HCC) -     AMBULATORY NON FORMULARY MEDICATION; Need for one power wheelchair due to balance instability, lower extremity weakness, and lumbar DDD.  DX: R26.89, R29.898, M51.36  Other orders -     Discontinue: AMBULATORY NON FORMULARY MEDICATION; Need for one power wheelchair due to balance instability, lower extremity weakness, and lumbar DDD.  DX: R26.89, R29.898, M51.36   Started myrbetriq. Discussed side effects. Discussed giving at least 6 weeks to work. Follow up in 6-8 weeks. Goal would be 50 percent of symptoms reduced. HO given on OAB. Pt is aware that days she takes lasix will make symptoms worse.   Pt has home health coming to her house for lower extremity wound care. They recently started a new wrap the is dryer than unna boot. She would like to go back to [employer]. She felt like she was getting better results and less dryness and itchiness. I will get a nurse to follow up. Bilateral legs wrapped today.

## 2017-03-04 NOTE — Patient Instructions (Signed)

## 2017-03-05 DIAGNOSIS — I129 Hypertensive chronic kidney disease with stage 1 through stage 4 chronic kidney disease, or unspecified chronic kidney disease: Secondary | ICD-10-CM | POA: Diagnosis not present

## 2017-03-05 DIAGNOSIS — L97811 Non-pressure chronic ulcer of other part of right lower leg limited to breakdown of skin: Secondary | ICD-10-CM | POA: Diagnosis not present

## 2017-03-05 DIAGNOSIS — I83218 Varicose veins of right lower extremity with both ulcer of other part of lower extremity and inflammation: Secondary | ICD-10-CM | POA: Diagnosis not present

## 2017-03-05 DIAGNOSIS — N183 Chronic kidney disease, stage 3 (moderate): Secondary | ICD-10-CM | POA: Diagnosis not present

## 2017-03-05 DIAGNOSIS — E1122 Type 2 diabetes mellitus with diabetic chronic kidney disease: Secondary | ICD-10-CM | POA: Diagnosis not present

## 2017-03-05 DIAGNOSIS — E669 Obesity, unspecified: Secondary | ICD-10-CM | POA: Diagnosis not present

## 2017-03-05 NOTE — Telephone Encounter (Signed)
Levert Feinstein advised, verbalized understanding. They ulcers are healing well, they plan to discharge in 2 weeks. Will use unna boots until then.

## 2017-03-06 ENCOUNTER — Encounter: Payer: Self-pay | Admitting: Physician Assistant

## 2017-03-06 DIAGNOSIS — H43813 Vitreous degeneration, bilateral: Secondary | ICD-10-CM | POA: Diagnosis not present

## 2017-03-06 DIAGNOSIS — Z7984 Long term (current) use of oral hypoglycemic drugs: Secondary | ICD-10-CM | POA: Diagnosis not present

## 2017-03-06 DIAGNOSIS — R29898 Other symptoms and signs involving the musculoskeletal system: Secondary | ICD-10-CM | POA: Insufficient documentation

## 2017-03-06 DIAGNOSIS — Z961 Presence of intraocular lens: Secondary | ICD-10-CM | POA: Diagnosis not present

## 2017-03-06 DIAGNOSIS — H527 Unspecified disorder of refraction: Secondary | ICD-10-CM | POA: Diagnosis not present

## 2017-03-06 DIAGNOSIS — E119 Type 2 diabetes mellitus without complications: Secondary | ICD-10-CM | POA: Diagnosis not present

## 2017-03-06 DIAGNOSIS — R2689 Other abnormalities of gait and mobility: Secondary | ICD-10-CM | POA: Insufficient documentation

## 2017-03-06 LAB — HM DIABETES EYE EXAM

## 2017-03-10 DIAGNOSIS — I129 Hypertensive chronic kidney disease with stage 1 through stage 4 chronic kidney disease, or unspecified chronic kidney disease: Secondary | ICD-10-CM | POA: Diagnosis not present

## 2017-03-10 DIAGNOSIS — N183 Chronic kidney disease, stage 3 (moderate): Secondary | ICD-10-CM | POA: Diagnosis not present

## 2017-03-10 DIAGNOSIS — I83218 Varicose veins of right lower extremity with both ulcer of other part of lower extremity and inflammation: Secondary | ICD-10-CM | POA: Diagnosis not present

## 2017-03-10 DIAGNOSIS — E1122 Type 2 diabetes mellitus with diabetic chronic kidney disease: Secondary | ICD-10-CM | POA: Diagnosis not present

## 2017-03-10 DIAGNOSIS — L97811 Non-pressure chronic ulcer of other part of right lower leg limited to breakdown of skin: Secondary | ICD-10-CM | POA: Diagnosis not present

## 2017-03-10 DIAGNOSIS — E669 Obesity, unspecified: Secondary | ICD-10-CM | POA: Diagnosis not present

## 2017-03-11 DIAGNOSIS — E1122 Type 2 diabetes mellitus with diabetic chronic kidney disease: Secondary | ICD-10-CM | POA: Diagnosis not present

## 2017-03-11 DIAGNOSIS — I83218 Varicose veins of right lower extremity with both ulcer of other part of lower extremity and inflammation: Secondary | ICD-10-CM | POA: Diagnosis not present

## 2017-03-11 DIAGNOSIS — E669 Obesity, unspecified: Secondary | ICD-10-CM | POA: Diagnosis not present

## 2017-03-11 DIAGNOSIS — L97811 Non-pressure chronic ulcer of other part of right lower leg limited to breakdown of skin: Secondary | ICD-10-CM | POA: Diagnosis not present

## 2017-03-11 DIAGNOSIS — N183 Chronic kidney disease, stage 3 (moderate): Secondary | ICD-10-CM | POA: Diagnosis not present

## 2017-03-11 DIAGNOSIS — I129 Hypertensive chronic kidney disease with stage 1 through stage 4 chronic kidney disease, or unspecified chronic kidney disease: Secondary | ICD-10-CM | POA: Diagnosis not present

## 2017-03-12 ENCOUNTER — Telehealth: Payer: Self-pay | Admitting: Physician Assistant

## 2017-03-12 DIAGNOSIS — I83218 Varicose veins of right lower extremity with both ulcer of other part of lower extremity and inflammation: Secondary | ICD-10-CM | POA: Diagnosis not present

## 2017-03-12 DIAGNOSIS — E669 Obesity, unspecified: Secondary | ICD-10-CM | POA: Diagnosis not present

## 2017-03-12 DIAGNOSIS — E1122 Type 2 diabetes mellitus with diabetic chronic kidney disease: Secondary | ICD-10-CM | POA: Diagnosis not present

## 2017-03-12 DIAGNOSIS — N183 Chronic kidney disease, stage 3 (moderate): Secondary | ICD-10-CM | POA: Diagnosis not present

## 2017-03-12 DIAGNOSIS — L97811 Non-pressure chronic ulcer of other part of right lower leg limited to breakdown of skin: Secondary | ICD-10-CM | POA: Diagnosis not present

## 2017-03-12 DIAGNOSIS — I129 Hypertensive chronic kidney disease with stage 1 through stage 4 chronic kidney disease, or unspecified chronic kidney disease: Secondary | ICD-10-CM | POA: Diagnosis not present

## 2017-03-12 MED ORDER — AMBULATORY NON FORMULARY MEDICATION: 99 refills | Status: DC

## 2017-03-12 NOTE — Addendum Note (Signed)
Addended by: Collie Siad on: 03/12/2017 01:32 PM   Modules accepted: Orders

## 2017-03-12 NOTE — Telephone Encounter (Signed)
Order placed

## 2017-03-12 NOTE — Telephone Encounter (Signed)
Please call [facility]. Pt declined services and we are aware.  I assume it is because she was unhappy with new dressing that were dry and irritating her skin. She would like unna boot. Can we see if can switch?

## 2017-03-12 NOTE — Telephone Encounter (Signed)
Spoke with Bonnie Clark at [facility], he is going to speak with the nursing supervisor and call clinic back with the best way to proceed.

## 2017-03-16 ENCOUNTER — Encounter: Payer: Self-pay | Admitting: Physician Assistant

## 2017-03-16 DIAGNOSIS — N183 Chronic kidney disease, stage 3 (moderate): Secondary | ICD-10-CM | POA: Diagnosis not present

## 2017-03-16 DIAGNOSIS — E669 Obesity, unspecified: Secondary | ICD-10-CM | POA: Diagnosis not present

## 2017-03-16 DIAGNOSIS — I129 Hypertensive chronic kidney disease with stage 1 through stage 4 chronic kidney disease, or unspecified chronic kidney disease: Secondary | ICD-10-CM | POA: Diagnosis not present

## 2017-03-16 DIAGNOSIS — I83218 Varicose veins of right lower extremity with both ulcer of other part of lower extremity and inflammation: Secondary | ICD-10-CM | POA: Diagnosis not present

## 2017-03-16 DIAGNOSIS — E1122 Type 2 diabetes mellitus with diabetic chronic kidney disease: Secondary | ICD-10-CM | POA: Diagnosis not present

## 2017-03-16 DIAGNOSIS — L97811 Non-pressure chronic ulcer of other part of right lower leg limited to breakdown of skin: Secondary | ICD-10-CM | POA: Diagnosis not present

## 2017-03-18 DIAGNOSIS — E669 Obesity, unspecified: Secondary | ICD-10-CM | POA: Diagnosis not present

## 2017-03-18 DIAGNOSIS — I83218 Varicose veins of right lower extremity with both ulcer of other part of lower extremity and inflammation: Secondary | ICD-10-CM | POA: Diagnosis not present

## 2017-03-18 DIAGNOSIS — E1122 Type 2 diabetes mellitus with diabetic chronic kidney disease: Secondary | ICD-10-CM | POA: Diagnosis not present

## 2017-03-18 DIAGNOSIS — I129 Hypertensive chronic kidney disease with stage 1 through stage 4 chronic kidney disease, or unspecified chronic kidney disease: Secondary | ICD-10-CM | POA: Diagnosis not present

## 2017-03-18 DIAGNOSIS — N183 Chronic kidney disease, stage 3 (moderate): Secondary | ICD-10-CM | POA: Diagnosis not present

## 2017-03-18 DIAGNOSIS — L97811 Non-pressure chronic ulcer of other part of right lower leg limited to breakdown of skin: Secondary | ICD-10-CM | POA: Diagnosis not present

## 2017-03-19 DIAGNOSIS — I83218 Varicose veins of right lower extremity with both ulcer of other part of lower extremity and inflammation: Secondary | ICD-10-CM | POA: Diagnosis not present

## 2017-03-19 DIAGNOSIS — L97811 Non-pressure chronic ulcer of other part of right lower leg limited to breakdown of skin: Secondary | ICD-10-CM | POA: Diagnosis not present

## 2017-03-19 DIAGNOSIS — N183 Chronic kidney disease, stage 3 (moderate): Secondary | ICD-10-CM | POA: Diagnosis not present

## 2017-03-19 DIAGNOSIS — E1122 Type 2 diabetes mellitus with diabetic chronic kidney disease: Secondary | ICD-10-CM | POA: Diagnosis not present

## 2017-03-19 DIAGNOSIS — E669 Obesity, unspecified: Secondary | ICD-10-CM | POA: Diagnosis not present

## 2017-03-19 DIAGNOSIS — I129 Hypertensive chronic kidney disease with stage 1 through stage 4 chronic kidney disease, or unspecified chronic kidney disease: Secondary | ICD-10-CM | POA: Diagnosis not present

## 2017-03-23 DIAGNOSIS — N183 Chronic kidney disease, stage 3 (moderate): Secondary | ICD-10-CM | POA: Diagnosis not present

## 2017-03-23 DIAGNOSIS — I83218 Varicose veins of right lower extremity with both ulcer of other part of lower extremity and inflammation: Secondary | ICD-10-CM | POA: Diagnosis not present

## 2017-03-23 DIAGNOSIS — E669 Obesity, unspecified: Secondary | ICD-10-CM | POA: Diagnosis not present

## 2017-03-23 DIAGNOSIS — I129 Hypertensive chronic kidney disease with stage 1 through stage 4 chronic kidney disease, or unspecified chronic kidney disease: Secondary | ICD-10-CM | POA: Diagnosis not present

## 2017-03-23 DIAGNOSIS — L97811 Non-pressure chronic ulcer of other part of right lower leg limited to breakdown of skin: Secondary | ICD-10-CM | POA: Diagnosis not present

## 2017-03-23 DIAGNOSIS — E1122 Type 2 diabetes mellitus with diabetic chronic kidney disease: Secondary | ICD-10-CM | POA: Diagnosis not present

## 2017-03-26 DIAGNOSIS — E669 Obesity, unspecified: Secondary | ICD-10-CM | POA: Diagnosis not present

## 2017-03-26 DIAGNOSIS — I129 Hypertensive chronic kidney disease with stage 1 through stage 4 chronic kidney disease, or unspecified chronic kidney disease: Secondary | ICD-10-CM | POA: Diagnosis not present

## 2017-03-26 DIAGNOSIS — I83218 Varicose veins of right lower extremity with both ulcer of other part of lower extremity and inflammation: Secondary | ICD-10-CM | POA: Diagnosis not present

## 2017-03-26 DIAGNOSIS — L97811 Non-pressure chronic ulcer of other part of right lower leg limited to breakdown of skin: Secondary | ICD-10-CM | POA: Diagnosis not present

## 2017-03-26 DIAGNOSIS — E1122 Type 2 diabetes mellitus with diabetic chronic kidney disease: Secondary | ICD-10-CM | POA: Diagnosis not present

## 2017-03-26 DIAGNOSIS — N183 Chronic kidney disease, stage 3 (moderate): Secondary | ICD-10-CM | POA: Diagnosis not present

## 2017-03-30 ENCOUNTER — Ambulatory Visit (INDEPENDENT_AMBULATORY_CARE_PROVIDER_SITE_OTHER): Payer: Medicare Other | Admitting: Pulmonary Disease

## 2017-03-30 ENCOUNTER — Other Ambulatory Visit: Payer: Self-pay | Admitting: Physician Assistant

## 2017-03-30 ENCOUNTER — Encounter: Payer: Self-pay | Admitting: Pulmonary Disease

## 2017-03-30 DIAGNOSIS — G4710 Hypersomnia, unspecified: Secondary | ICD-10-CM | POA: Diagnosis not present

## 2017-03-30 DIAGNOSIS — F5104 Psychophysiologic insomnia: Secondary | ICD-10-CM

## 2017-03-30 NOTE — Progress Notes (Signed)
Subjective:    Patient ID: Bonnie Clark, female    DOB: 10-09-1941, 76 y.o.   MRN: [id number]  HPI  Chief Complaint  Patient presents with  . Sleep Consult    Referred by Dr. Jens Som for snoring. Patient states she was able to sleep while in [place]. Once moving to [place], she has not been able to sleep at night. This has been going on for about 5 years. Has never had a sleep study done.    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 She has hypertension diabetes and atrial fibrillation maintained on beta blocker and Pradaxa. She is wheelchair-bound due to a bad back bilateral knee replacements and a frozen hip and spinal stenosis, can and leg with walker for short distances. She lived in [place] until she moved down to not to be closer to her son's about 5 years ago. 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  She is unable to get into her bed and sleeps in a recliner with her dog Epworth sleepiness score is.  Bedtime is around 10:30 PM which she said she cannot sleep well at night at all, is often 1 AM or later before she falls asleep and then has frequent nocturnal awakenings. She wakes up early in the morning and has breakfast with her husband and her best sleep is between 9 AM to 12 noon when she is napping in a recliner. Soft snoring has been noted by her husband but he has not witnessed apneas. She reports dry mouth and occasional headaches, reports non-refreshing sleep  There is no history suggestive of cataplexy, sleep paralysis or parasomnias  She is a lifetime never smoker. She reports claustrophobia and does not want to go to the sleep lab at all to get tested  10/2016 bicarbonate was 26     Past Medical History:  Diagnosis Date  . Anxiety and depression 09/11/2011  . Arthritis   . Atrial fibrillation (HCC)   . Chronic insomnia 09/11/2011  . Diabetes mellitus   . Fibromyalgia 09/11/2011  . Gait disorder  12/21/2011  . Gout 09/11/2011  . Hyperlipidemia   . Hypertension   . OAB (overactive bladder) 06/21/2012  . Obesity 09/11/2011  . Peripheral edema 06/21/2012  . Sciatica of right side 09/11/2011  . Spinal stenosis 09/11/2011  . Thyroid disease   . Urine incontinence    Past Surgical History:  Procedure Laterality Date  . REPLACEMENT TOTAL KNEE BILATERAL     right approx 1994, left approx 1999  . SHOULDER SURGERY     right - approx 2005  . THYROIDECTOMY, PARTIAL     at 76yo    Allergies  Allergen Reactions  . Glipizide     diarrhea  . Voltaren [Diclofenac Sodium]     Rash    Social History   Social History  . Marital status: Married    Spouse name: N/A  . Number of children: 3  . Years of education: N/A   Occupational History  . Not on file.   Social History Main Topics  . Smoking status: Never Smoker  . Smokeless tobacco: Never Used  . Alcohol use No  . Drug use: No  . Sexual activity: Not on file   Other Topics Concern  . Not on file   Social History Narrative  . No narrative on file     Family History  Problem Relation Age of Onset  .  Arthritis Mother   . Depression Mother   . Arthritis Father   . Stroke Sister       Review of Systems  Positive for chronic leg swelling, dyspnea and exertion, knee pains, gait problems and coordination issues  Constitutional: negative for anorexia, fevers and sweats  Eyes: negative for irritation, redness and visual disturbance  Ears, nose, mouth, throat, and face: negative for earaches, epistaxis, nasal congestion and sore throat  Respiratory: negative for cough, sputum and wheezing  Cardiovascular: negative for chest pain,  palpitations and syncope  Gastrointestinal: negative for abdominal pain, constipation, diarrhea, melena, nausea and vomiting  Genitourinary:negative for dysuria, frequency and hematuria  Hematologic/lymphatic: negative for bleeding, easy bruising and lymphadenopathy    Musculoskeletal:negative for arthralgias, muscle weakness and stiff joints  Neurological: negative for  headaches and weakness  Endocrine: negative for diabetic symptoms including polydipsia, polyuria and weight loss     Objective:   Physical Exam   Gen. Pleasant, obese, in no distress, normal affect ENT - no lesions, no post nasal drip, class 2-3 airway Neck: No JVD, no thyromegaly, no carotid bruits Lungs: no use of accessory muscles, no dullness to percussion, decreased without rales or rhonchi  Cardiovascular: Rhythm regular, heart sounds  normal, no murmurs or gallops, 2+ peripheral edema Abdomen: soft and non-tender, no hepatosplenomegaly, BS normal. Musculoskeletal: No deformities, no cyanosis or clubbing Neuro:  alert, non focal, no tremors        Assessment & Plan:

## 2017-03-30 NOTE — Patient Instructions (Signed)
Home sleep study will be scheduled Okay to sleep in a recliner -okay to perform the study in the morning  Based on this we will decide whether you need a CPAP machine We will try to avoid visit to the sleep center

## 2017-03-30 NOTE — Assessment & Plan Note (Signed)
Home sleep study will be scheduled Pretest probability is high, she may be self treating somewhat by sleeping upright  in a recliner Okay to sleep in a recliner -okay to perform the study in the morning  Based on this we will decide whether she need a CPAP machine We will try to avoid visit to the sleep center - may do a mask fitting session (she does go to the wound center frequently) followed by auto CPAP  Given excessive daytime somnolence, narrow pharyngeal exam, witnessed apneas & loud snoring, obstructive sleep apnea is very likely & an overnight polysomnogram will be scheduled as a home study. The pathophysiology of obstructive sleep apnea , it's cardiovascular consequences & modes of treatment including CPAP were discused with the patient in detail & they evidenced understanding.

## 2017-03-30 NOTE — Assessment & Plan Note (Signed)
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

## 2017-03-31 DIAGNOSIS — E669 Obesity, unspecified: Secondary | ICD-10-CM | POA: Diagnosis not present

## 2017-03-31 DIAGNOSIS — E1122 Type 2 diabetes mellitus with diabetic chronic kidney disease: Secondary | ICD-10-CM | POA: Diagnosis not present

## 2017-03-31 DIAGNOSIS — I129 Hypertensive chronic kidney disease with stage 1 through stage 4 chronic kidney disease, or unspecified chronic kidney disease: Secondary | ICD-10-CM | POA: Diagnosis not present

## 2017-03-31 DIAGNOSIS — I83218 Varicose veins of right lower extremity with both ulcer of other part of lower extremity and inflammation: Secondary | ICD-10-CM | POA: Diagnosis not present

## 2017-03-31 DIAGNOSIS — L97811 Non-pressure chronic ulcer of other part of right lower leg limited to breakdown of skin: Secondary | ICD-10-CM | POA: Diagnosis not present

## 2017-03-31 DIAGNOSIS — N183 Chronic kidney disease, stage 3 (moderate): Secondary | ICD-10-CM | POA: Diagnosis not present

## 2017-04-04 ENCOUNTER — Other Ambulatory Visit: Payer: Self-pay | Admitting: Physician Assistant

## 2017-04-04 DIAGNOSIS — N183 Chronic kidney disease, stage 3 (moderate): Principal | ICD-10-CM

## 2017-04-04 DIAGNOSIS — E1122 Type 2 diabetes mellitus with diabetic chronic kidney disease: Secondary | ICD-10-CM

## 2017-04-08 DIAGNOSIS — L97811 Non-pressure chronic ulcer of other part of right lower leg limited to breakdown of skin: Secondary | ICD-10-CM | POA: Diagnosis not present

## 2017-04-08 DIAGNOSIS — N183 Chronic kidney disease, stage 3 (moderate): Secondary | ICD-10-CM | POA: Diagnosis not present

## 2017-04-08 DIAGNOSIS — E669 Obesity, unspecified: Secondary | ICD-10-CM | POA: Diagnosis not present

## 2017-04-08 DIAGNOSIS — I83218 Varicose veins of right lower extremity with both ulcer of other part of lower extremity and inflammation: Secondary | ICD-10-CM | POA: Diagnosis not present

## 2017-04-08 DIAGNOSIS — E1122 Type 2 diabetes mellitus with diabetic chronic kidney disease: Secondary | ICD-10-CM | POA: Diagnosis not present

## 2017-04-08 DIAGNOSIS — I129 Hypertensive chronic kidney disease with stage 1 through stage 4 chronic kidney disease, or unspecified chronic kidney disease: Secondary | ICD-10-CM | POA: Diagnosis not present

## 2017-04-15 ENCOUNTER — Ambulatory Visit: Payer: Medicare Other | Admitting: Physician Assistant

## 2017-04-15 DIAGNOSIS — I129 Hypertensive chronic kidney disease with stage 1 through stage 4 chronic kidney disease, or unspecified chronic kidney disease: Secondary | ICD-10-CM | POA: Diagnosis not present

## 2017-04-15 DIAGNOSIS — L97811 Non-pressure chronic ulcer of other part of right lower leg limited to breakdown of skin: Secondary | ICD-10-CM | POA: Diagnosis not present

## 2017-04-15 DIAGNOSIS — N183 Chronic kidney disease, stage 3 (moderate): Secondary | ICD-10-CM | POA: Diagnosis not present

## 2017-04-15 DIAGNOSIS — I83218 Varicose veins of right lower extremity with both ulcer of other part of lower extremity and inflammation: Secondary | ICD-10-CM | POA: Diagnosis not present

## 2017-04-15 DIAGNOSIS — E669 Obesity, unspecified: Secondary | ICD-10-CM | POA: Diagnosis not present

## 2017-04-15 DIAGNOSIS — E1122 Type 2 diabetes mellitus with diabetic chronic kidney disease: Secondary | ICD-10-CM | POA: Diagnosis not present

## 2017-04-20 ENCOUNTER — Other Ambulatory Visit: Payer: Self-pay | Admitting: Cardiology

## 2017-05-01 ENCOUNTER — Other Ambulatory Visit: Payer: Self-pay | Admitting: Physician Assistant

## 2017-05-12 ENCOUNTER — Ambulatory Visit: Payer: Medicare Other | Admitting: Physician Assistant

## 2017-05-12 ENCOUNTER — Other Ambulatory Visit: Payer: Self-pay | Admitting: Cardiology

## 2017-05-13 ENCOUNTER — Encounter: Payer: Self-pay | Admitting: Physician Assistant

## 2017-05-13 ENCOUNTER — Ambulatory Visit (INDEPENDENT_AMBULATORY_CARE_PROVIDER_SITE_OTHER): Payer: Medicare Other | Admitting: Physician Assistant

## 2017-05-13 VITALS — BP 130/77 | HR 84

## 2017-05-13 DIAGNOSIS — L97221 Non-pressure chronic ulcer of left calf limited to breakdown of skin: Secondary | ICD-10-CM | POA: Diagnosis not present

## 2017-05-13 DIAGNOSIS — L03119 Cellulitis of unspecified part of limb: Secondary | ICD-10-CM | POA: Insufficient documentation

## 2017-05-13 DIAGNOSIS — I83022 Varicose veins of left lower extremity with ulcer of calf: Secondary | ICD-10-CM

## 2017-05-13 DIAGNOSIS — R600 Localized edema: Secondary | ICD-10-CM

## 2017-05-13 DIAGNOSIS — I872 Venous insufficiency (chronic) (peripheral): Secondary | ICD-10-CM | POA: Diagnosis not present

## 2017-05-13 MED ORDER — CEPHALEXIN 500 MG PO CAPS: 500.0000 mg | ORAL_CAPSULE | Freq: Two times a day (BID) | ORAL | 0 refills | Status: DC

## 2017-05-13 MED ORDER — TORSEMIDE 10 MG PO TABS: 10.0000 mg | ORAL_TABLET | Freq: Every day | ORAL | 1 refills | Status: DC

## 2017-05-13 MED ORDER — FUROSEMIDE 10 MG/ML IJ SOLN
40.0000 mg | Freq: Once | INTRAMUSCULAR | Status: AC
  Administered 2017-05-13: 40 mg via INTRAMUSCULAR

## 2017-05-13 NOTE — Progress Notes (Signed)
Subjective:    Patient ID: Bonnie Clark, female    DOB: 20-Aug-1941, 76 y.o.   MRN: [id number]  HPI  Pt is a morbidly obese 76 yo female with hx of venous stasis dermatitis, lower extremity edema and cellulitis of bilateral legs. [facility] released her a few weeks ago and she has been great for 1 month. About 2-3 days ago bilateral leg swelling began to increase and then get red and hot. 1 day ago ulcers began to drain on her anterior left leg. Increasing pain. She continues to take lasix daily but feels like not helping as well as it once did with swelling. No fever, chills, SOB.   She does note she had to stop mrybetriq due to feelings of not being able to get her breath. Resolved once she stopped medications.   .. Active Ambulatory Problems    Diagnosis Date Noted  . Depression 09/11/2011  . Arthritis   . Type 2 diabetes mellitus (HCC)   . Hyperlipidemia   . Hypertension   . Thyroid disease   . Morbid obesity (HCC) 09/11/2011  . Chronic insomnia 09/11/2011  . Chronic pain 09/11/2011  . Fibromyalgia 09/11/2011  . Anxiety and depression 09/11/2011  . Fatigue 09/11/2011  . Gout 09/11/2011  . Gait disorder 12/21/2011  . Atrial fibrillation 03/19/2012  . Cardiomyopathy (HCC) 04/21/2012  . Peripheral edema 06/21/2012  . OAB (overactive bladder) 06/21/2012  . Hypersomnolence 06/21/2012  . Other malaise and fatigue 02/14/2013  . Pre-ulcerative corn or callous 02/14/2013  . Abscess of heel, left 02/14/2013  . Osteoarthritis of both hips 10/11/2013  . Lumbar degenerative disc disease 10/11/2013  . Renal insufficiency 03/15/2014  . Venous stasis dermatitis of both lower extremities 04/03/2014  . Physical deconditioning 05/31/2014  . Ganglion cyst 03/12/2015  . Pressure ulcer stage II 03/12/2015  . Olecranon bursitis of left elbow 06/29/2015  . Venous stasis ulcer of thigh limited to breakdown of skin with varicose veins (HCC) 12/16/2016  . Balance problems 03/06/2017  . Weakness of  lower extremity 03/06/2017  . Cellulitis of lower extremity 05/13/2017  . Lower leg edema 05/13/2017   Resolved Ambulatory Problems    Diagnosis Date Noted  . Spinal stenosis 09/11/2011  . Sciatica of right side 09/11/2011  . Chest pain 03/05/2012  . Urinary incontinence 03/05/2012  . Osteoarthritis of right hip 01/30/2014   Past Medical History:  Diagnosis Date  . Anxiety and depression 09/11/2011  . Arthritis   . Atrial fibrillation (HCC)   . Chronic insomnia 09/11/2011  . Diabetes mellitus   . Fibromyalgia 09/11/2011  . Gait disorder 12/21/2011  . Gout 09/11/2011  . Hyperlipidemia   . Hypertension   . OAB (overactive bladder) 06/21/2012  . Obesity 09/11/2011  . Peripheral edema 06/21/2012  . Sciatica of right side 09/11/2011  . Spinal stenosis 09/11/2011  . Thyroid disease   . Urine incontinence      Review of Systems  All other systems reviewed and are negative.      Objective:   Physical Exam  Constitutional:  Morbidly obese.   Cardiovascular: Normal rate.   Pulmonary/Chest: Effort normal and breath sounds normal. She has no wheezes.  Skin:  Bilateral anterior calves with large surface area(15cm by 20cm) of raised erythema that is warm and tender to touch with significant pitting edema and left leg with multiple ulcers that are actively draining.           Assessment & Plan:  [name] KitchenMarland KitchenDiagnoses and all orders for this visit:  Venous stasis ulcer of left calf limited to breakdown of skin with varicose veins (HCC) -     torsemide (DEMADEX) 10 MG tablet; Take 1 tablet (10 mg total) by mouth daily. -     Ambulatory referral to Home Health  Cellulitis of lower extremity, unspecified laterality -     cephALEXin (KEFLEX) 500 MG capsule; Take 1 capsule (500 mg total) by mouth 2 (two) times daily. For 10 days. -     Ambulatory referral to Home Health  Lower leg edema -     torsemide (DEMADEX) 10 MG tablet; Take 1 tablet (10 mg total) by mouth daily. -      furosemide (LASIX) injection 40 mg; Inject 4 mLs (40 mg total) into the muscle once. -     Ambulatory referral to Home Health  Venous stasis dermatitis of both lower extremities -     torsemide (DEMADEX) 10 MG tablet; Take 1 tablet (10 mg total) by mouth daily. -     Ambulatory referral to Home Health   IM injection of lasix given today.  Wrapped in bilateral unna boot for next 3-4 days until home health can come back out to wrap.  Stop lasix orally. Try demadex.  Start keflex for infection.  Keep legs elevated.  Follow up in 2 weeks.

## 2017-05-15 ENCOUNTER — Telehealth: Payer: Self-pay | Admitting: *Deleted

## 2017-05-15 DIAGNOSIS — E1122 Type 2 diabetes mellitus with diabetic chronic kidney disease: Secondary | ICD-10-CM | POA: Diagnosis not present

## 2017-05-15 DIAGNOSIS — L97811 Non-pressure chronic ulcer of other part of right lower leg limited to breakdown of skin: Secondary | ICD-10-CM | POA: Diagnosis not present

## 2017-05-15 DIAGNOSIS — I83218 Varicose veins of right lower extremity with both ulcer of other part of lower extremity and inflammation: Secondary | ICD-10-CM | POA: Diagnosis not present

## 2017-05-15 DIAGNOSIS — I129 Hypertensive chronic kidney disease with stage 1 through stage 4 chronic kidney disease, or unspecified chronic kidney disease: Secondary | ICD-10-CM | POA: Diagnosis not present

## 2017-05-15 DIAGNOSIS — N183 Chronic kidney disease, stage 3 (moderate): Secondary | ICD-10-CM | POA: Diagnosis not present

## 2017-05-15 DIAGNOSIS — L03116 Cellulitis of left lower limb: Secondary | ICD-10-CM | POA: Diagnosis not present

## 2017-05-15 NOTE — Telephone Encounter (Signed)
Gave VO for PT and venous statis care .Loralee [place]

## 2017-05-21 DIAGNOSIS — L03116 Cellulitis of left lower limb: Secondary | ICD-10-CM | POA: Diagnosis not present

## 2017-05-21 DIAGNOSIS — I83218 Varicose veins of right lower extremity with both ulcer of other part of lower extremity and inflammation: Secondary | ICD-10-CM | POA: Diagnosis not present

## 2017-05-21 DIAGNOSIS — L97811 Non-pressure chronic ulcer of other part of right lower leg limited to breakdown of skin: Secondary | ICD-10-CM | POA: Diagnosis not present

## 2017-05-21 DIAGNOSIS — N183 Chronic kidney disease, stage 3 (moderate): Secondary | ICD-10-CM | POA: Diagnosis not present

## 2017-05-21 DIAGNOSIS — E1122 Type 2 diabetes mellitus with diabetic chronic kidney disease: Secondary | ICD-10-CM | POA: Diagnosis not present

## 2017-05-21 DIAGNOSIS — I129 Hypertensive chronic kidney disease with stage 1 through stage 4 chronic kidney disease, or unspecified chronic kidney disease: Secondary | ICD-10-CM | POA: Diagnosis not present

## 2017-05-26 ENCOUNTER — Ambulatory Visit: Payer: Medicare Other | Admitting: Sports Medicine

## 2017-05-26 ENCOUNTER — Other Ambulatory Visit: Payer: Self-pay | Admitting: Physician Assistant

## 2017-05-26 ENCOUNTER — Other Ambulatory Visit: Payer: Self-pay | Admitting: *Deleted

## 2017-05-26 MED ORDER — METOPROLOL TARTRATE 100 MG PO TABS: 100.0000 mg | ORAL_TABLET | Freq: Two times a day (BID) | ORAL | 1 refills | Status: DC

## 2017-05-28 ENCOUNTER — Encounter: Payer: Self-pay | Admitting: Family Medicine

## 2017-05-28 ENCOUNTER — Ambulatory Visit (INDEPENDENT_AMBULATORY_CARE_PROVIDER_SITE_OTHER): Payer: Medicare Other | Admitting: Family Medicine

## 2017-05-28 VITALS — BP 105/58 | HR 65 | Resp 18

## 2017-05-28 DIAGNOSIS — L03119 Cellulitis of unspecified part of limb: Secondary | ICD-10-CM | POA: Diagnosis not present

## 2017-05-28 DIAGNOSIS — L97221 Non-pressure chronic ulcer of left calf limited to breakdown of skin: Secondary | ICD-10-CM | POA: Diagnosis not present

## 2017-05-28 DIAGNOSIS — I83022 Varicose veins of left lower extremity with ulcer of calf: Secondary | ICD-10-CM | POA: Diagnosis not present

## 2017-05-28 DIAGNOSIS — R600 Localized edema: Secondary | ICD-10-CM

## 2017-05-28 NOTE — Progress Notes (Signed)
   Subjective:    Patient ID: Bonnie Clark, female    DOB: 1941-05-28, 76 y.o.   MRN: [id number]  HPI 76 year old female with a history of venous stasis dermatitis and morbid obesity comes in for 2 week follow-up of venous stasis ulcer. She was seen on June 20 by her PCP, Bonnie Clark for more acute swelling and redness of the lower extremities. At that time the ulcer had been draining. She was given an IM injection of Lasix that day and wrapped in bilateral Unna boots for the next 3-4 days. Home health was ordered to come out and do wound care changes. She was told to stop the Lasix and try Demadex instead and was started on Keflex for cellulitis of the lower extremities. She reports overall her swelling is better.  Unfortunately she has not had consistency with the Unna boots through home health. She said at one point they put a Bonnie Clark on top and it was actually too tight and she thinks she may have actually had a reaction to the command as she had almost well like marks on her legs. Then she had someone else who out to him to loosely and literally within a couple hours they had shifted down and fall onto her ankles.  She's had recurrent problems in the past with low pressure me swelling and skin breakdown.   Review of Systems       Objective:   Physical Exam  Constitutional: She is oriented to person, place, and time. She appears well-developed and well-nourished.  HENT:  Head: Normocephalic and atraumatic.  Eyes: Conjunctivae and EOM are normal.  Cardiovascular: Normal rate.   Pulmonary/Chest: Effort normal.  Neurological: She is alert and oriented to person, place, and time.  Skin: Skin is dry. No pallor.  Skin looks slightly erythematous MSK more of a dusky appearance versus a bright red over both lower extremities with some thick scale and crusting. No open wound or drainage. No blisters on exam today. 2+ pitting edema of both lower extremities.  Psychiatric: She has a normal mood and  affect. Her behavior is normal.  Vitals reviewed.       Assessment & Plan:  Venous stasis ulcer of left calf limited to breakdown of skin with varicose veins- We'll be happy to do her unna boots here if she would prefer to do that instead of having home health to do it. We did go ahead and wrap them today. She's cannot let them wrap them again in 3-4 days and see if they do a good job. If not and she can see [place] back for the ongoing wrap. It also turns out that she had been using a lot of Neosporin on her legs before this happened. I explained that Neosporin should never be used more than 3 days consecutively as it can cause a contact dermatitis. Once she completes the Unna boots recommend just using petroleum jelly or even utter balm instead of Neosporin. I did explained her that moisturizing the skin is extremely important to avoid cracking and breaks.  Cellulitis of lower extremity - completed antibiotic.  Add does look like the cellulitis has cleared up.  LE edema edema- he is doing better on the Demadex. We were unable to weigh her today but she feels like her lower extremity swelling is improved. She still has some pitting edema 2+ in both legs.

## 2017-06-04 ENCOUNTER — Ambulatory Visit (INDEPENDENT_AMBULATORY_CARE_PROVIDER_SITE_OTHER): Payer: Medicare Other | Admitting: Family Medicine

## 2017-06-04 ENCOUNTER — Encounter: Payer: Self-pay | Admitting: Family Medicine

## 2017-06-04 VITALS — BP 125/68 | HR 57

## 2017-06-04 DIAGNOSIS — I83022 Varicose veins of left lower extremity with ulcer of calf: Secondary | ICD-10-CM

## 2017-06-04 DIAGNOSIS — M797 Fibromyalgia: Secondary | ICD-10-CM | POA: Diagnosis not present

## 2017-06-04 DIAGNOSIS — L97221 Non-pressure chronic ulcer of left calf limited to breakdown of skin: Secondary | ICD-10-CM

## 2017-06-04 DIAGNOSIS — M5412 Radiculopathy, cervical region: Secondary | ICD-10-CM | POA: Diagnosis not present

## 2017-06-04 DIAGNOSIS — G8929 Other chronic pain: Secondary | ICD-10-CM

## 2017-06-04 DIAGNOSIS — M25512 Pain in left shoulder: Secondary | ICD-10-CM | POA: Diagnosis not present

## 2017-06-04 MED ORDER — CYCLOBENZAPRINE HCL 5 MG PO TABS: 5.0000 mg | ORAL_TABLET | Freq: Every evening | ORAL | 0 refills | Status: DC | PRN

## 2017-06-04 NOTE — Progress Notes (Signed)
Subjective:    Patient ID: Bonnie Clark, female    DOB: November 15, 1941, 76 y.o.   MRN: [id number]  HPI Venous venous stasis ulcer the left calf- she left the Unna boots on for about 6 days until yesterday. Was starting to hurt and she fell at the calamine was starting to hurt and so she took it off. She says now there is a blister over the right upper or should've the lower leg. This happened similar to when they were wrapped at home health and she developed a blister on the lower part of her leg.They also got a prescription for her udder balm and she says this really has actually been very soothing.  Fibromyalgia - She also wanted to discuss her fibromyalgia today. She says that she's had that diagnosis for at least 10 or 15 years and says it has never been separately addressed from her other chronic medical problems. She's never really taking medication specifically for that issue. She said years ago she did have problems with her shoulder and up having injections and then eventual surgery. Now expressing significant pain in her left shoulder. She was having some problems back then but her right was the most problematic.  She complains of pain that starts at her neck and radiates down her left shoulder all the way down to her outer arm past her elbow. It does not go into the hand. It reminds her of some the problem she was having with her right shoulder previously.  Review of Systems   BP 125/68   Pulse (!) 57     Allergies  Allergen Reactions  . Glipizide     diarrhea  . Myrbetriq [Mirabegron]     Could not get a deep breath.   . Voltaren [Diclofenac Sodium]     Rash    Past Medical History:  Diagnosis Date  . Anxiety and depression 09/11/2011  . Arthritis   . Atrial fibrillation (HCC)   . Chronic insomnia 09/11/2011  . Diabetes mellitus   . Fibromyalgia 09/11/2011  . Gait disorder 12/21/2011  . Gout 09/11/2011  . Hyperlipidemia   . Hypertension   . OAB (overactive bladder)  06/21/2012  . Obesity 09/11/2011  . Peripheral edema 06/21/2012  . Sciatica of right side 09/11/2011  . Spinal stenosis 09/11/2011  . Thyroid disease   . Urine incontinence     Past Surgical History:  Procedure Laterality Date  . REPLACEMENT TOTAL KNEE BILATERAL     right approx 1994, left approx 1999  . SHOULDER SURGERY     right - approx 2005  . THYROIDECTOMY, PARTIAL     at 76yo    Social History   Social History  . Marital status: Married    Spouse name: N/A  . Number of children: 3  . Years of education: N/A   Occupational History  . Not on file.   Social History Main Topics  . Smoking status: Never Smoker  . Smokeless tobacco: Never Used  . Alcohol use No  . Drug use: No  . Sexual activity: Not on file   Other Topics Concern  . Not on file   Social History Narrative  . No narrative on file    Family History  Problem Relation Age of Onset  . Arthritis Mother   . Depression Mother   . Arthritis Father   . Stroke Sister     Outpatient Encounter Prescriptions as of 06/04/2017  Medication Sig  . allopurinol (ZYLOPRIM) 300 MG tablet  Take 1 tablet (300 mg total) by mouth daily. Due for follow up appointment in August  . AMBULATORY NON FORMULARY MEDICATION Home health orders for unna boot application twice a week for treatment of chronic venous stasis for next 6 months. Follow up in office every 6 months.  . AMBULATORY NON FORMULARY MEDICATION Need for one power wheelchair due to balance instability, lower extremity weakness, and lumbar DDD.  DX: R26.89, R29.898, M51.36  . AMBULATORY NON FORMULARY MEDICATION [facility] orders for 3 layer unna boot with calamine application twice a week for treatment of bilateral chronic venous stasis for next 6 months. Follow up in office every 6 months.  [name] Kitchen atorvastatin (LIPITOR) 40 MG tablet Take 1 tablet (40 mg total) by mouth daily. Needs lab work for future refills.  . Bismuth Tribromoph-Petrolatum (XEROFORM  PETROLAT PATCH 4"X4") PADS Apply to irritated skin daily, replace daily as needed.  . clobetasol ointment (TEMOVATE) 0.05 % Apply 1 application topically 2 (two) times daily. (Patient taking differently: Apply 1 application topically as directed. )  . clonazePAM (KLONOPIN) 1 MG tablet TAKE ONE-HALF TO ONE TABLET BY MOUTH THREE TIMES DAILY AS NEEDED FOR ANXIETY AND  SLEEP  **NEEDS  ANXIETY  FOLLOW  UP  APPOINTMENT**  . dabigatran (PRADAXA) 150 MG CAPS capsule Take 1 capsule (150 mg total) by mouth 2 (two) times daily.  . DULoxetine (CYMBALTA) 60 MG capsule TAKE ONE CAPSULE BY MOUTH ONCE DAILY  . glimepiride (AMARYL) 4 MG tablet TAKE ONE TABLET BY MOUTH TWICE DAILY WITH MEALS  . glucose blood (ONE TOUCH ULTRA TEST) test strip Use to test blood sugar 2 times daily as instructed. Dx code: 16.02  . HYDROcodone-acetaminophen (NORCO/VICODIN) 5-325 MG tablet Take 1-2 tablets by mouth every 8 (eight) hours as needed for moderate pain.  [name] Kitchen lisinopril (PRINIVIL,ZESTRIL) 2.5 MG tablet TAKE 1 TABLET BY MOUTH ONCE DAILY  . metoprolol tartrate (LOPRESSOR) 100 MG tablet Take 1 tablet (100 mg total) by mouth 2 (two) times daily.  . Saxagliptin-Metformin (KOMBIGLYZE XR) 2.03-999 MG TB24 Take 2 tablets by mouth at dinner time daily.  [name] Kitchen torsemide (DEMADEX) 10 MG tablet Take 1 tablet (10 mg total) by mouth daily.  . cyclobenzaprine (FLEXERIL) 5 MG tablet Take 1 tablet (5 mg total) by mouth at bedtime as needed for muscle spasms.  . [DISCONTINUED] cephALEXin (KEFLEX) 500 MG capsule Take 1 capsule (500 mg total) by mouth 2 (two) times daily. For 10 days.   No facility-administered encounter medications on file as of 06/04/2017.          Objective:   Physical Exam  Constitutional: She is oriented to person, place, and time. She appears well-developed and well-nourished.  HENT:  Head: Normocephalic and atraumatic.  Eyes: Conjunctivae and EOM are normal.  Cardiovascular: Normal rate.   Pulmonary/Chest: Effort  normal.  Neurological: She is alert and oriented to person, place, and time.  Skin: Skin is dry. No pallor.  Still some erythema of the floor [place] bilaterally but is fairly symmetric. There is a little bit of thick scale and a few spots on the legs but overall looks much more moisturized and smooth. Along the top where the top of the wrapping was she does have a linear horizontal line with some blistering. She has 1+ pitting edema from the lower straight onto her feet.  Psychiatric: She has a normal mood and affect. Her behavior is normal.  Vitals reviewed.       Assessment & Plan:  Venous stasis ulcers  of the left and right lower legs-she is actually doing much better. Overall her legs look better except for the fact that she does have new blisters right with the top of the bandages ended. I think that was probably from leaving them on a little too long so like her to have come back in 5-6 days instead of 7 days. She says they actually felt great and divided a lot of support for her legs. I don't think we need a new antibiotic at this point in time.  Fibromyalgia-one of her biggest issues is actually poor sleep quality. She says she is constantly having to get up and move throughout the night. Recommend trial of Flexeril 0.5, up to 5 mg at bedtime. Explained this can help relax her and help her sleep a little better. But is not as sedating as an actual sleep aid. I like for her to try this for one month and see if it's helpful.  Left shoulder pain/left neck pain-suspect that some of her pain is probably from degenerative disc disease of the lower cervical spine but she may also have a separate shoulder issue. I did encourage her to get in with one of our sports medicine provider's today for further evaluation and discuss treatment options.

## 2017-06-08 ENCOUNTER — Ambulatory Visit (INDEPENDENT_AMBULATORY_CARE_PROVIDER_SITE_OTHER): Payer: Medicare Other | Admitting: Sports Medicine

## 2017-06-08 ENCOUNTER — Encounter: Payer: Self-pay | Admitting: Sports Medicine

## 2017-06-08 ENCOUNTER — Ambulatory Visit (INDEPENDENT_AMBULATORY_CARE_PROVIDER_SITE_OTHER): Payer: Medicare Other

## 2017-06-08 DIAGNOSIS — M25512 Pain in left shoulder: Secondary | ICD-10-CM | POA: Diagnosis not present

## 2017-06-08 DIAGNOSIS — M19012 Primary osteoarthritis, left shoulder: Secondary | ICD-10-CM

## 2017-06-08 DIAGNOSIS — G8929 Other chronic pain: Secondary | ICD-10-CM

## 2017-06-08 DIAGNOSIS — R2232 Localized swelling, mass and lump, left upper limb: Secondary | ICD-10-CM | POA: Insufficient documentation

## 2017-06-08 NOTE — Assessment & Plan Note (Signed)
Injection, x-rays, home health physical therapy.  She is post right shoulder arthroplasty. Return in 6 weeks.

## 2017-06-08 NOTE — Assessment & Plan Note (Signed)
Return for surgical removal, 30 minute slot.

## 2017-06-08 NOTE — Progress Notes (Signed)
   Subjective:    I'm seeing this patient as a consultation for:  Bonnie Gaw, PA-C  CC: Left shoulder pain  HPI: Jillisa is a 76 year old female, she has a history of a right total shoulder arthroplasty, for decades she's had pain in her left shoulder, worse over the deltoid, and with most movements, she was told by her orthopedic surgeon that she needed a left shoulder replacement as well. Pain is moderate, persistent, localized without radiation most of the time, occasionally she does have some pain going down into her hands.  She also has a mass on her left forearm distal to the olecranon. This is nontender and has been stable for years  Past medical history, Surgical history, Family history not pertinant except as noted below, Social history, Allergies, and medications have been entered into the medical record, reviewed, and no changes needed.   Review of Systems: No headache, visual changes, nausea, vomiting, diarrhea, constipation, dizziness, abdominal pain, skin rash, fevers, chills, night sweats, weight loss, swollen lymph nodes, body aches, joint swelling, muscle aches, chest pain, shortness of breath, mood changes, visual or auditory hallucinations.   Objective:   General: Well Developed, well nourished, and in no acute distress.  Neuro:  Extra-ocular muscles intact, able to move all 4 extremities, sensation grossly intact.  Deep tendon reflexes tested were normal. Psych: Alert and oriented, mood congruent with affect. ENT:  Ears and nose appear unremarkable.  Hearing grossly normal. Neck: Unremarkable overall appearance, trachea midline.  No visible thyroid enlargement. Eyes: Conjunctivae and lids appear unremarkable.  Pupils equal and round. Skin: Warm and dry, no rashes noted.  Cardiovascular: Pulses palpable, no extremity edema. Left shoulder: Tender to palpation over the entire shoulder, approximately 5 of external rotation past neutral, abduction is relatively normal but  painful. Left forearm: There is a 6-7 cm well-defined, fusiform mass over the posterior forearm approximately 4 cm distal to the olecranon.  Procedure: Real-time Ultrasound Guided Injection of left glenohumeral joint Device: GE Logiq E  Verbal informed consent obtained.  Time-out conducted.  Noted no overlying erythema, induration, or other signs of local infection.  Skin prepped in a sterile fashion.  Local anesthesia: Topical Ethyl chloride.  With sterile technique and under real time ultrasound guidance:  Using a spinal needle I injected 1 mL Kenalog 40, 2 mL lidocaine, 2 mL bupivacaine. Completed without difficulty  Pain immediately resolved suggesting accurate placement of the medication.  Advised to call if fevers/chills, erythema, induration, drainage, or persistent bleeding.  Images permanently stored and available for review in the ultrasound unit.  Impression: Technically successful ultrasound guided injection.  Impression and Recommendations:   This case required medical decision making of moderate complexity.  Mass of forearm, left Return for surgical removal, 30 minute slot.  Primary osteoarthritis of left shoulder Injection, x-rays, home health physical therapy.  She is post right shoulder arthroplasty. Return in 6 weeks.

## 2017-06-09 ENCOUNTER — Other Ambulatory Visit: Payer: Self-pay | Admitting: Physician Assistant

## 2017-06-09 ENCOUNTER — Ambulatory Visit (INDEPENDENT_AMBULATORY_CARE_PROVIDER_SITE_OTHER): Payer: Medicare Other | Admitting: Family Medicine

## 2017-06-09 ENCOUNTER — Ambulatory Visit: Payer: Medicare Other

## 2017-06-09 VITALS — BP 131/64 | HR 71

## 2017-06-09 DIAGNOSIS — I83218 Varicose veins of right lower extremity with both ulcer of other part of lower extremity and inflammation: Secondary | ICD-10-CM | POA: Diagnosis not present

## 2017-06-09 DIAGNOSIS — L97801 Non-pressure chronic ulcer of other part of unspecified lower leg limited to breakdown of skin: Secondary | ICD-10-CM | POA: Diagnosis not present

## 2017-06-09 DIAGNOSIS — I872 Venous insufficiency (chronic) (peripheral): Secondary | ICD-10-CM

## 2017-06-09 DIAGNOSIS — I129 Hypertensive chronic kidney disease with stage 1 through stage 4 chronic kidney disease, or unspecified chronic kidney disease: Secondary | ICD-10-CM | POA: Diagnosis not present

## 2017-06-09 DIAGNOSIS — L97811 Non-pressure chronic ulcer of other part of right lower leg limited to breakdown of skin: Secondary | ICD-10-CM | POA: Diagnosis not present

## 2017-06-09 DIAGNOSIS — Z298 Encounter for other specified prophylactic measures: Secondary | ICD-10-CM

## 2017-06-09 DIAGNOSIS — Z2989 Encounter for other specified prophylactic measures: Secondary | ICD-10-CM

## 2017-06-09 DIAGNOSIS — L03116 Cellulitis of left lower limb: Secondary | ICD-10-CM | POA: Diagnosis not present

## 2017-06-09 MED ORDER — AMOXICILLIN 500 MG PO CAPS: 2000.0000 mg | ORAL_CAPSULE | Freq: Once | ORAL | 0 refills | Status: AC

## 2017-06-09 NOTE — Progress Notes (Signed)
   Subjective:    Patient ID: Bonnie Clark, female    DOB: 06/02/1941, 76 y.o.   MRN: [id number]  HPI Follow-up venous stasis ulcer-she is been wearing Unna boots bilaterally. She said they started to slide down somewhat so she actually had an extra Ace wrap at home that she put on top and this seemed to help. She says her legs have been swelling a little bit more than normally. No fevers chills or sweats.  Also like a prescription called into the pharmacy for prophylaxis. She is actually going to have a large lipoma removed near her left elbow on Monday and she has several artificial joints and would like to have prophylactic treatment.   Review of Systems     Objective:   Physical Exam  Constitutional: She is oriented to person, place, and time. She appears well-developed and well-nourished.  HENT:  Head: Normocephalic and atraumatic.  Neurological: She is alert and oriented to person, place, and time.  Skin: Skin is warm.  paeu d'orange skin bilaterally with larger open wound 2.5 x 1.5 cm on the right lower leg.  Smaller is 7 x 8 mm near this. Also seen in photo  Psychiatric: She has a normal mood and affect. Her behavior is normal.            Assessment & Plan:  Venous stasis ulcerations with blistering secondary to edema-continue with in a bruit compression.  Unna boots placed today. Xeroform placed directly over the ulcers in the fixture above. Follow-up on Monday for change of boots. Try to keep elevated as much as  possible to reduce edema and swelling.  Preexposure prophylaxis-we'll go ahead and sent her prescription for 2000 mg of amoxicillin to her pharmacy. If the morning before her procedure.

## 2017-06-09 NOTE — Progress Notes (Signed)
Pt present to clinic for wound check of bilateral lower leg.  New open wounds seen on RLE. Wounds examined by Dr. Linford Arnold.  Both legs wrapped in one una boot each and covered with one ace wrap. Pt tolerated well. She is requesting for 4 tabs of amoxicillin to be sent to her [facility] pharmacy on [facility].  Will route to provider for further review. -EH/RMA

## 2017-06-12 ENCOUNTER — Other Ambulatory Visit: Payer: Self-pay | Admitting: Physician Assistant

## 2017-06-15 ENCOUNTER — Ambulatory Visit (INDEPENDENT_AMBULATORY_CARE_PROVIDER_SITE_OTHER): Payer: Medicare Other | Admitting: Sports Medicine

## 2017-06-15 ENCOUNTER — Ambulatory Visit (INDEPENDENT_AMBULATORY_CARE_PROVIDER_SITE_OTHER): Payer: Medicare Other | Admitting: Family Medicine

## 2017-06-15 ENCOUNTER — Encounter: Payer: Self-pay | Admitting: Sports Medicine

## 2017-06-15 ENCOUNTER — Other Ambulatory Visit: Payer: Self-pay

## 2017-06-15 VITALS — BP 138/77 | HR 65 | Resp 20

## 2017-06-15 DIAGNOSIS — L97801 Non-pressure chronic ulcer of other part of unspecified lower leg limited to breakdown of skin: Secondary | ICD-10-CM | POA: Diagnosis not present

## 2017-06-15 DIAGNOSIS — I872 Venous insufficiency (chronic) (peripheral): Secondary | ICD-10-CM

## 2017-06-15 DIAGNOSIS — M7989 Other specified soft tissue disorders: Secondary | ICD-10-CM

## 2017-06-15 DIAGNOSIS — R2232 Localized swelling, mass and lump, left upper limb: Secondary | ICD-10-CM

## 2017-06-15 DIAGNOSIS — L905 Scar conditions and fibrosis of skin: Secondary | ICD-10-CM

## 2017-06-15 MED ORDER — HYDROCODONE-ACETAMINOPHEN 5-325 MG PO TABS: 1.0000 | ORAL_TABLET | Freq: Three times a day (TID) | ORAL | 0 refills | Status: DC | PRN

## 2017-06-15 NOTE — Progress Notes (Signed)
   Subjective:    Patient ID: Bonnie Clark, female    DOB: [birth year], 76 y.o.   MRN: [id number]  HPI  Follow-up venous stasis ulcers of the lower legs limited to skin breakdown-she comes in today for seven-day follow-up. She left the Unna boots on until early this morning and then took them off. She did put a moisturizer on them this morning. She still has a small ulceration on the right lower leg that smaller than it was previously. She has no vesicles or blisters. She does have significant pitting edema both lower extremities and feet.  Review of Systems     Objective:   Physical Exam  Constitutional: She is oriented to person, place, and time. She appears well-developed and well-nourished.  Sitting in motorized wheelchair.   HENT:  Head: Normocephalic and atraumatic.  Eyes: Conjunctivae and EOM are normal.  Cardiovascular: Normal rate.   Pulmonary/Chest: Effort normal.  Neurological: She is alert and oriented to person, place, and time.  Skin: Skin is dry. No pallor.  Both lower legs with 2+ pitting edema and some thickening of the skin. The skin has a more pink coloration. On the right lateral leg she has an approx 8 mm erythematous ulceration.  It is round. No active drainage. On the left lower leg just above the she has a small abrasion  Psychiatric: She has a normal mood and affect. Her behavior is normal.  Vitals reviewed.         Assessment & Plan:  Stasis ulcers of the lower legs limited to skin breakdown-Xeroform placed over the area of open wound on the right outer leg and the small skin abrasion over the left anterior leg just above the ankle.  2 Unna boots placed. She would prefer to leave them on for 1 week so I will see her back next Monday.

## 2017-06-15 NOTE — Progress Notes (Signed)
   Procedure:  Excision of 6-7 cm left forearm mass Risks, benefits, and alternatives explained and consent obtained. Time out conducted. Surface prepped with alcohol. 10 mL of bupivacaine with epinephrine infiltrated in a field block Adequate anesthesia ensured. Area prepped and draped in a sterile fashion. Excision performed with: Using a #15 blade I made a linear incision approximately 8 cm long, then using both sharp and blunt dissection I removed the mass in his entirety, there was some coffee ground appearance suggestive of old blood, there is also what appeared to be the capsule of an old sebaceous cyst which was also removed in its entirety. The entire procedure took place in the subcutaneous tissues, I did not enter the muscle fascia.  The wound was inspected and noted that the excision was complete. I then closed the incision with a single 4-0 running subcuticular Vicryl suture. I did send off part of the mass for pathology. Hemostasis achieved. A compression dressing was applied. Pt stable.

## 2017-06-15 NOTE — Progress Notes (Signed)
Pt here for bilateral leg wound check after unna boots. Pt states not having any blisters, fever, medication concerns at this time. Legs examined by Dr. Linford Arnold.

## 2017-06-15 NOTE — Telephone Encounter (Signed)
Recommend decreasing dose to 0.5mg  and the frequency to twice daily only as needed given history of dizziness Recommend trying to taper down slowly to lowest possible dose  Patient should be reassessed before any more refills are given

## 2017-06-15 NOTE — Assessment & Plan Note (Signed)
Surgical excision as above, return in 7 days for a wound check.

## 2017-06-16 MED ORDER — CLONAZEPAM 1 MG PO TABS: 0.5000 mg | ORAL_TABLET | Freq: Two times a day (BID) | ORAL | 0 refills | Status: DC | PRN

## 2017-06-16 NOTE — Addendum Note (Signed)
Addended by: Monica Becton on: 06/16/2017 02:04 PM   Modules accepted: Orders

## 2017-06-22 ENCOUNTER — Ambulatory Visit (INDEPENDENT_AMBULATORY_CARE_PROVIDER_SITE_OTHER): Payer: Medicare Other | Admitting: Sports Medicine

## 2017-06-22 ENCOUNTER — Encounter: Payer: Self-pay | Admitting: Sports Medicine

## 2017-06-22 DIAGNOSIS — I83001 Varicose veins of unspecified lower extremity with ulcer of thigh: Secondary | ICD-10-CM | POA: Diagnosis not present

## 2017-06-22 DIAGNOSIS — L97101 Non-pressure chronic ulcer of unspecified thigh limited to breakdown of skin: Secondary | ICD-10-CM

## 2017-06-22 DIAGNOSIS — R2232 Localized swelling, mass and lump, left upper limb: Secondary | ICD-10-CM | POA: Diagnosis not present

## 2017-06-22 NOTE — Assessment & Plan Note (Signed)
Bilateral Unna boots applied. Return in a nurse visit for repeat unna boots in a week.

## 2017-06-22 NOTE — Progress Notes (Signed)
  Subjective:    CC: Follow-up  HPI: I removed a large sebaceous cyst about a week ago, we did a primary closure with running septic procedures, the incision looks fantastic.  Venous stasis dermatitis: Needs Unna boots placed today.  Past medical history:  Negative.  See flowsheet/record as well for more information.  Surgical history: Negative.  See flowsheet/record as well for more information.  Family history: Negative.  See flowsheet/record as well for more information.  Social history: Negative.  See flowsheet/record as well for more information.  Allergies, and medications have been entered into the medical record, reviewed, and no changes needed.   Review of Systems: No fevers, chills, night sweats, weight loss, chest pain, or shortness of breath.   Objective:    General: Well Developed, well nourished, and in no acute distress.  Neuro: Alert and oriented x3, extra-ocular muscles intact, sensation grossly intact.  HEENT: Normocephalic, atraumatic, pupils equal round reactive to light, neck supple, no masses, no lymphadenopathy, thyroid nonpalpable.  Skin: Warm and dry, no rashes. Cardiac: Regular rate and rhythm, no murmurs rubs or gallops, no lower extremity edema.  Respiratory: Clear to auscultation bilaterally. Not using accessory muscles, speaking in full sentences. Left forearm: Incision is clean, dry, intact.  Unna boots placed on both lower extremities  Impression and Recommendations:    Mass of forearm, left Doing extremely well post surgical excision and absorbable suturing.  Venous stasis ulcer of thigh limited to breakdown of skin with varicose veins (HCC) Bilateral Unna boots applied. Return in a nurse visit for repeat unna boots in a week.  I spent 25 minutes with this patient, greater than 50% was face-to-face time counseling regarding the above diagnoses, this was separate from the time spent performing the procedure

## 2017-06-22 NOTE — Assessment & Plan Note (Signed)
Doing extremely well post surgical excision and absorbable suturing.

## 2017-06-29 ENCOUNTER — Ambulatory Visit (INDEPENDENT_AMBULATORY_CARE_PROVIDER_SITE_OTHER): Payer: Medicare Other | Admitting: Sports Medicine

## 2017-06-29 DIAGNOSIS — L97101 Non-pressure chronic ulcer of unspecified thigh limited to breakdown of skin: Secondary | ICD-10-CM | POA: Diagnosis not present

## 2017-06-29 DIAGNOSIS — L03119 Cellulitis of unspecified part of limb: Secondary | ICD-10-CM

## 2017-06-29 DIAGNOSIS — R319 Hematuria, unspecified: Secondary | ICD-10-CM | POA: Diagnosis not present

## 2017-06-29 DIAGNOSIS — I83001 Varicose veins of unspecified lower extremity with ulcer of thigh: Secondary | ICD-10-CM | POA: Diagnosis not present

## 2017-06-29 LAB — POCT URINALYSIS DIPSTICK

## 2017-06-29 NOTE — Progress Notes (Signed)
   Subjective:    Patient ID: Bonnie Clark, female    DOB: June 18, 1941, 76 y.o.   MRN: [id number]  HPI Pt was in today to have unna boot placed on left and right leg as well as a wound check for removal of sebaceous cyst on left forearm. Unna boots placed with no complication.    Review of Systems     Objective:   Physical Exam        Assessment & Plan:   I did going to check the Unna boots, her venous stasis dermatitis, as well as her incision, all are doing well. She'll return in one week for repeat Unna boot placement on the nurse visit schedule. ___________________________________________ Ihor Austin. Benjamin Stain, M.D., ABFM., CAQSM. Primary Care and Sports Medicine [facility]  Adjunct Instructor of Family Medicine  [employer]

## 2017-07-06 ENCOUNTER — Ambulatory Visit: Payer: Medicare Other

## 2017-07-08 ENCOUNTER — Ambulatory Visit (INDEPENDENT_AMBULATORY_CARE_PROVIDER_SITE_OTHER): Payer: Medicare Other | Admitting: Physician Assistant

## 2017-07-08 ENCOUNTER — Telehealth: Payer: Self-pay | Admitting: Physician Assistant

## 2017-07-08 VITALS — BP 117/79 | Temp 97.9°F

## 2017-07-08 DIAGNOSIS — R29898 Other symptoms and signs involving the musculoskeletal system: Secondary | ICD-10-CM

## 2017-07-08 DIAGNOSIS — R2689 Other abnormalities of gait and mobility: Secondary | ICD-10-CM

## 2017-07-08 DIAGNOSIS — I83001 Varicose veins of unspecified lower extremity with ulcer of thigh: Secondary | ICD-10-CM | POA: Diagnosis not present

## 2017-07-08 DIAGNOSIS — L97101 Non-pressure chronic ulcer of unspecified thigh limited to breakdown of skin: Secondary | ICD-10-CM | POA: Diagnosis not present

## 2017-07-08 DIAGNOSIS — M5136 Other intervertebral disc degeneration, lumbar region: Secondary | ICD-10-CM

## 2017-07-08 DIAGNOSIS — M51369 Other intervertebral disc degeneration, lumbar region without mention of lumbar back pain or lower extremity pain: Secondary | ICD-10-CM

## 2017-07-08 MED ORDER — AMBULATORY NON FORMULARY MEDICATION: 0 refills | Status: DC

## 2017-07-08 NOTE — Progress Notes (Signed)
   Subjective:    Patient ID: Bonnie Clark, female    DOB: [birth year], 76 y.o.   MRN: [id number]  HPI  Venous stasis dermatitis - Needs bilateral unna boots placed today. Both legs look much better than they have in the past.   Review of Systems     Objective:   Physical Exam        Assessment & Plan:  Venous stasis dermatitis - Bilateral unna boot placed without complications.   Walked into exam room legs look much better. Bilateral legs erythematous without blisters or ulcers.  Agree with above plan. Follow up to replace in 1 week. Tandy Gaw PA-C

## 2017-07-08 NOTE — Telephone Encounter (Signed)
PT's husband apparently misplaced the document for a new wheelchair and would like to have another on created, so that he can pick it up.

## 2017-07-08 NOTE — Telephone Encounter (Signed)
Reprinted and ready for pick up.

## 2017-07-09 NOTE — Telephone Encounter (Signed)
Left VM advising Rx is ready for pick up

## 2017-07-10 ENCOUNTER — Other Ambulatory Visit: Payer: Self-pay | Admitting: Physician Assistant

## 2017-07-12 ENCOUNTER — Other Ambulatory Visit: Payer: Self-pay | Admitting: Physician Assistant

## 2017-07-12 DIAGNOSIS — N183 Chronic kidney disease, stage 3 unspecified: Secondary | ICD-10-CM

## 2017-07-12 DIAGNOSIS — E1122 Type 2 diabetes mellitus with diabetic chronic kidney disease: Secondary | ICD-10-CM

## 2017-07-13 ENCOUNTER — Ambulatory Visit: Payer: Medicare Other

## 2017-07-14 ENCOUNTER — Ambulatory Visit (INDEPENDENT_AMBULATORY_CARE_PROVIDER_SITE_OTHER): Payer: Medicare Other | Admitting: Physician Assistant

## 2017-07-14 VITALS — BP 105/64 | HR 61

## 2017-07-14 DIAGNOSIS — I872 Venous insufficiency (chronic) (peripheral): Secondary | ICD-10-CM

## 2017-07-14 NOTE — Progress Notes (Signed)
Patient is here for unna boot dressing change. Legs were visualized by Tandy Gaw PA-C. Patient will return on Monday.  Bilateral legs seem to be doing great. Erythema improving. No open sores or blisters. Agree with above plan. Tandy Gaw PA-C

## 2017-07-20 ENCOUNTER — Ambulatory Visit (INDEPENDENT_AMBULATORY_CARE_PROVIDER_SITE_OTHER): Payer: Medicare Other | Admitting: Physician Assistant

## 2017-07-20 VITALS — BP 139/65

## 2017-07-20 DIAGNOSIS — I872 Venous insufficiency (chronic) (peripheral): Secondary | ICD-10-CM

## 2017-07-20 NOTE — Progress Notes (Signed)
Patient is here for bilateral unna boot. Placement.   Bilateral legs look great. Scant erythema. Legs are cool to touch. Replace boot and follow up in one week. Jade breeback PA-C

## 2017-07-28 ENCOUNTER — Ambulatory Visit (INDEPENDENT_AMBULATORY_CARE_PROVIDER_SITE_OTHER): Payer: Medicare Other | Admitting: Physician Assistant

## 2017-07-28 ENCOUNTER — Other Ambulatory Visit: Payer: Self-pay | Admitting: Physician Assistant

## 2017-07-28 VITALS — BP 120/59 | HR 83 | Temp 99.0°F

## 2017-07-28 DIAGNOSIS — I872 Venous insufficiency (chronic) (peripheral): Secondary | ICD-10-CM

## 2017-07-28 NOTE — Progress Notes (Signed)
   Subjective:    Patient ID: Bonnie Clark, female    DOB: [birth year], 76 y.o.   MRN: [id number]  HPI Pt comes in for one week follow up on unna boot placement and recheck. Pt is doing well. She took wrap off this am.    Review of Systems See HPI.     Objective:   Physical Exam  Skin:  Slightly pink appearance to anterior lower leg and bilateral large calfs. No blisters, open wounds. Nonpitting edema present.           Assessment & Plan:  [name] KitchenMarland KitchenSouthern was seen today for wound check.  Diagnoses and all orders for this visit:  Venous stasis dermatitis of both lower extremities -     Unna boot   1 week follow up. Looking good.  She does not want to be referred to home health for wrapping. When she stops swelling occurs and wounds occur.

## 2017-08-03 ENCOUNTER — Ambulatory Visit (INDEPENDENT_AMBULATORY_CARE_PROVIDER_SITE_OTHER): Payer: Medicare Other | Admitting: Physician Assistant

## 2017-08-03 VITALS — BP 112/64 | HR 61 | Temp 97.8°F

## 2017-08-03 DIAGNOSIS — I872 Venous insufficiency (chronic) (peripheral): Secondary | ICD-10-CM

## 2017-08-03 NOTE — Progress Notes (Signed)
   Subjective:    Patient ID: Bonnie Clark, female    DOB: 12-10-1940, 76 y.o.   MRN: [id number]  HPI Pt comes into today to have her legs re-wrapped.    Review of Systems     Objective:   Physical Exam        Assessment & Plan:  Bilateral legs look great with scant anterior erythema. 1+ bilateral edema. rewrap today. Follow up today. Tandy Gaw PA-C

## 2017-08-05 ENCOUNTER — Ambulatory Visit: Payer: Medicare Other | Admitting: Physician Assistant

## 2017-08-05 ENCOUNTER — Ambulatory Visit: Payer: Medicare Other

## 2017-08-06 ENCOUNTER — Ambulatory Visit (INDEPENDENT_AMBULATORY_CARE_PROVIDER_SITE_OTHER): Payer: Medicare Other

## 2017-08-06 ENCOUNTER — Encounter: Payer: Self-pay | Admitting: Physician Assistant

## 2017-08-06 ENCOUNTER — Ambulatory Visit (INDEPENDENT_AMBULATORY_CARE_PROVIDER_SITE_OTHER): Payer: Medicare Other | Admitting: Physician Assistant

## 2017-08-06 VITALS — BP 128/71 | HR 88 | Temp 99.2°F | Resp 18

## 2017-08-06 DIAGNOSIS — I482 Chronic atrial fibrillation: Secondary | ICD-10-CM

## 2017-08-06 DIAGNOSIS — R600 Localized edema: Secondary | ICD-10-CM

## 2017-08-06 DIAGNOSIS — R0602 Shortness of breath: Secondary | ICD-10-CM | POA: Diagnosis not present

## 2017-08-06 DIAGNOSIS — I872 Venous insufficiency (chronic) (peripheral): Secondary | ICD-10-CM

## 2017-08-06 DIAGNOSIS — I517 Cardiomegaly: Secondary | ICD-10-CM

## 2017-08-06 DIAGNOSIS — R7981 Abnormal blood-gas level: Secondary | ICD-10-CM | POA: Diagnosis not present

## 2017-08-06 DIAGNOSIS — I501 Left ventricular failure: Secondary | ICD-10-CM | POA: Diagnosis not present

## 2017-08-06 DIAGNOSIS — I509 Heart failure, unspecified: Secondary | ICD-10-CM | POA: Insufficient documentation

## 2017-08-06 DIAGNOSIS — R630 Anorexia: Secondary | ICD-10-CM | POA: Diagnosis not present

## 2017-08-06 DIAGNOSIS — R1084 Generalized abdominal pain: Secondary | ICD-10-CM | POA: Diagnosis not present

## 2017-08-06 DIAGNOSIS — I4821 Permanent atrial fibrillation: Secondary | ICD-10-CM

## 2017-08-06 LAB — COMPLETE METABOLIC PANEL WITH GFR
AG RATIO: 1.3 (calc) (ref 1.0–2.5)
ALT: 13 U/L (ref 6–29)
AST: 19 U/L (ref 10–35)
Albumin: 3.5 g/dL — ABNORMAL LOW (ref 3.6–5.1)
Alkaline phosphatase (APISO): 60 U/L (ref 33–130)
BUN/Creatinine Ratio: 16 (calc) (ref 6–22)
BUN: 19 mg/dL (ref 7–25)
CALCIUM: 8.4 mg/dL — AB (ref 8.6–10.4)
CO2: 26 mmol/L (ref 20–32)
Chloride: 98 mmol/L (ref 98–110)
Creat: 1.22 mg/dL — ABNORMAL HIGH (ref 0.60–0.93)
GFR, EST AFRICAN AMERICAN: 50 mL/min/{1.73_m2} — AB (ref >=60)
GFR, EST NON AFRICAN AMERICAN: 43 mL/min/{1.73_m2} — AB (ref >=60)
GLOBULIN: 2.6 g/dL (ref 1.9–3.7)
Glucose, Bld: 61 mg/dL — ABNORMAL LOW (ref 65–99)
POTASSIUM: 4.2 mmol/L (ref 3.5–5.3)
SODIUM: 135 mmol/L (ref 135–146)
Total Bilirubin: 0.9 mg/dL (ref 0.2–1.2)
Total Protein: 6.1 g/dL (ref 6.1–8.1)

## 2017-08-06 LAB — CBC WITH DIFFERENTIAL/PLATELET
BASOS ABS: 21 {cells}/uL (ref 0–200)
Basophils Relative: 0.2 %
Eosinophils Absolute: 198 cells/uL (ref 15–500)
Eosinophils Relative: 1.9 %
HEMATOCRIT: 32.6 % — AB (ref 35.0–45.0)
Hemoglobin: 10.9 g/dL — ABNORMAL LOW (ref 11.7–15.5)
LYMPHS ABS: 832 {cells}/uL — AB (ref 850–3900)
MCH: 31.3 pg (ref 27.0–33.0)
MCHC: 33.4 g/dL (ref 32.0–36.0)
MCV: 93.7 fL (ref 80.0–100.0)
MPV: 10.8 fL (ref 7.5–12.5)
Monocytes Relative: 15.1 %
NEUTROS PCT: 74.8 %
Neutro Abs: 7779 cells/uL (ref 1500–7800)
Platelets: 162 10*3/uL (ref 140–400)
RBC: 3.48 10*6/uL — AB (ref 3.80–5.10)
RDW: 14.4 % (ref 11.0–15.0)
Total Lymphocyte: 8 %
WBC: 10.4 10*3/uL (ref 3.8–10.8)
WBCMIX: 1570 {cells}/uL — AB (ref 200–950)

## 2017-08-06 LAB — LIPASE: LIPASE: 25 U/L (ref 7–60)

## 2017-08-06 LAB — AMYLASE: Amylase: 22 U/L (ref 21–101)

## 2017-08-06 MED ORDER — TORSEMIDE 10 MG PO TABS: 10.0000 mg | ORAL_TABLET | Freq: Two times a day (BID) | ORAL | 0 refills | Status: DC

## 2017-08-06 NOTE — Patient Instructions (Addendum)
- Increase Torsemide to 1 tab twice a day - Limit sodium to <1500 mg per day - Limit fluid to 2 liters per day - Follow-up with PCP next week (Tuesday or Wednesday) - Schedule a follow-up with cardiologist, Dr. Jens Som    Heart Failure Heart failure is a condition in which the heart has trouble pumping blood because it has become weak or stiff. This means that the heart does not pump blood efficiently for the body to work well. For some people with heart failure, fluid may back up into the lungs and there may be swelling (edema) in the lower legs. Heart failure is usually a long-term (chronic) condition. It is important for you to take good care of yourself and follow the treatment plan from your health care provider. What are the causes? This condition is caused by some health problems, including:  High blood pressure (hypertension). Hypertension causes the heart muscle to work harder than normal. High blood pressure eventually causes the heart to become stiff and weak.  Coronary artery disease (CAD). CAD is the buildup of cholesterol and fat (plaques) in the arteries of the heart.  Heart attack (myocardial infarction). Injured tissue, which is caused by the heart attack, does not contract as well and the heart's ability to pump blood is weakened.  Abnormal heart valves. When the heart valves do not open and close properly, the heart muscle must pump harder to keep the blood flowing.  Heart muscle disease (cardiomyopathy or myocarditis). Heart muscle disease is damage to the heart muscle from a variety of causes, such as drug or alcohol abuse, infections, or unknown causes. These can increase the risk of heart failure.  Lung disease. When the lungs do not work properly, the heart must work harder.  What increases the risk? Risk of heart failure increases as a person ages. This condition is also more likely to develop in people who:  Are overweight.  Are female.  Smoke or chew  tobacco.  Abuse alcohol or illegal drugs.  Have taken medicines that can damage the heart, such as chemotherapy drugs.  Have diabetes. ? High blood sugar (glucose) is associated with high fat (lipid) levels in the blood. ? Diabetes can also damage tiny blood vessels that carry nutrients to the heart muscle.  Have abnormal heart rhythms.  Have thyroid problems.  Have low blood counts (anemia).  What are the signs or symptoms? Symptoms of this condition include:  Shortness of breath with activity, such as when climbing stairs.  Persistent cough.  Swelling of the feet, ankles, legs, or abdomen.  Unexplained weight gain.  Difficulty breathing when lying flat (orthopnea).  Waking from sleep because of the need to sit up and get more air.  Rapid heartbeat.  Fatigue and loss of energy.  Feeling light-headed, dizzy, or close to fainting.  Loss of appetite.  Nausea.  Increased urination during the night (nocturia).  Confusion.  How is this diagnosed? This condition is diagnosed based on:  Medical history, symptoms, and a physical exam.  Diagnostic tests, which may include: ? Echocardiogram. ? Electrocardiogram (ECG). ? Chest X-ray. ? Blood tests. ? Exercise stress test. ? Radionuclide scans. ? Cardiac catheterization and angiogram.  How is this treated? Treatment for this condition is aimed at managing the symptoms of heart failure. Medicines, behavioral changes, or other treatments may be necessary to treat heart failure. Medicines These may include:  Angiotensin-converting enzyme (ACE) inhibitors. This type of medicine blocks the effects of a blood protein called angiotensin-converting  enzyme. ACE inhibitors relax (dilate) the blood vessels and help to lower blood pressure.  Angiotensin receptor blockers (ARBs). This type of medicine blocks the actions of a blood protein called angiotensin. ARBs dilate the blood vessels and help to lower blood  pressure.  Water pills (diuretics). Diuretics cause the kidneys to remove salt and water from the blood. The extra fluid is removed through urination, leaving a lower volume of blood that the heart has to pump.  Beta blockers. These improve heart muscle strength and they prevent the heart from beating too quickly.  Digoxin. This increases the force of the heartbeat.  Healthy behavior changes These may include:  Reaching and maintaining a healthy weight.  Stopping smoking or chewing tobacco.  Eating heart-healthy foods.  Limiting or avoiding alcohol.  Stopping use of street drugs (illegal drugs).  Physical activity.  Other treatments These may include:  Surgery to open blocked coronary arteries or repair damaged heart valves.  Placement of a biventricular pacemaker to improve heart muscle function (cardiac resynchronization therapy). This device paces both the right ventricle and left ventricle.  Placement of a device to treat serious abnormal heart rhythms (implantable cardioverter defibrillator, or ICD).  Placement of a device to improve the pumping ability of the heart (left ventricular assist device, or LVAD).  Heart transplant. This can cure heart failure, and it is considered for certain patients who do not improve with other therapies.  Follow these instructions at home: Medicines  Take over-the-counter and prescription medicines only as told by your health care provider. Medicines are important in reducing the workload of your heart, slowing the progression of heart failure, and improving your symptoms. ? Do not stop taking your medicine unless your health care provider told you to do that. ? Do not skip any dose of medicine. ? Refill your prescriptions before you run out of medicine. You need your medicines every day. Eating and drinking   Eat heart-healthy foods. Talk with a dietitian to make an eating plan that is right for you. ? Choose foods that contain no  trans fat and are low in saturated fat and cholesterol. Healthy choices include fresh or frozen fruits and vegetables, fish, lean meats, legumes, fat-free or low-fat dairy products, and whole-grain or high-fiber foods. ? Limit salt (sodium) if directed by your health care provider. Sodium restriction may reduce symptoms of heart failure. Ask a dietitian to recommend heart-healthy seasonings. ? Use healthy cooking methods instead of frying. Healthy methods include roasting, grilling, broiling, baking, poaching, steaming, and stir-frying.  Limit your fluid intake if directed by your health care provider. Fluid restriction may reduce symptoms of heart failure. Lifestyle  Stop smoking or using chewing tobacco. Nicotine and tobacco can damage your heart and your blood vessels. Do not use nicotine gum or patches before talking to your health care provider.  Limit alcohol intake to no more than 1 drink per day for non-pregnant women and 2 drinks per day for men. One drink equals 12 oz of beer, 5 oz of wine, or 1 oz of hard liquor. ? Drinking more than that is harmful to your heart. Tell your health care provider if you drink alcohol several times a week. ? Talk with your health care provider about whether any level of alcohol use is safe for you. ? If your heart has already been damaged by alcohol or you have severe heart failure, drinking alcohol should be stopped completely.  Stop use of illegal drugs.  Lose weight if directed by  your health care provider. Weight loss may reduce symptoms of heart failure.  Do moderate physical activity if directed by your health care provider. People who are elderly and people with severe heart failure should consult with a health care provider for physical activity recommendations. Monitor important information  Weigh yourself every day. Keeping track of your weight daily helps you to notice excess fluid sooner. ? Weigh yourself every morning after you urinate and  before you eat breakfast. ? Wear the same amount of clothing each time you weigh yourself. ? Record your daily weight. Provide your health care provider with your weight record.  Monitor and record your blood pressure as told by your health care provider.  Check your pulse as told by your health care provider. Dealing with extreme temperatures  If the weather is extremely hot: ? Avoid vigorous physical activity. ? Use air conditioning or fans or seek a cooler location. ? Avoid caffeine and alcohol. ? Wear loose-fitting, lightweight, and light-colored clothing.  If the weather is extremely cold: ? Avoid vigorous physical activity. ? Layer your clothes. ? Wear mittens or gloves, a hat, and a scarf when you go outside. ? Avoid alcohol. General instructions  Manage other health conditions such as hypertension, diabetes, thyroid disease, or abnormal heart rhythms as told by your health care provider.  Learn to manage stress. If you need help to do this, ask your health care provider.  Plan rest periods when fatigued.  Get ongoing education and support as needed.  Participate in or seek rehabilitation as needed to maintain or improve independence and quality of life.  Stay up to date with immunizations. Keeping current on pneumococcal and influenza immunizations is especially important to prevent respiratory infections.  Keep all follow-up visits as told by your health care provider. This is important. Contact a health care provider if:  You have a rapid weight gain.  You have increasing shortness of breath that is unusual for you.  You are unable to participate in your usual physical activities.  You tire easily.  You cough more than normal, especially with physical activity.  You have any swelling or more swelling in areas such as your hands, feet, ankles, or abdomen.  You are unable to sleep because it is hard to breathe.  You feel like your heart is beating quickly  (palpitations).  You become dizzy or light-headed when you stand up. Get help right away if:  You have difficulty breathing.  You notice or your family notices a change in your awareness, such as having trouble staying awake or having difficulty with concentration.  You have pain or discomfort in your chest.  You have an episode of fainting (syncope). This information is not intended to replace advice given to you by your health care provider. Make sure you discuss any questions you have with your health care provider. Document Released: 11/10/2005 Document Revised: 07/15/2016 Document Reviewed: 06/04/2016 Elsevier Interactive Patient Education  2017 [employer].

## 2017-08-06 NOTE — Progress Notes (Signed)
HPI:                                                                Bonnie Clark is a 76 y.o. female who presents to [facility]: [facility] today for "fever and no appetite"  Patient is accompanied by husband and daughter-in-law today.  Patient with PMH of CHF, Afib, DM II, HLD, HTN reports sudden onset malaise and anorexia beginning 3 days ago. Has not had an appetite and was vomiting every time she ate or drank anything. States she is feeling improved today, but still "somewhat off." Reports she is tolerating liquids and solids today, but had an episode of watery diarrhea.   Daughter-in-law states that patient had altered mental status, fever of 100.1, shortness of breath, and low oxygen saturation of 92% on Tuesday. She wanted to contact EMS, but patient refused transport to the Emergency Room. Husband states that she has been complaining about right sided rib pain. She has no history of underlying pulmonary disease.   Past Medical History:  Diagnosis Date  . Anxiety and depression 09/11/2011  . Arthritis   . Atrial fibrillation (HCC)   . Chronic insomnia 09/11/2011  . Diabetes mellitus   . Fibromyalgia 09/11/2011  . Gait disorder 12/21/2011  . Gout 09/11/2011  . Hyperlipidemia   . Hypertension   . OAB (overactive bladder) 06/21/2012  . Obesity 09/11/2011  . Peripheral edema 06/21/2012  . Sciatica of right side 09/11/2011  . Spinal stenosis 09/11/2011  . Thyroid disease   . Urine incontinence    Past Surgical History:  Procedure Laterality Date  . REPLACEMENT TOTAL KNEE BILATERAL     right approx 1994, left approx 1999  . SHOULDER SURGERY     right - approx 2005  . THYROIDECTOMY, PARTIAL     at 76yo   Social History  Substance Use Topics  . Smoking status: Never Smoker  . Smokeless tobacco: Never Used  . Alcohol use No   family history includes Arthritis in her father and mother; Depression in her mother; Stroke in her  sister.  ROS: negative except as noted in the HPI  Medications: Current Outpatient Prescriptions  Medication Sig Dispense Refill  . allopurinol (ZYLOPRIM) 300 MG tablet Take 1 tablet (300 mg total) by mouth daily. Due for follow up appointment in August 90 tablet 0  . AMBULATORY NON FORMULARY MEDICATION Need for one power wheelchair due to balance instability, lower extremity weakness, and lumbar DDD.  DX: R26.89, R29.898, M51.36 1 Device 0  . AMBULATORY NON FORMULARY MEDICATION Need for one power wheelchair due to balance instability, lower extremity weakness, and lumbar DDD.  DX: R26.89, R29.898, M51.36 1 Device 0  . atorvastatin (LIPITOR) 40 MG tablet Take 1 tablet (40 mg total) by mouth daily at 6 PM. Patient needs to schedule a follow up appointment with PCP before more refills. 90 tablet 0  . Bismuth Tribromoph-Petrolatum (XEROFORM PETROLAT PATCH 4"X4") PADS Apply to irritated skin daily, replace daily as needed. 30 each 1  . clobetasol ointment (TEMOVATE) 0.05 % Apply 1 application topically 2 (two) times daily. (Patient taking differently: Apply 1 application topically as directed. ) 45 g 3  . clonazePAM (KLONOPIN) 1 MG tablet TAKE ONE-HALF TO 1 TABLET BY MOUTH  TWICE DAILY AS NEEDED FOR ANXIETY 30 tablet 0  . cyclobenzaprine (FLEXERIL) 5 MG tablet Take 1 tablet (5 mg total) by mouth at bedtime as needed for muscle spasms. 30 tablet 0  . dabigatran (PRADAXA) 150 MG CAPS capsule Take 1 capsule (150 mg total) by mouth 2 (two) times daily. 180 capsule 3  . DULoxetine (CYMBALTA) 60 MG capsule Take 1 capsule (60 mg total) by mouth daily. Due for follow up visit with PCP 30 capsule 0  . glimepiride (AMARYL) 4 MG tablet TAKE 1 TABLET BY MOUTH TWICE DAILY WITH MEALS 180 tablet 0  . glucose blood (ONE TOUCH ULTRA TEST) test strip Use to test blood sugar 2 times daily as instructed. Dx code: 250.02 300 each 3  . HYDROcodone-acetaminophen (NORCO/VICODIN) 5-325 MG tablet Take 1 tablet by mouth  every 8 (eight) hours as needed for moderate pain. 30 tablet 0  . lisinopril (PRINIVIL,ZESTRIL) 2.5 MG tablet TAKE 1 TABLET BY MOUTH ONCE DAILY 30 tablet 6  . metoprolol tartrate (LOPRESSOR) 100 MG tablet Take 1 tablet (100 mg total) by mouth 2 (two) times daily. 180 tablet 1  . Saxagliptin-Metformin (KOMBIGLYZE XR) 2.03-999 MG TB24 Take 2 tablets by mouth at dinner time daily. 180 tablet 0  . torsemide (DEMADEX) 10 MG tablet Take 1 tablet (10 mg total) by mouth daily. 30 tablet 1   No current facility-administered medications for this visit.    Allergies  Allergen Reactions  . Glipizide     diarrhea  . Myrbetriq [Mirabegron]     Could not get a deep breath.   . Voltaren [Diclofenac Sodium]     Rash       Objective:  BP 128/71   Pulse 88   Temp 99.2 F (37.3 C) (Oral)   SpO2 91%  Gen: well-groomed, cooperative, not ill-appearing, no distress, morbidly obese female seated in electric wheelchair HEENT: head normocephalic, atraumatic; conjunctiva and cornea clear, trachea midline Pulm: Normal work of breathing, normal phonation, breath sounds are diminished and coarse diffusely, no wheezes, rales or rhonchi CV: Normal rate, regular rhythm, s1 and s2 distinct, no murmurs, clicks or rubs  GI: abdomen obese, soft, nontender Neuro: alert and oriented x 3, no tremor MSK: extremities atraumatic, 3+ pitting peripheral edema of bilateral lower extremities Skin: warm, dry, intact; chronic venous stasis dermatitis of distal lower extremities, no cyanosis Psych: good eye contact, euthymic mood, affect mood-congruent, normal speech and thought content   No results found for this or any previous visit (from the past 72 hour(s)). No results found. Transthoracic Echocardiography  Patient:    Bonnie, Clark MR #:       [id number] Study Date: 08/19/2016 Gender:     F Age:        69 Height:     170.2 cm Weight:     158.8 kg BSA:        2.84 m^2 Pt. Status: Room:   Bonnie Gong  Clark  REFERRING    Bonnie Clark  SONOGRAPHER  Bonnie Clark, [place]  ATTENDING    Bonnie Clark, M.D.  PERFORMING   [facility], Outpatient  cc:  ------------------------------------------------------------------- LV EF: 50% -   55%  ------------------------------------------------------------------- Indications:      Atrial fibrillation (I48.2). - Procedure narrative: Transthoracic echocardiography. Image   quality was suboptimal. The study was technically difficult, as a   result of poor acoustic windows, poor sound wave transmission,   and body habitus. Intravenous contrast (Definity) was   administered. -  Left ventricle: The cavity size was normal. There was mild   concentric hypertrophy. Systolic function was normal. The   estimated ejection fraction was in the range of 50% to 55%. Wall   motion was normal; there were no regional wall motion   abnormalities. The study was not technically sufficient to allow   evaluation of LV diastolic dysfunction due to atrial   fibrillation. - Aortic valve: Trileaflet; normal thickness leaflets. There was no   regurgitation. - Aortic root: The aortic root was normal in size. - Mitral valve: There was trivial regurgitation. - Left atrium: The atrium was mildly dilated. - Right ventricle: The cavity size was normal. Wall thickness was   normal. Systolic function was normal. - Right atrium: The atrium was normal in size. - Tricuspid valve: There was mild regurgitation. - Pulmonary arteries: Systolic pressure was mildly increased. PA   peak pressure: 38 mm Hg (S). - Inferior vena cava: The vessel was normal in size. - Pericardium, extracardiac: There was no pericardial effusion.  ECG 08/06/2017 Vent Rate 96 bpm PR-I n/a QRS 84 ms QT/QTc 310/391 Atrial fibrillation, Low voltage QRS, right axis  Assessment and Plan: 76 y.o. female with   1. Pulmonary edema cardiac cause [facility]) - personally reviewed chest x-ray, which showed  cardiomegaly, pulmonary congestion and kerley b line on the left, no infiltrate - reviewed Echo from 08/20/2016, which showed EF of 50-55% - suspect acute on chronic pulmonary edema/heart failure with preserved ejection fraction given pulmonary congestion on chest x-ray today. Checking stat BNP - Increase Torsemide to 1 tab twice a day - Limit sodium to <1500 mg per day - Limit fluid to 2 liters per day - close follow-up with PCP on Monday or Tuesday - Schedule a follow-up with cardiologist, Dr. Jens Som - torsemide (DEMADEX) 10 MG tablet; Take 1 tablet (10 mg total) by mouth 2 (two) times daily.  Dispense: 30 tablet; Refill: 0  2. Shortness of breath - DG Chest 2 View; Future - B Nat Peptide  3. Low oxygen saturation - continuous pulse ox showed saturations of 88-94% on RA at rest. Respirations are unlabored. No cyanosis. No evidence of respiratory distress - CBC with Differential/Platelet - COMPLETE METABOLIC PANEL WITH GFR - DG Chest 2 View; Future  4. Lower leg edema - Torsemide as above - elevate - limit sodium  - fluid restriction to 2L  5. Venous stasis dermatitis of both lower extremities - patient is out of unna boot today. No evidence of cellulitis  6. Atrial fibrillation - personally reviewed ECG today which showed afib without RVR and right axis devation - patient is anticoagulated with Pradaxa  Patient education and anticipatory guidance given Patient agrees with treatment plan Follow-up in 4 days   I spent 40 minutes with this patient, greater than 50% was face-to-face time counseling regarding the above diagnoses  Levonne Hubert PA-C

## 2017-08-07 ENCOUNTER — Encounter: Payer: Self-pay | Admitting: Physician Assistant

## 2017-08-07 DIAGNOSIS — N183 Chronic kidney disease, stage 3 unspecified: Secondary | ICD-10-CM | POA: Insufficient documentation

## 2017-08-07 DIAGNOSIS — D649 Anemia, unspecified: Secondary | ICD-10-CM

## 2017-08-07 DIAGNOSIS — R0602 Shortness of breath: Secondary | ICD-10-CM | POA: Insufficient documentation

## 2017-08-07 HISTORY — DX: Chronic kidney disease, stage 3 unspecified: N18.30

## 2017-08-07 HISTORY — DX: Anemia, unspecified: D64.9

## 2017-08-07 LAB — BRAIN NATRIURETIC PEPTIDE: BRAIN NATRIURETIC PEPTIDE: 77 pg/mL (ref <100)

## 2017-08-07 NOTE — Progress Notes (Signed)
Hi Bonnie Clark,  Your labs show a few things 1. A mild anemia 2. Low blood sugar 3. Decreased kidney function  We are going to adjust the plan a little bit. Recommend only taking the Torsemide once daily. I also want you to hold your glimeperide (diabetes pill) until you follow-up next week.  Try to take small sips of clear liquids throughout the day and eat small, frequent snacks instead of meals to maintain your blood sugar if you are not feeling up for a normal diet yet.   I am going to call you to discuss this plan as well.  Best, [name]

## 2017-08-10 ENCOUNTER — Ambulatory Visit: Payer: Medicare Other

## 2017-08-10 ENCOUNTER — Ambulatory Visit: Payer: Medicare Other | Admitting: Physician Assistant

## 2017-08-10 ENCOUNTER — Other Ambulatory Visit: Payer: Self-pay | Admitting: Physician Assistant

## 2017-08-11 ENCOUNTER — Encounter: Payer: Self-pay | Admitting: Physician Assistant

## 2017-08-11 ENCOUNTER — Ambulatory Visit (INDEPENDENT_AMBULATORY_CARE_PROVIDER_SITE_OTHER): Payer: Medicare Other | Admitting: Physician Assistant

## 2017-08-11 VITALS — BP 123/58 | HR 71

## 2017-08-11 DIAGNOSIS — J811 Chronic pulmonary edema: Secondary | ICD-10-CM

## 2017-08-11 DIAGNOSIS — D649 Anemia, unspecified: Secondary | ICD-10-CM | POA: Diagnosis not present

## 2017-08-11 DIAGNOSIS — I83001 Varicose veins of unspecified lower extremity with ulcer of thigh: Secondary | ICD-10-CM

## 2017-08-11 DIAGNOSIS — F330 Major depressive disorder, recurrent, mild: Secondary | ICD-10-CM | POA: Diagnosis not present

## 2017-08-11 DIAGNOSIS — N3281 Overactive bladder: Secondary | ICD-10-CM

## 2017-08-11 DIAGNOSIS — I429 Cardiomyopathy, unspecified: Secondary | ICD-10-CM | POA: Diagnosis not present

## 2017-08-11 DIAGNOSIS — R0602 Shortness of breath: Secondary | ICD-10-CM

## 2017-08-11 DIAGNOSIS — N183 Chronic kidney disease, stage 3 unspecified: Secondary | ICD-10-CM

## 2017-08-11 DIAGNOSIS — L97101 Non-pressure chronic ulcer of unspecified thigh limited to breakdown of skin: Secondary | ICD-10-CM | POA: Diagnosis not present

## 2017-08-11 DIAGNOSIS — L89322 Pressure ulcer of left buttock, stage 2: Secondary | ICD-10-CM

## 2017-08-11 MED ORDER — DULOXETINE HCL 60 MG PO CPEP: 60.0000 mg | ORAL_CAPSULE | Freq: Every day | ORAL | 5 refills | Status: DC

## 2017-08-11 MED ORDER — OXYBUTYNIN CHLORIDE ER 5 MG PO TB24: 5.0000 mg | ORAL_TABLET | Freq: Every day | ORAL | 5 refills | Status: DC

## 2017-08-11 NOTE — Progress Notes (Signed)
Subjective:    Patient ID: Bonnie Clark, female    DOB: 07-Feb-1941, 76 y.o.   MRN: [id number]  HPI  Pt is a 76 yo female who presents to the clinic to follow up one week after visit with [name] where she was having SOB, fatigue and dizziness. They suspected it was due to some pulmonary edema however when they got labs back does not appear to be fluid overloaded. It came out while talking with patient that she suddenly stopped cymbalta just before her symptoms start because she ran out. She is feeling much better now. Low hgb found on labs.   She has ongoing issue of OAB. She has frequent urination and urgency. She tried myberiq but did not like the way it made her feel. She is having to wear diapers during the day due to leakage.   Venous stasis- has been coming in every week to have unna boots placed.   She is having some left buttock pain. She sits all day long. She is trying to stay off of it. She has not done anything to make better.   .. Active Ambulatory Problems    Diagnosis Date Noted  . Depression 09/11/2011  . Arthritis   . Type 2 diabetes mellitus (HCC)   . Hyperlipidemia   . Hypertension   . Thyroid disease   . Morbid obesity (HCC) 09/11/2011  . Chronic insomnia 09/11/2011  . Chronic pain 09/11/2011  . Fibromyalgia 09/11/2011  . Anxiety and depression 09/11/2011  . Fatigue 09/11/2011  . Gout 09/11/2011  . Gait disorder 12/21/2011  . Atrial fibrillation 03/19/2012  . Cardiomyopathy (HCC) 04/21/2012  . Peripheral edema 06/21/2012  . OAB (overactive bladder) 06/21/2012  . Hypersomnolence 06/21/2012  . Other malaise and fatigue 02/14/2013  . Pre-ulcerative corn or callous 02/14/2013  . Osteoarthritis of both hips 10/11/2013  . Lumbar degenerative disc disease 10/11/2013  . Renal insufficiency 03/15/2014  . Venous stasis dermatitis of both lower extremities 04/03/2014  . Physical deconditioning 05/31/2014  . Ganglion cyst 03/12/2015  . Venous stasis ulcer of thigh  limited to breakdown of skin with varicose veins (HCC) 12/16/2016  . Balance problems 03/06/2017  . Weakness of lower extremity 03/06/2017  . Cellulitis of lower extremity 05/13/2017  . Lower leg edema 05/13/2017  . Primary osteoarthritis of left shoulder 06/08/2017  . Mass of forearm, left 06/08/2017  . Acute on chronic congestive heart failure (HCC) 08/06/2017  . CKD (chronic kidney disease) stage 3, GFR 30-59 ml/min 08/07/2017  . Normocytic anemia 08/07/2017  . Shortness of breath 08/07/2017  . Low hemoglobin 08/11/2017  . Chronic pulmonary edema 08/13/2017   Resolved Ambulatory Problems    Diagnosis Date Noted  . Spinal stenosis 09/11/2011  . Sciatica of right side 09/11/2011  . Chest pain 03/05/2012  . Urinary incontinence 03/05/2012  . Abscess of heel, left 02/14/2013  . Osteoarthritis of right hip 01/30/2014  . Pressure ulcer stage II 03/12/2015  . Olecranon bursitis of left elbow 06/29/2015   Past Medical History:  Diagnosis Date  . Anxiety and depression 09/11/2011  . Arthritis   . Atrial fibrillation (HCC)   . Chronic insomnia 09/11/2011  . CKD (chronic kidney disease) stage 3, GFR 30-59 ml/min 08/07/2017  . Diabetes mellitus   . Fibromyalgia 09/11/2011  . Gait disorder 12/21/2011  . Gout 09/11/2011  . Hyperlipidemia   . Hypertension   . Normocytic anemia 08/07/2017  . OAB (overactive bladder) 06/21/2012  . Obesity 09/11/2011  . Peripheral edema 06/21/2012  .  Sciatica of right side 09/11/2011  . Spinal stenosis 09/11/2011  . Thyroid disease   . Urine incontinence      Review of Systems  Constitutional: Negative.   Respiratory: Negative.   Cardiovascular: Negative.   Hematological: Negative.        Objective:   Physical Exam  Constitutional: She is oriented to person, place, and time. She appears well-developed and well-nourished.  Morbidly obese.   HENT:  Head: Normocephalic and atraumatic.  Eyes: Conjunctivae are normal. Right eye exhibits no  discharge. Left eye exhibits no discharge.  Cardiovascular: Normal rate, regular rhythm and normal heart sounds.   Pulmonary/Chest: Effort normal and breath sounds normal. She has no wheezes.  Abdominal: Soft. Bowel sounds are normal. She exhibits no distension and no mass. There is no tenderness. There is no rebound and no guarding.  Neurological: She is alert and oriented to person, place, and time.  Skin:  Right buttocks: size of nickel stage II ulcer. Has not gone past fat.   Bilateral edema of lower ext with anterior leg erythema. No active skin break down.           Assessment & Plan:  [name] KitchenMarland KitchenDiagnoses and all orders for this visit:  SOB (shortness of breath)  Low hemoglobin -     CBC -     Ferritin  CKD (chronic kidney disease) stage 3, GFR 30-59 ml/min -     BASIC METABOLIC PANEL WITH GFR  OAB (overactive bladder) -     oxybutynin (DITROPAN-XL) 5 MG 24 hr tablet; Take 1 tablet (5 mg total) by mouth at bedtime.  Cardiomyopathy, unspecified type (HCC)  Chronic pulmonary edema  Decubitus ulcer of left buttock, stage 2  Venous stasis ulcer of thigh limited to breakdown of skin with varicose veins, unspecified laterality (HCC)  Mild episode of recurrent major depressive disorder (HCC) -     DULoxetine (CYMBALTA) 60 MG capsule; Take 1 capsule (60 mg total) by mouth daily.   I think last weeks reaction was due to stopping cymbalta. Restart cymbalta.   Will recheck labs today.   For OAB will try oxybutynin. Discussed side effects.  HO given.   Unna boots placed today. Continue to use diuretics as needed.   duoderm placed over ulcer today. Recheck in 1 week. Keep weight off left buttocks.

## 2017-08-11 NOTE — Patient Instructions (Signed)
Follow up on Monday with labs.

## 2017-08-13 ENCOUNTER — Telehealth: Payer: Self-pay | Admitting: Cardiology

## 2017-08-13 ENCOUNTER — Encounter: Payer: Self-pay | Admitting: Physician Assistant

## 2017-08-13 ENCOUNTER — Other Ambulatory Visit: Payer: Self-pay | Admitting: Physician Assistant

## 2017-08-13 DIAGNOSIS — E1122 Type 2 diabetes mellitus with diabetic chronic kidney disease: Secondary | ICD-10-CM

## 2017-08-13 DIAGNOSIS — N183 Chronic kidney disease, stage 3 unspecified: Secondary | ICD-10-CM

## 2017-08-13 DIAGNOSIS — L89322 Pressure ulcer of left buttock, stage 2: Secondary | ICD-10-CM | POA: Insufficient documentation

## 2017-08-13 DIAGNOSIS — J811 Chronic pulmonary edema: Secondary | ICD-10-CM | POA: Insufficient documentation

## 2017-08-13 NOTE — Telephone Encounter (Signed)
Left message for pt to call.

## 2017-08-13 NOTE — Telephone Encounter (Signed)
Please call,he said pt needs to be seen.Her Orthopedic said she needs to see her Cardiologist.

## 2017-08-17 ENCOUNTER — Ambulatory Visit: Payer: Medicare Other

## 2017-08-18 ENCOUNTER — Ambulatory Visit (INDEPENDENT_AMBULATORY_CARE_PROVIDER_SITE_OTHER): Payer: Medicare Other | Admitting: Physician Assistant

## 2017-08-18 VITALS — BP 108/58 | HR 55

## 2017-08-18 DIAGNOSIS — I872 Venous insufficiency (chronic) (peripheral): Secondary | ICD-10-CM

## 2017-08-18 NOTE — Progress Notes (Signed)
Pt came into clinic today for unna boot application. Pt had bilateral unna boots applied last week, she removed them at home yesterday. No visible open wounds, some mild redness, edema present. New unna boot applied, foot included. Pt advised to return to clinic in one week for unna boot change. No further questions.   Agree with above plan. Tandy Gaw PA-C

## 2017-08-19 NOTE — Telephone Encounter (Signed)
Left message for pt to call.

## 2017-08-25 ENCOUNTER — Ambulatory Visit (INDEPENDENT_AMBULATORY_CARE_PROVIDER_SITE_OTHER): Payer: Medicare Other | Admitting: Physician Assistant

## 2017-08-25 VITALS — BP 123/64 | HR 71

## 2017-08-25 DIAGNOSIS — I872 Venous insufficiency (chronic) (peripheral): Secondary | ICD-10-CM

## 2017-08-25 DIAGNOSIS — Z23 Encounter for immunization: Secondary | ICD-10-CM

## 2017-08-25 NOTE — Progress Notes (Signed)
Pt came into clinic today for unna boot application. Pt had bilateral unna boots applied last week, she removed them at home yesterday. No visible open wounds, some mild redness, minimal edema present. New unna boot applied, foot included. Pt advised to return to clinic in one week for unna boot change. No further questions.  Patient also wanted to get her flu vaccination. Patient was given a flu shot questionnaire prior to administration of immunization. All questions were answered no. Patient tolerated injection of flu immunization in right deltoid well, with no immediate complications. An information pamphlet regarding flu shot immunization and frequently asked questions was given to Patient prior to leaving clinic. Advised to contact our office with any questions/concerns.    Agree with above plan. Tandy Gaw PA-C

## 2017-08-27 NOTE — Addendum Note (Signed)
Addended by: Collie Siad on: 08/27/2017 02:42 PM   Modules accepted: Orders

## 2017-09-01 ENCOUNTER — Ambulatory Visit: Payer: Medicare Other

## 2017-09-04 ENCOUNTER — Ambulatory Visit (INDEPENDENT_AMBULATORY_CARE_PROVIDER_SITE_OTHER): Payer: Medicare Other | Admitting: Family Medicine

## 2017-09-04 VITALS — BP 132/67 | HR 72

## 2017-09-04 DIAGNOSIS — I872 Venous insufficiency (chronic) (peripheral): Secondary | ICD-10-CM | POA: Diagnosis not present

## 2017-09-04 DIAGNOSIS — S80822A Blister (nonthermal), left lower leg, initial encounter: Secondary | ICD-10-CM | POA: Diagnosis not present

## 2017-09-04 NOTE — Progress Notes (Signed)
Venous stasis dermatitis-overall significantly improved she still has some chronic pink changes of the skin as well as some peau d'orange changes. . She has a  2 x 4 cm blister on the left inner calf.  No broken skin or wounds.   Applied UNA boots bilatearlly. Explained that she is about ready to transition to compression stockings. In fact she could go ahead and transition for her right lower leg since it looks great today. She says she doesn't know where they are but we'll try to look for them or either try to get a new pair between now and next week. She says she usually has to have her husband help her with those but I think that's perfectly fine we need to have a more long-term plan and that would be the stockings.

## 2017-09-04 NOTE — Progress Notes (Signed)
Pt came into clinic today for unna boot change. Pt removed her bilateral unna boots at home last night. Overall, legs look the same. Some mild lower extremity redness. There is one blister on inner lower left leg. Provider able to assess legs prior to new unna boot application. Pt advised to return in one week for change.

## 2017-09-08 ENCOUNTER — Ambulatory Visit: Payer: Medicare Other

## 2017-09-10 ENCOUNTER — Ambulatory Visit: Payer: Medicare Other

## 2017-09-11 ENCOUNTER — Ambulatory Visit: Payer: Medicare Other

## 2017-09-14 ENCOUNTER — Encounter: Payer: Self-pay | Admitting: Sports Medicine

## 2017-09-14 ENCOUNTER — Ambulatory Visit (INDEPENDENT_AMBULATORY_CARE_PROVIDER_SITE_OTHER): Payer: Medicare Other | Admitting: Sports Medicine

## 2017-09-14 DIAGNOSIS — I83001 Varicose veins of unspecified lower extremity with ulcer of thigh: Secondary | ICD-10-CM

## 2017-09-14 DIAGNOSIS — L97101 Non-pressure chronic ulcer of unspecified thigh limited to breakdown of skin: Secondary | ICD-10-CM | POA: Diagnosis not present

## 2017-09-14 DIAGNOSIS — M19012 Primary osteoarthritis, left shoulder: Secondary | ICD-10-CM | POA: Diagnosis not present

## 2017-09-14 NOTE — Assessment & Plan Note (Signed)
Improving appearance of left leg venous stasis ulcer, bilateral Unna boots placed today. Return in one week and a nurse visit.

## 2017-09-14 NOTE — Assessment & Plan Note (Signed)
3.5 month response to previous injection, repeat left glenohumeral injection as above.

## 2017-09-14 NOTE — Progress Notes (Signed)
  Subjective:    CC: shoulder pain, venous stasis dermatitis  HPI: Primary osteoarthritis of left shoulder: Previous glenohumeral joint injection provided good relief 3-1/2 months ago, having recurrence of pain, moderate, persistent, localized over the joint lines without radiation past her elbow.  Worse with most movements.   Venous stasis dermatitis:  Due for repeat Unna boot placement  Past medical history:  Negative.  See flowsheet/record as well for more information.  Surgical history: Negative.  See flowsheet/record as well for more information.  Family history: Negative.  See flowsheet/record as well for more information.  Social history: Negative.  See flowsheet/record as well for more information.  Allergies, and medications have been entered into the medical record, reviewed, and no changes needed.   Review of Systems: No fevers, chills, night sweats, weight loss, chest pain, or shortness of breath.   Objective:    General: Well Developed, well nourished, and in no acute distress.  Neuro: Alert and oriented x3, extra-ocular muscles intact, sensation grossly intact.  HEENT: Normocephalic, atraumatic, pupils equal round reactive to light, neck supple, no masses, no lymphadenopathy, thyroid nonpalpable.  Skin: Warm and dry, no rashes. Cardiac: Regular rate and rhythm, no murmurs rubs or gallops, no lower extremity edema.  Respiratory: Clear to auscultation bilaterally. Not using accessory muscles, speaking in full sentences. Left shoulder: Pain with abduction, external rotation. Legs: morbidly obese, hemosiderin deposits around the mid lower leg, superficial venous stasis ulcer on the left medial lower leg.  Bilateral Unna boots placed.  Procedure: Real-time Ultrasound Guided Injection of left glenohumeral joint Device: GE Logiq E  Verbal informed consent obtained.  Time-out conducted.  Noted no overlying erythema, induration, or other signs of local infection.  Skin prepped  in a sterile fashion.  Local anesthesia: Topical Ethyl chloride.  With sterile technique and under real time ultrasound guidance:  Using a 22-gauge spinal needle advanced into the joint and injected 1 mL kenalog 40, 2 mL lidocaine, 2 mL bupivacaine. Completed without difficulty  Pain immediately resolved suggesting accurate placement of the medication.  Advised to call if fevers/chills, erythema, induration, drainage, or persistent bleeding.  Images permanently stored and available for review in the ultrasound unit.  Impression: Technically successful ultrasound guided injection.  Impression and Recommendations:    Primary osteoarthritis of left shoulder 3.5 month response to previous injection, repeat left glenohumeral injection as above.  Venous stasis ulcer of thigh limited to breakdown of skin with varicose veins (HCC) Improving appearance of left leg venous stasis ulcer, bilateral Unna boots placed today. Return in one week and a nurse visit. ___________________________________________ Ihor Austin. Benjamin Stain, M.D., ABFM., CAQSM. Primary Care and Sports Medicine [facility]  Adjunct Instructor of Family Medicine  [employer]

## 2017-09-21 ENCOUNTER — Ambulatory Visit (INDEPENDENT_AMBULATORY_CARE_PROVIDER_SITE_OTHER): Payer: Medicare Other | Admitting: Sports Medicine

## 2017-09-21 DIAGNOSIS — L97101 Non-pressure chronic ulcer of unspecified thigh limited to breakdown of skin: Secondary | ICD-10-CM

## 2017-09-21 DIAGNOSIS — I83001 Varicose veins of unspecified lower extremity with ulcer of thigh: Secondary | ICD-10-CM | POA: Diagnosis not present

## 2017-09-21 MED ORDER — AMBULATORY NON FORMULARY MEDICATION: 0 refills | Status: AC

## 2017-09-21 NOTE — Assessment & Plan Note (Signed)
1 single additional week of Unna boot, venous stasis ulcer has completely resolved. After the next week I would like her in lower extremity compression stockings with a side zipper consistently. Prescription written for this.

## 2017-09-21 NOTE — Progress Notes (Signed)
Pt came into clinic today for bilateral unna boot change. Pt removed her bilateral unna boots at home last night. Overall, legs look better. Some mild lower extremity redness. There was one blister on inner lower left leg, this has resolved. Provider able to assess legs prior to new unna boot application. Pt advised to return in one week for change.

## 2017-09-28 ENCOUNTER — Ambulatory Visit: Payer: Medicare Other

## 2017-09-30 ENCOUNTER — Ambulatory Visit (INDEPENDENT_AMBULATORY_CARE_PROVIDER_SITE_OTHER): Payer: Medicare Other | Admitting: Physician Assistant

## 2017-09-30 VITALS — BP 120/66 | HR 60

## 2017-09-30 DIAGNOSIS — I83001 Varicose veins of unspecified lower extremity with ulcer of thigh: Secondary | ICD-10-CM

## 2017-09-30 DIAGNOSIS — L97101 Non-pressure chronic ulcer of unspecified thigh limited to breakdown of skin: Secondary | ICD-10-CM

## 2017-09-30 NOTE — Progress Notes (Signed)
Pt came into clinic today for bilateral unna boot change. Pt removed her bilateral unna boots at home last night. Overall, legs look better. Some mild lower extremity redness. Provider able to assess legs prior to new unna boot application. Pt advised to return in one week for change.   Discussed how compression stockings could keep her legs more maintained without unna boots every week. We gave her some resources on these. Agree with above plan. Tandy Gaw PA-C

## 2017-10-07 ENCOUNTER — Ambulatory Visit (INDEPENDENT_AMBULATORY_CARE_PROVIDER_SITE_OTHER): Payer: Medicare Other | Admitting: Physician Assistant

## 2017-10-07 VITALS — BP 104/59 | HR 71

## 2017-10-07 DIAGNOSIS — E785 Hyperlipidemia, unspecified: Secondary | ICD-10-CM | POA: Diagnosis not present

## 2017-10-07 DIAGNOSIS — L97101 Non-pressure chronic ulcer of unspecified thigh limited to breakdown of skin: Secondary | ICD-10-CM

## 2017-10-07 DIAGNOSIS — I83001 Varicose veins of unspecified lower extremity with ulcer of thigh: Secondary | ICD-10-CM

## 2017-10-07 DIAGNOSIS — I10 Essential (primary) hypertension: Secondary | ICD-10-CM

## 2017-10-07 MED ORDER — ATORVASTATIN CALCIUM 40 MG PO TABS: 40.0000 mg | ORAL_TABLET | Freq: Every day | ORAL | 1 refills | Status: DC

## 2017-10-07 NOTE — Progress Notes (Signed)
Pt came into clinic today forbilateralunna boot change. Pt removed her bilateral unna boots at home last night. Overall, legs look better. Some mild lower extremity redness. Provider able to assess legs prior to new unna boot application. Pt advised to return in one week for change.Pt request refill on Lipitor, per PCP lab orders and refill sent. Next appt made with Provider, she is due for DM follow up.  Measured lower leg circumference for TED hose. Left: 23.5" in diameter, 15" tall. Right: 23" in diameter, 15" tall.   Agree with above plan. Tandy Gaw PA-C

## 2017-10-08 NOTE — Progress Notes (Signed)
Sent website link to Pt in [employer].

## 2017-10-08 NOTE — Progress Notes (Signed)
Attempted to contact Pt, no answer and VM full. Found some TED hose online that meet the qualifications:   [email or web address]

## 2017-10-12 ENCOUNTER — Other Ambulatory Visit: Payer: Self-pay | Admitting: Physician Assistant

## 2017-10-12 DIAGNOSIS — E1122 Type 2 diabetes mellitus with diabetic chronic kidney disease: Secondary | ICD-10-CM

## 2017-10-12 DIAGNOSIS — N183 Chronic kidney disease, stage 3 (moderate): Principal | ICD-10-CM

## 2017-10-12 DIAGNOSIS — E785 Hyperlipidemia, unspecified: Secondary | ICD-10-CM | POA: Diagnosis not present

## 2017-10-12 DIAGNOSIS — I10 Essential (primary) hypertension: Secondary | ICD-10-CM | POA: Diagnosis not present

## 2017-10-12 LAB — COMPLETE METABOLIC PANEL WITH GFR
AG RATIO: 1.5 (calc) (ref 1.0–2.5)
ALKALINE PHOSPHATASE (APISO): 78 U/L (ref 33–130)
ALT: 13 U/L (ref 6–29)
AST: 13 U/L (ref 10–35)
Albumin: 3.7 g/dL (ref 3.6–5.1)
BILIRUBIN TOTAL: 0.3 mg/dL (ref 0.2–1.2)
BUN / CREAT RATIO: 20 (calc) (ref 6–22)
BUN: 30 mg/dL — AB (ref 7–25)
CHLORIDE: 106 mmol/L (ref 98–110)
CO2: 20 mmol/L (ref 20–32)
Calcium: 9.2 mg/dL (ref 8.6–10.4)
Creat: 1.51 mg/dL — ABNORMAL HIGH (ref 0.60–0.93)
GFR, Est African American: 39 mL/min/{1.73_m2} — ABNORMAL LOW (ref >=60)
GFR, Est Non African American: 33 mL/min/{1.73_m2} — ABNORMAL LOW (ref >=60)
GLUCOSE: 151 mg/dL — AB (ref 65–99)
Globulin: 2.5 g/dL (calc) (ref 1.9–3.7)
POTASSIUM: 4.9 mmol/L (ref 3.5–5.3)
Sodium: 138 mmol/L (ref 135–146)
Total Protein: 6.2 g/dL (ref 6.1–8.1)

## 2017-10-12 LAB — LIPID PANEL
Cholesterol: 128 mg/dL (ref <200)
HDL: 44 mg/dL — ABNORMAL LOW (ref >50)
LDL CHOLESTEROL (CALC): 60 mg/dL
Non-HDL Cholesterol (Calc): 84 mg/dL (calc) (ref <130)
Total CHOL/HDL Ratio: 2.9 (calc) (ref <5.0)
Triglycerides: 166 mg/dL — ABNORMAL HIGH (ref <150)

## 2017-10-13 ENCOUNTER — Encounter: Payer: Self-pay | Admitting: Physician Assistant

## 2017-10-13 ENCOUNTER — Ambulatory Visit (INDEPENDENT_AMBULATORY_CARE_PROVIDER_SITE_OTHER): Payer: Medicare Other | Admitting: Physician Assistant

## 2017-10-13 VITALS — BP 95/68 | HR 80

## 2017-10-13 DIAGNOSIS — E1122 Type 2 diabetes mellitus with diabetic chronic kidney disease: Secondary | ICD-10-CM | POA: Diagnosis not present

## 2017-10-13 DIAGNOSIS — Z23 Encounter for immunization: Secondary | ICD-10-CM

## 2017-10-13 DIAGNOSIS — L97101 Non-pressure chronic ulcer of unspecified thigh limited to breakdown of skin: Secondary | ICD-10-CM | POA: Diagnosis not present

## 2017-10-13 DIAGNOSIS — M19012 Primary osteoarthritis, left shoulder: Secondary | ICD-10-CM

## 2017-10-13 DIAGNOSIS — R29898 Other symptoms and signs involving the musculoskeletal system: Secondary | ICD-10-CM | POA: Diagnosis not present

## 2017-10-13 DIAGNOSIS — N183 Chronic kidney disease, stage 3 unspecified: Secondary | ICD-10-CM

## 2017-10-13 DIAGNOSIS — I83001 Varicose veins of unspecified lower extremity with ulcer of thigh: Secondary | ICD-10-CM | POA: Diagnosis not present

## 2017-10-13 LAB — POCT GLYCOSYLATED HEMOGLOBIN (HGB A1C): Hemoglobin A1C: 7.2

## 2017-10-13 MED ORDER — GLIMEPIRIDE 4 MG PO TABS: 4.0000 mg | ORAL_TABLET | Freq: Two times a day (BID) | ORAL | 1 refills | Status: DC

## 2017-10-13 MED ORDER — DICLOFENAC SODIUM 1 % TD GEL: 4.0000 g | Freq: Four times a day (QID) | TRANSDERMAL | 5 refills | Status: DC

## 2017-10-13 MED ORDER — DULAGLUTIDE 0.75 MG/0.5ML ~~LOC~~ SOAJ: 1.0000 "application " | SUBCUTANEOUS | 2 refills | Status: DC

## 2017-10-13 MED ORDER — SAXAGLIPTIN-METFORMIN ER 2.5-1000 MG PO TB24: ORAL_TABLET | ORAL | 1 refills | Status: DC

## 2017-10-13 NOTE — Progress Notes (Signed)
Subjective:    Patient ID: Bonnie Clark, female    DOB: 1941-09-17, 76 y.o.   MRN: [id number]  HPI  Pt is a 76 yo morbidly obese female with DM, HTN, pulmonary edema, afib, and chronic venous stasis uclers that presents to the clinic for DM management.   DM- pt admits to not checking sugars. She also does not regularly have her a1c checked. She is not watching carbs or exercising at this time as well. She is on kombiglyze and glimepridie daily. She denies any hypoglycemic symptoms. She does have ongoing chronic edema with skin breakdown in bilateral legs. Currently we are applying unna boots weekly to keep under control. She is actively looking for ted hose big enough to fit her legs.   Pt has ongoing left shoulder pain due to OA. She sees Dr. Karie Schwalbe for injections and treatment. Pain is worsening and feels like ROM is decreasing as well as strength. Pt states norco does not help. She cannot take NSAIDs due to CKD.   Pt would also like to discuss paperwork for power wheelchair. Her chair is 75 years old and was told she should be able to get another one. Home care has rx but needs paperwork.   .. Active Ambulatory Problems    Diagnosis Date Noted  . Depression 09/11/2011  . Arthritis   . Type 2 diabetes mellitus (HCC)   . Hyperlipidemia   . Hypertension   . Thyroid disease   . Morbid obesity (HCC) 09/11/2011  . Chronic insomnia 09/11/2011  . Chronic pain 09/11/2011  . Fibromyalgia 09/11/2011  . Anxiety and depression 09/11/2011  . Fatigue 09/11/2011  . Gout 09/11/2011  . Gait disorder 12/21/2011  . Atrial fibrillation 03/19/2012  . Cardiomyopathy (HCC) 04/21/2012  . Peripheral edema 06/21/2012  . OAB (overactive bladder) 06/21/2012  . Hypersomnolence 06/21/2012  . Other malaise and fatigue 02/14/2013  . Pre-ulcerative corn or callous 02/14/2013  . Osteoarthritis of both hips 10/11/2013  . Lumbar degenerative disc disease 10/11/2013  . Renal insufficiency 03/15/2014  . Venous stasis  dermatitis of both lower extremities 04/03/2014  . Physical deconditioning 05/31/2014  . Ganglion cyst 03/12/2015  . Venous stasis ulcer of thigh limited to breakdown of skin with varicose veins (HCC) 12/16/2016  . Balance problems 03/06/2017  . Weakness of lower extremity 03/06/2017  . Cellulitis of lower extremity 05/13/2017  . Lower leg edema 05/13/2017  . Primary osteoarthritis of left shoulder 06/08/2017  . Mass of forearm, left 06/08/2017  . Acute on chronic congestive heart failure (HCC) 08/06/2017  . CKD (chronic kidney disease) stage 3, GFR 30-59 ml/min (HCC) 08/07/2017  . Normocytic anemia 08/07/2017  . SOB (shortness of breath) 08/07/2017  . Low hemoglobin 08/11/2017  . Chronic pulmonary edema 08/13/2017  . Decubitus ulcer of left buttock, stage 2 08/13/2017   Resolved Ambulatory Problems    Diagnosis Date Noted  . Spinal stenosis 09/11/2011  . Sciatica of right side 09/11/2011  . Chest pain 03/05/2012  . Urinary incontinence 03/05/2012  . Abscess of heel, left 02/14/2013  . Osteoarthritis of right hip 01/30/2014  . Pressure ulcer stage II 03/12/2015  . Olecranon bursitis of left elbow 06/29/2015   Past Medical History:  Diagnosis Date  . Anxiety and depression 09/11/2011  . Arthritis   . Atrial fibrillation (HCC)   . Chronic insomnia 09/11/2011  . CKD (chronic kidney disease) stage 3, GFR 30-59 ml/min (HCC) 08/07/2017  . Diabetes mellitus   . Fibromyalgia 09/11/2011  . Gait disorder 12/21/2011  .  Gout 09/11/2011  . Hyperlipidemia   . Hypertension   . Normocytic anemia 08/07/2017  . OAB (overactive bladder) 06/21/2012  . Obesity 09/11/2011  . Peripheral edema 06/21/2012  . Sciatica of right side 09/11/2011  . Spinal stenosis 09/11/2011  . Thyroid disease   . Urine incontinence         Review of Systems    see HPI.  Objective:   Physical Exam  Constitutional: She is oriented to person, place, and time. She appears well-developed and well-nourished.   Morbidly obese. Not ambulating in power wheelchair.   HENT:  Head: Normocephalic and atraumatic.  Cardiovascular: Normal rate, regular rhythm and normal heart sounds.  Pulmonary/Chest: Effort normal and breath sounds normal. She has no wheezes.  Musculoskeletal:  Left shoulder ROM decrease to 80 degrees abduction.   Neurological: She is alert and oriented to person, place, and time.  Skin:  1 plus pitting edema bilateral legs up to knees. Areas of erythema over anterior bilateral legs. No warmth, blisters, lesion, open sores today.   Psychiatric: She has a normal mood and affect. Her behavior is normal.          Assessment & Plan:  [name] KitchenMarland KitchenKambell was seen today for diabetes.  Diagnoses and all orders for this visit:  Type 2 diabetes mellitus with stage 3 chronic kidney disease, without long-term current use of insulin (HCC) -     POCT HgB A1C -     Dulaglutide (TRULICITY) 0.75 MG/0.5ML SOPN; Inject 1 application into the skin once a week. -     glimepiride (AMARYL) 4 MG tablet; Take 1 tablet (4 mg total) by mouth 2 (two) times daily with a meal. -     Saxagliptin-Metformin (KOMBIGLYZE XR) 2.03-999 MG TB24; TAKE TWO TABLETS BY MOUTH ONCE DAILY AT DINNER TIME  Venous stasis ulcer of thigh limited to breakdown of skin with varicose veins, unspecified laterality (HCC)  CKD (chronic kidney disease) stage 3, GFR 30-59 ml/min (HCC)  Need for pneumococcal vaccination -     Pneumococcal conjugate vaccine 13-valent  Morbidly obese (HCC)  Weakness of both lower extremities  Primary osteoarthritis of left shoulder -     diclofenac sodium (VOLTAREN) 1 % GEL; Apply 4 g topically 4 (four) times daily.     .. Lab Results  Component Value Date   HGBA1C 7.2 10/13/2017   A!C not to goal.  Added trulicity. Pt denies any personal hx of pancreatitis or thyroid cancer.  Discussed how to use and side effects.  On ACE/STATIN/Pradaxa Up to date on flu shot.  prevnar given in office today.   Discussed shingrix. HO given. Pt will consider.  Follow up in 3 months.   Wrapped in [employer]. Follow up in 1 week. Kelsi, LPN, found a reasource for TED HOSE and patient ordered today. Goal would be to decrease frequency of unna boot placements.   Follow up with Dr. Karie Schwalbe regarding left shoulder pain. rom decreased signifcantly today. Question frozen shoulder setting in.  Diclofenac given to apply as needed for pain.   Pt needs [place] to call office about power wheelchair. [phone] [facility].

## 2017-10-17 DIAGNOSIS — R29898 Other symptoms and signs involving the musculoskeletal system: Secondary | ICD-10-CM | POA: Insufficient documentation

## 2017-10-17 IMAGING — DX DG CHEST 2V
2 series · 2 of 2 positions shown · non-contrast
Comparison: Chest x-ray of 01/31/2014

CLINICAL DATA: Fever, loss of appetite, malaise

EXAM:
CHEST  2 VIEW

[chest lat]
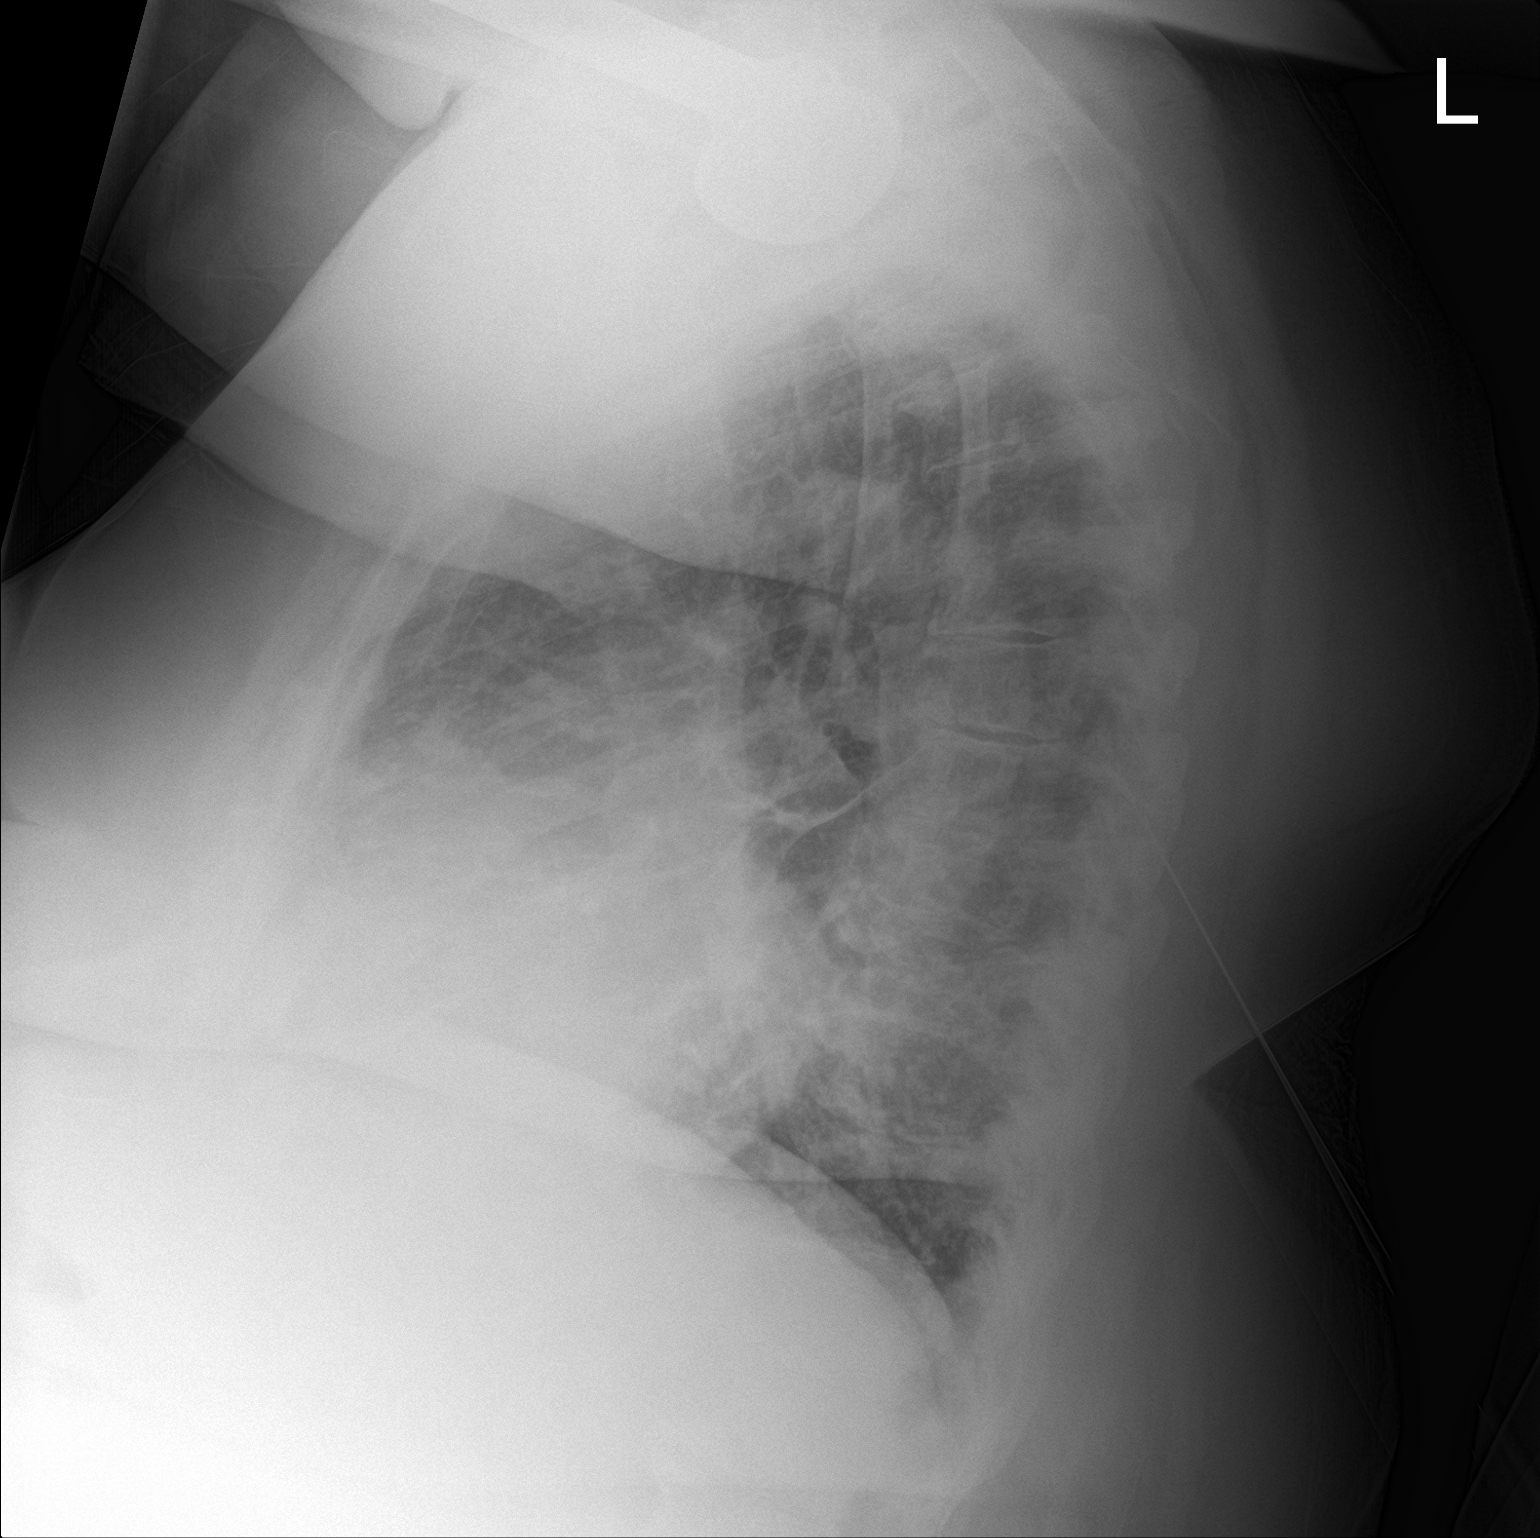

[chest ap]
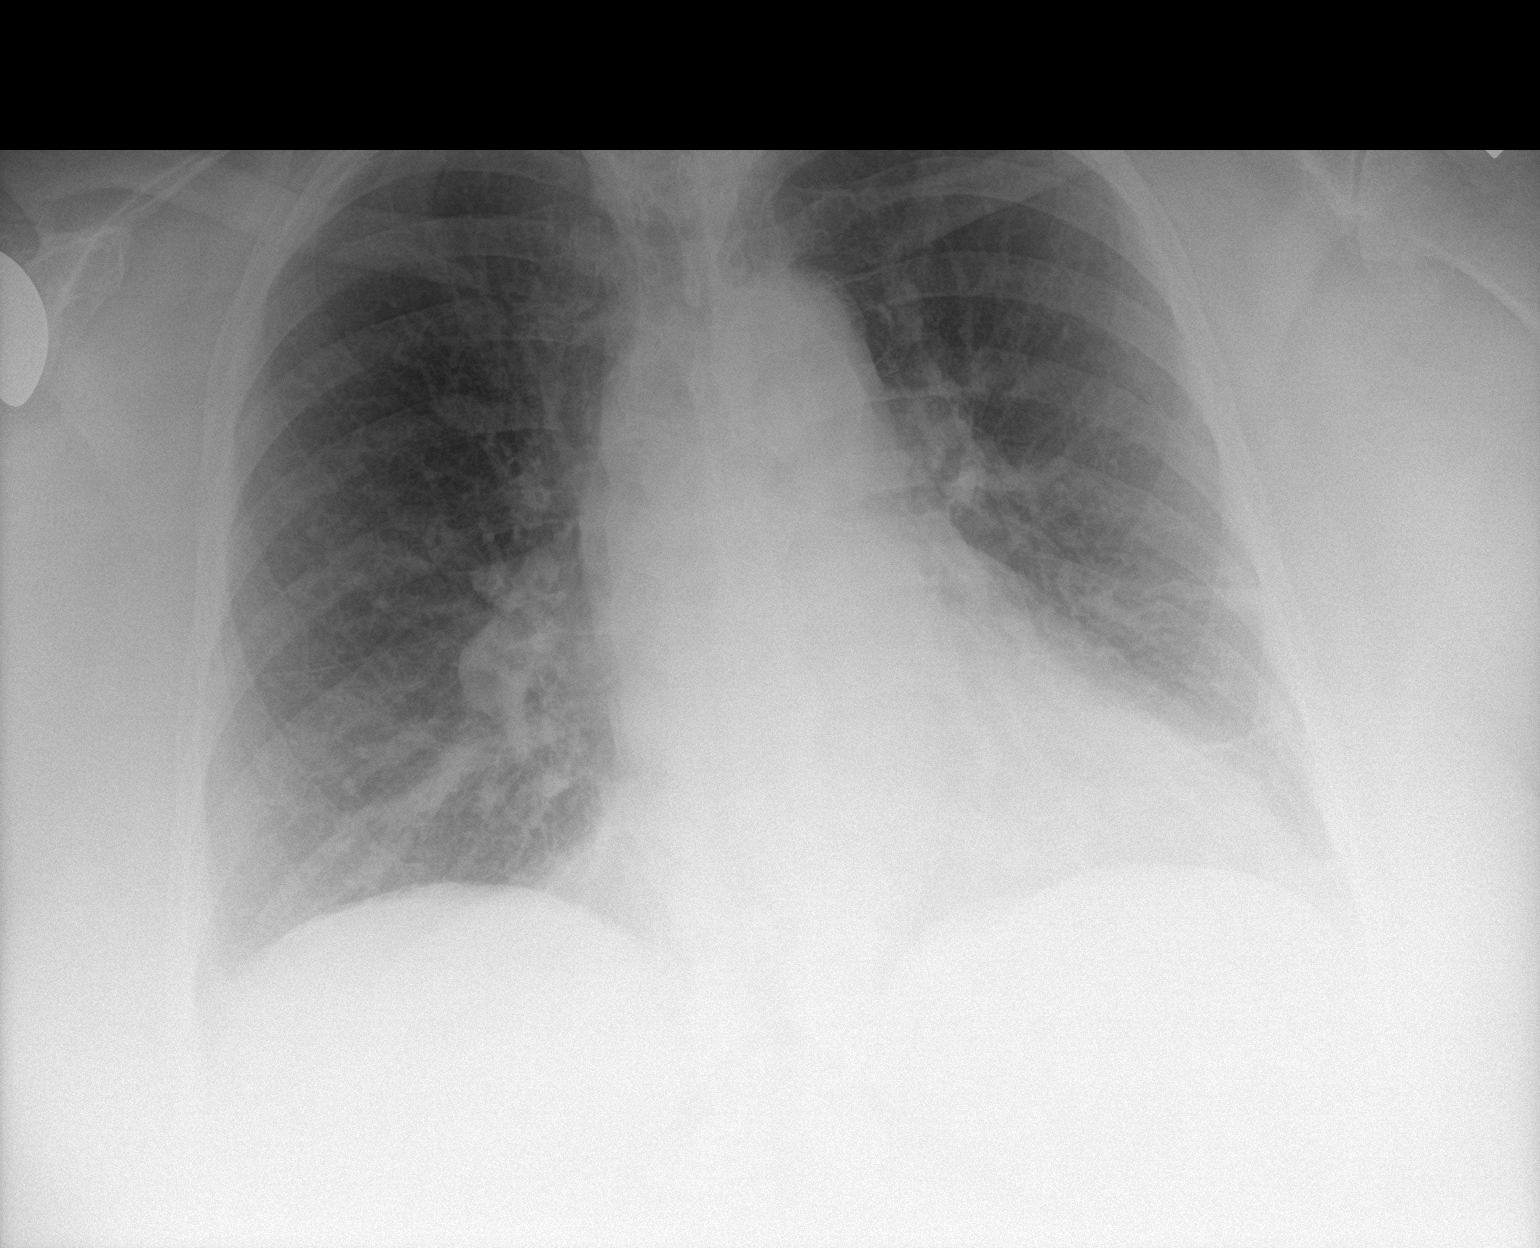

[2 of 2 positions shown; findings below may reference images not displayed]

FINDINGS: No pneumonia or effusion is seen. Mild linear atelectasis or
scarring is noted at the left lung base. Mediastinal and hilar
contours are unremarkable. Mild cardiomegaly is stable. Very minimal
pulmonary vascular congestion cannot be excluded. No acute bony
abnormality is seen.
IMPRESSION: Stable cardiomegaly. Cannot exclude very mild pulmonary vascular
congestion.

## 2017-10-17 NOTE — Telephone Encounter (Signed)
Pt needs new power wheelchair. Apparently there is specific information with face to face that needs to be done for this to be approved. Can we get that paperwork so that this can be done. [facility]. [phone]

## 2017-10-20 ENCOUNTER — Ambulatory Visit (INDEPENDENT_AMBULATORY_CARE_PROVIDER_SITE_OTHER): Payer: Medicare Other | Admitting: Sports Medicine

## 2017-10-20 ENCOUNTER — Encounter: Payer: Self-pay | Admitting: Sports Medicine

## 2017-10-20 DIAGNOSIS — M19012 Primary osteoarthritis, left shoulder: Secondary | ICD-10-CM | POA: Diagnosis not present

## 2017-10-20 MED ORDER — PREGABALIN 50 MG PO CAPS: ORAL_CAPSULE | ORAL | 3 refills | Status: DC

## 2017-10-20 NOTE — Telephone Encounter (Signed)
Attempted to contact Pt to see which company she uses for her Wheelchair.

## 2017-10-20 NOTE — Assessment & Plan Note (Signed)
History of right shoulder arthroplasty. Left glenohumeral injection did not provide relief this time, she is not interested in surgical intervention. Thus we are going to consider tweaking her medications. I am going to add Lyrica 50 mg and a slow up taper. Certainly going up on Cymbalta could help, last resort would be referral to pain management for medical management of her end-stage inoperable glenohumeral osteoarthritis.

## 2017-10-20 NOTE — Progress Notes (Signed)
  Subjective:    CC: Recheck shoulder  HPI: Bonnie Clark returns, she has end-stage inoperable left glenohumeral osteoarthritis and is post right total shoulder arthroplasty.  We did an injection into the glenohumeral joint under ultrasound guidance the last visit, left-sided, unfortunately she only got a few days of relief and has recurrence of pain, she refuses any mention of operative intervention.  She is agreeable to proceed with medical management.  Past medical history:  Negative.  See flowsheet/record as well for more information.  Surgical history: Negative.  See flowsheet/record as well for more information.  Family history: Negative.  See flowsheet/record as well for more information.  Social history: Negative.  See flowsheet/record as well for more information.  Allergies, and medications have been entered into the medical record, reviewed, and no changes needed.   Review of Systems: No fevers, chills, night sweats, weight loss, chest pain, or shortness of breath.   Objective:    General: Well Developed, well nourished, and in no acute distress.  Neuro: Alert and oriented x3, extra-ocular muscles intact, sensation grossly intact.  HEENT: Normocephalic, atraumatic, pupils equal round reactive to light, neck supple, no masses, no lymphadenopathy, thyroid nonpalpable.  Skin: Warm and dry, no rashes. Cardiac: Regular rate and rhythm, no murmurs rubs or gallops, no lower extremity edema.  Respiratory: Clear to auscultation bilaterally. Not using accessory muscles, speaking in full sentences.  Impression and Recommendations:    Primary osteoarthritis of left shoulder History of right shoulder arthroplasty. Left glenohumeral injection did not provide relief this time, she is not interested in surgical intervention. Thus we are going to consider tweaking her medications. I am going to add Lyrica 50 mg and a slow up taper. Certainly going up on Cymbalta could help, last resort would be  referral to pain management for medical management of her end-stage inoperable glenohumeral osteoarthritis.  I spent 40 minutes with this patient, greater than 50% was face-to-face time counseling regarding the above diagnoses ___________________________________________ Ihor Austin. Benjamin Stain, M.D., ABFM., CAQSM. Primary Care and Sports Medicine [place] [facility]  Adjunct Instructor of Family Medicine  [employer]

## 2017-10-21 ENCOUNTER — Other Ambulatory Visit: Payer: Self-pay

## 2017-10-21 DIAGNOSIS — R29898 Other symptoms and signs involving the musculoskeletal system: Secondary | ICD-10-CM

## 2017-10-21 DIAGNOSIS — R2689 Other abnormalities of gait and mobility: Secondary | ICD-10-CM

## 2017-10-21 DIAGNOSIS — M5136 Other intervertebral disc degeneration, lumbar region: Secondary | ICD-10-CM

## 2017-10-21 MED ORDER — AMBULATORY NON FORMULARY MEDICATION: 0 refills | Status: DC

## 2017-10-21 NOTE — Progress Notes (Signed)
Wheelchair order printed and faxed to [facility].

## 2017-10-22 NOTE — Telephone Encounter (Signed)
Letter written and placed on desk.

## 2017-10-22 NOTE — Telephone Encounter (Signed)
Pt OK to send order to [facility]. Waiting on letter from PCP to send with it.

## 2017-10-22 NOTE — Telephone Encounter (Signed)
Information faxed

## 2017-10-30 ENCOUNTER — Other Ambulatory Visit: Payer: Self-pay | Admitting: *Deleted

## 2017-10-30 MED ORDER — GLUCOSE BLOOD VI STRP: ORAL_STRIP | 3 refills | Status: AC

## 2017-11-09 ENCOUNTER — Other Ambulatory Visit: Payer: Self-pay | Admitting: Cardiology

## 2017-11-09 NOTE — Telephone Encounter (Signed)
REFILL 

## 2017-11-12 ENCOUNTER — Ambulatory Visit: Payer: Medicare Other | Admitting: Sports Medicine

## 2017-11-13 ENCOUNTER — Encounter: Payer: Self-pay | Admitting: Sports Medicine

## 2017-11-13 ENCOUNTER — Ambulatory Visit (INDEPENDENT_AMBULATORY_CARE_PROVIDER_SITE_OTHER): Payer: Medicare Other | Admitting: Sports Medicine

## 2017-11-13 DIAGNOSIS — M19012 Primary osteoarthritis, left shoulder: Secondary | ICD-10-CM

## 2017-11-13 DIAGNOSIS — F330 Major depressive disorder, recurrent, mild: Secondary | ICD-10-CM | POA: Diagnosis not present

## 2017-11-13 DIAGNOSIS — M797 Fibromyalgia: Secondary | ICD-10-CM

## 2017-11-13 MED ORDER — DULOXETINE HCL 60 MG PO CPEP: 120.0000 mg | ORAL_CAPSULE | Freq: Every day | ORAL | 3 refills | Status: DC

## 2017-11-13 MED ORDER — PREGABALIN 100 MG PO CAPS: 100.0000 mg | ORAL_CAPSULE | Freq: Two times a day (BID) | ORAL | 3 refills | Status: DC

## 2017-11-13 NOTE — Progress Notes (Signed)
  Subjective:    CC: Chronic pain syndrome  HPI: Returns, she has fibromyalgia, chronic pain, minimal improvement with 50 of the Lyrica, she is on 60 of Cymbalta.  We have been trying multiple interventions none of which have been dramatically effective so she understands that at this point we were going to try more of a medical approach.  Past medical history:  Negative.  See flowsheet/record as well for more information.  Surgical history: Negative.  See flowsheet/record as well for more information.  Family history: Negative.  See flowsheet/record as well for more information.  Social history: Negative.  See flowsheet/record as well for more information.  Allergies, and medications have been entered into the medical record, reviewed, and no changes needed.   (To billers/coders, pertinent past medical, social, surgical, family history can be found in problem list, if problem list is marked as reviewed then this indicates that past medical, social, surgical, family history was also reviewed)  Review of Systems: No fevers, chills, night sweats, weight loss, chest pain, or shortness of breath.   Objective:    General: Well Developed, well nourished, and in no acute distress.  Neuro: Alert and oriented x3, extra-ocular muscles intact, sensation grossly intact.  HEENT: Normocephalic, atraumatic, pupils equal round reactive to light, neck supple, no masses, no lymphadenopathy, thyroid nonpalpable.  Skin: Warm and dry, no rashes. Cardiac: Regular rate and rhythm, no murmurs rubs or gallops, no lower extremity edema.  Respiratory: Clear to auscultation bilaterally. Not using accessory muscles, speaking in full sentences.  Impression and Recommendations:    Fibromyalgia We are going to take a different approach, I am avoiding interventions for now and for a while. Increasing Cymbalta to 120 mg, increasing Lyrica to 100 mg twice a day. Return in a month.  I spent 25 minutes with this  patient, greater than 50% was face-to-face time counseling regarding the above diagnoses ___________________________________________ Ihor Austin. Benjamin Stain, M.D., ABFM., CAQSM. Primary Care and Sports Medicine [place] [facility]  Adjunct Instructor of Family Medicine  [employer]

## 2017-11-13 NOTE — Assessment & Plan Note (Signed)
We are going to take a different approach, I am avoiding interventions for now and for a while. Increasing Cymbalta to 120 mg, increasing Lyrica to 100 mg twice a day. Return in a month.

## 2017-11-23 ENCOUNTER — Other Ambulatory Visit: Payer: Self-pay | Admitting: Cardiology

## 2017-11-30 ENCOUNTER — Ambulatory Visit (INDEPENDENT_AMBULATORY_CARE_PROVIDER_SITE_OTHER): Payer: Medicare Other | Admitting: Physician Assistant

## 2017-11-30 ENCOUNTER — Encounter: Payer: Self-pay | Admitting: Physician Assistant

## 2017-11-30 VITALS — BP 127/61 | HR 66

## 2017-11-30 DIAGNOSIS — I429 Cardiomyopathy, unspecified: Secondary | ICD-10-CM | POA: Diagnosis not present

## 2017-11-30 DIAGNOSIS — R609 Edema, unspecified: Secondary | ICD-10-CM

## 2017-11-30 DIAGNOSIS — I872 Venous insufficiency (chronic) (peripheral): Secondary | ICD-10-CM | POA: Diagnosis not present

## 2017-11-30 DIAGNOSIS — R600 Localized edema: Secondary | ICD-10-CM

## 2017-11-30 NOTE — Progress Notes (Signed)
Subjective:    Patient ID: Bonnie Clark, female    DOB: [birth year], 77 y.o.   MRN: [id number]  HPI  Pt is a 77 yo morbidly obese female with CHF, DM type II, afib, chronic pain who presents to the clinic to follow up on her exacerbation of chronic venous statis/lower extremity edema with some skin breakdown. She had been getting unna boots every week. We thought that if we could get good compression stockings she would not need unna boots. Her last unna boot was 11/14. She has not been able to regularly use the compression stockings and now her legs have worsened. Bilateral anterior legs are swollen, erythematous, blisters that are draining and crusting over. No fever, chills, body aches, n/v/d.   .. Active Ambulatory Problems    Diagnosis Date Noted  . Depression 09/11/2011  . Arthritis   . Type 2 diabetes mellitus (HCC)   . Hyperlipidemia   . Hypertension   . Thyroid disease   . Morbidly obese (HCC) 09/11/2011  . Chronic insomnia 09/11/2011  . Chronic pain 09/11/2011  . Fibromyalgia 09/11/2011  . Anxiety and depression 09/11/2011  . Fatigue 09/11/2011  . Gout 09/11/2011  . Gait disorder 12/21/2011  . Atrial fibrillation 03/19/2012  . Cardiomyopathy (HCC) 04/21/2012  . Peripheral edema 06/21/2012  . OAB (overactive bladder) 06/21/2012  . Hypersomnolence 06/21/2012  . Other malaise and fatigue 02/14/2013  . Pre-ulcerative corn or callous 02/14/2013  . Osteoarthritis of both hips 10/11/2013  . Lumbar degenerative disc disease 10/11/2013  . Renal insufficiency 03/15/2014  . Venous stasis dermatitis of both lower extremities 04/03/2014  . Physical deconditioning 05/31/2014  . Ganglion cyst 03/12/2015  . Venous stasis ulcer of thigh limited to breakdown of skin with varicose veins (HCC) 12/16/2016  . Balance problems 03/06/2017  . Weakness of lower extremity 03/06/2017  . Cellulitis of lower extremity 05/13/2017  . Lower leg edema 05/13/2017  . Primary osteoarthritis of left  shoulder 06/08/2017  . Mass of forearm, left 06/08/2017  . Acute on chronic congestive heart failure (HCC) 08/06/2017  . CKD (chronic kidney disease) stage 3, GFR 30-59 ml/min (HCC) 08/07/2017  . Normocytic anemia 08/07/2017  . SOB (shortness of breath) 08/07/2017  . Low hemoglobin 08/11/2017  . Chronic pulmonary edema 08/13/2017  . Decubitus ulcer of left buttock, stage 2 08/13/2017  . Weakness of both lower extremities 10/17/2017   Resolved Ambulatory Problems    Diagnosis Date Noted  . Spinal stenosis 09/11/2011  . Sciatica of right side 09/11/2011  . Chest pain 03/05/2012  . Urinary incontinence 03/05/2012  . Abscess of heel, left 02/14/2013  . Osteoarthritis of right hip 01/30/2014  . Pressure ulcer stage II 03/12/2015  . Olecranon bursitis of left elbow 06/29/2015   Past Medical History:  Diagnosis Date  . Anxiety and depression 09/11/2011  . Arthritis   . Atrial fibrillation (HCC)   . Chronic insomnia 09/11/2011  . CKD (chronic kidney disease) stage 3, GFR 30-59 ml/min (HCC) 08/07/2017  . Diabetes mellitus   . Fibromyalgia 09/11/2011  . Gait disorder 12/21/2011  . Gout 09/11/2011  . Hyperlipidemia   . Hypertension   . Normocytic anemia 08/07/2017  . OAB (overactive bladder) 06/21/2012  . Obesity 09/11/2011  . Peripheral edema 06/21/2012  . Sciatica of right side 09/11/2011  . Spinal stenosis 09/11/2011  . Thyroid disease   . Urine incontinence      Review of Systems  All other systems reviewed and are negative.      Objective:  Physical Exam  Constitutional: She is oriented to person, place, and time.  Morbidly obese in power wheelchair.   HENT:  Head: Normocephalic and atraumatic.  Cardiovascular: Normal rate, regular rhythm and normal heart sounds.  Neurological: She is alert and oriented to person, place, and time.  Skin:  Bilateral pitting edema with anterior leg erythema and scattered draining sores with some crusted over.   Psychiatric: She has a  normal mood and affect. Her behavior is normal.          Assessment & Plan:  [name] KitchenMarland KitchenDiagnoses and all orders for this visit:  Venous stasis dermatitis of both lower extremities  Cardiomyopathy, unspecified type (HCC)  Peripheral edema  Edema with potential skin breakdown   Placed back in unna boots today. Wear for 1 week then follow up. Goal will be to get unna boot and then wear compression stockings and repeat every 2 weeks. Pt's legs always worsen if she doesn't regularly get unna boot. Continue to use diruretic. I did not see any need for abx at this time.

## 2017-12-03 ENCOUNTER — Other Ambulatory Visit: Payer: Self-pay | Admitting: Physician Assistant

## 2017-12-07 ENCOUNTER — Ambulatory Visit: Payer: Medicare Other

## 2017-12-08 ENCOUNTER — Ambulatory Visit: Payer: Medicare Other | Admitting: Physician Assistant

## 2017-12-08 ENCOUNTER — Ambulatory Visit (INDEPENDENT_AMBULATORY_CARE_PROVIDER_SITE_OTHER): Payer: Medicare Other | Admitting: Physician Assistant

## 2017-12-08 VITALS — BP 125/77 | HR 72

## 2017-12-08 DIAGNOSIS — N183 Chronic kidney disease, stage 3 (moderate): Secondary | ICD-10-CM

## 2017-12-08 DIAGNOSIS — E1122 Type 2 diabetes mellitus with diabetic chronic kidney disease: Secondary | ICD-10-CM | POA: Diagnosis not present

## 2017-12-08 DIAGNOSIS — I872 Venous insufficiency (chronic) (peripheral): Secondary | ICD-10-CM

## 2017-12-08 MED ORDER — GLIMEPIRIDE 4 MG PO TABS: 4.0000 mg | ORAL_TABLET | Freq: Two times a day (BID) | ORAL | 1 refills | Status: DC

## 2017-12-08 NOTE — Progress Notes (Addendum)
Pt came into clinic today for unna boot application. PCP was able to look at Pt's legs, both lower extremities has blisters - some of which were weeping. Per PCP, new unna boot and ace wraps were applied. Pt advised to return in one week for reapplication.   Pt needs a new power wheelchair. She is very dependent on the wheelchair to get around. She does not ambulate very well. She will need wheelchair for the rest of her life. She has very limited strength in legs and morbidly obese.   Ordered placed for wheelchair.   Agree with above plan. Tandy Gaw PA-C

## 2017-12-11 ENCOUNTER — Ambulatory Visit (INDEPENDENT_AMBULATORY_CARE_PROVIDER_SITE_OTHER): Payer: Medicare Other | Admitting: Sports Medicine

## 2017-12-11 ENCOUNTER — Encounter: Payer: Self-pay | Admitting: Sports Medicine

## 2017-12-11 DIAGNOSIS — M19012 Primary osteoarthritis, left shoulder: Secondary | ICD-10-CM | POA: Diagnosis not present

## 2017-12-11 DIAGNOSIS — M797 Fibromyalgia: Secondary | ICD-10-CM | POA: Diagnosis not present

## 2017-12-11 DIAGNOSIS — I872 Venous insufficiency (chronic) (peripheral): Secondary | ICD-10-CM

## 2017-12-11 MED ORDER — PREGABALIN 100 MG PO CAPS: 100.0000 mg | ORAL_CAPSULE | Freq: Three times a day (TID) | ORAL | 3 refills | Status: DC

## 2017-12-11 MED ORDER — TRAMADOL HCL 50 MG PO TABS: 50.0000 mg | ORAL_TABLET | Freq: Three times a day (TID) | ORAL | 1 refills | Status: DC

## 2017-12-11 NOTE — Assessment & Plan Note (Addendum)
I am replacing her Unna boots bilaterally, we did get the Unna boot wrap in finally. She needs to cancel her Tuesday Unna boot appointment and come back next Friday.

## 2017-12-11 NOTE — Assessment & Plan Note (Addendum)
We are doing a medical approach, avoiding interventions for now. Increasing Cymbalta to 120 mg provided good relief, I am going up on her Lyrica now to 100 mg 3 times a day. I am also going to add tramadol for use 3 times a day. If insufficient relief we will go up to Lyrica 200 mg 3 times a day, this will be at the 600 mg max daily dose. Return in 1 month.

## 2017-12-11 NOTE — Progress Notes (Signed)
Subjective:    CC: Recheck new meds  HPI: Latayna Clenney returns, she is a pleasant 77 year old female with chronic pain syndrome, fibromyalgia, I increased her Cymbalta to 120 mg last time and we doubled her Lyrica to 100 mg twice a day.  She has noted good improvement in her pain, still has occasional flares.  She also has bilateral venous stasis dermatitis with ulcerations, she had Unna boots placed on Tuesday.  We did not have an Unna boot so it was only placed on her mid lower leg.  She is not surprisingly having increasing swelling in both of her feet.   I reviewed the past medical history, family history, social history, surgical history, and allergies today and no changes were needed.  Please see the problem list section below in epic for further details.  Past Medical History: Past Medical History:  Diagnosis Date  . Anxiety and depression 09/11/2011  . Arthritis   . Atrial fibrillation (HCC)   . Chronic insomnia 09/11/2011  . CKD (chronic kidney disease) stage 3, GFR 30-59 ml/min (HCC) 08/07/2017  . Diabetes mellitus   . Fibromyalgia 09/11/2011  . Gait disorder 12/21/2011  . Gout 09/11/2011  . Hyperlipidemia   . Hypertension   . Normocytic anemia 08/07/2017  . OAB (overactive bladder) 06/21/2012  . Obesity 09/11/2011  . Peripheral edema 06/21/2012  . Sciatica of right side 09/11/2011  . Spinal stenosis 09/11/2011  . Thyroid disease   . Urine incontinence    Past Surgical History: Past Surgical History:  Procedure Laterality Date  . REPLACEMENT TOTAL KNEE BILATERAL     right approx 1994, left approx 1999  . SHOULDER SURGERY     right - approx 2005  . THYROIDECTOMY, PARTIAL     at 77yo   Social History: Social History   Socioeconomic History  . Marital status: Married    Spouse name: None  . Number of children: 3  . Years of education: None  . Highest education level: None  Social Needs  . Financial resource strain: None  . Food insecurity - worry: None  . Food  insecurity - inability: None  . Transportation needs - medical: None  . Transportation needs - non-medical: None  Occupational History  . None  Tobacco Use  . Smoking status: Never Smoker  . Smokeless tobacco: Never Used  Substance and Sexual Activity  . Alcohol use: No  . Drug use: No  . Sexual activity: None  Other Topics Concern  . None  Social History Narrative  . None   Family History: Family History  Problem Relation Age of Onset  . Arthritis Mother   . Depression Mother   . Arthritis Father   . Stroke Sister    Allergies: Allergies  Allergen Reactions  . Glipizide     diarrhea  . Myrbetriq [Mirabegron]     Could not get a deep breath.   . Voltaren [Diclofenac Sodium]     Rash   Medications: See med rec.  Review of Systems: No fevers, chills, night sweats, weight loss, chest pain, or shortness of breath.   Objective:    General: Well Developed, well nourished, and in no acute distress.  Neuro: Alert and oriented x3, extra-ocular muscles intact, sensation grossly intact.  HEENT: Normocephalic, atraumatic, pupils equal round reactive to light, neck supple, no masses, no lymphadenopathy, thyroid nonpalpable.  Skin: Warm and dry, no rashes. Cardiac: Regular rate and rhythm, no murmurs rubs or gallops, no lower extremity edema.  Respiratory: Clear to  auscultation bilaterally. Not using accessory muscles, speaking in full sentences.  I removed and revised the Unna boots with our new supply, from the base of the toes to just below the knee, I used 2 rolls of Unna boot on each side as well as 2 6 inch Ace bandages.  She still has a few open ulcers but these look much better from before.  No signs of bacterial superinfection.  Impression and Recommendations:    Fibromyalgia We are doing a medical approach, avoiding interventions for now. Increasing Cymbalta to 120 mg provided good relief, I am going up on her Lyrica now to 100 mg 3 times a day. I am also going to  add tramadol for use 3 times a day. If insufficient relief we will go up to Lyrica 200 mg 3 times a day, this will be at the 600 mg max daily dose. Return in 1 month.  Venous stasis dermatitis of both lower extremities I am replacing her Unna boots bilaterally, we did get the Unna boot wrap in finally. She needs to cancel her Tuesday Unna boot appointment and come back next Friday.  I spent 40 minutes with this patient, greater than 50% was face-to-face time counseling regarding the above diagnoses, this was separate from the time spent performing the above procedure. ___________________________________________ Ihor Austin. Benjamin Stain, M.D., ABFM., CAQSM. Primary Care and Sports Medicine [place] [facility]  Adjunct Instructor of Family Medicine  [employer]

## 2017-12-15 ENCOUNTER — Ambulatory Visit: Payer: Medicare Other

## 2017-12-18 ENCOUNTER — Ambulatory Visit: Payer: Medicare Other

## 2017-12-30 ENCOUNTER — Ambulatory Visit: Payer: Medicare Other

## 2017-12-31 ENCOUNTER — Ambulatory Visit (INDEPENDENT_AMBULATORY_CARE_PROVIDER_SITE_OTHER): Payer: Medicare Other | Admitting: Sports Medicine

## 2017-12-31 ENCOUNTER — Ambulatory Visit: Payer: Medicare Other | Admitting: Sports Medicine

## 2017-12-31 VITALS — BP 110/67 | HR 73

## 2017-12-31 DIAGNOSIS — M19012 Primary osteoarthritis, left shoulder: Secondary | ICD-10-CM

## 2017-12-31 DIAGNOSIS — I872 Venous insufficiency (chronic) (peripheral): Secondary | ICD-10-CM | POA: Diagnosis not present

## 2017-12-31 MED ORDER — PREGABALIN 200 MG PO CAPS: 200.0000 mg | ORAL_CAPSULE | Freq: Three times a day (TID) | ORAL | 3 refills | Status: DC

## 2017-12-31 NOTE — Progress Notes (Signed)
Pt came into clinic today for unna boot application. Pt removed last unna boots this morning and cleaned her legs. There are some blisters on bilateral lower extremities. Physician able to view them, advised to apply bilateral unna boots. Pt tolerated application well. Advised to return in one week for change. No further questions.   I spent 15 minutes with this patient, greater than 50% was face-to-face time counseling regarding the above diagnoses, this was separate from the time spent performing the procedure. ___________________________________________ Ihor Austin. Benjamin Stain, M.D., ABFM., CAQSM. Primary Care and Sports Medicine [place] [facility]  Adjunct Instructor of Family Medicine  [employer]

## 2018-01-07 ENCOUNTER — Ambulatory Visit: Payer: Medicare Other

## 2018-01-08 ENCOUNTER — Ambulatory Visit: Payer: Medicare Other

## 2018-01-15 ENCOUNTER — Ambulatory Visit: Payer: Medicare Other | Admitting: Sports Medicine

## 2018-01-15 DIAGNOSIS — Z0189 Encounter for other specified special examinations: Secondary | ICD-10-CM

## 2018-01-20 ENCOUNTER — Ambulatory Visit (INDEPENDENT_AMBULATORY_CARE_PROVIDER_SITE_OTHER): Payer: Medicare Other | Admitting: Physician Assistant

## 2018-01-20 VITALS — BP 124/73 | HR 62

## 2018-01-20 DIAGNOSIS — I872 Venous insufficiency (chronic) (peripheral): Secondary | ICD-10-CM

## 2018-01-20 MED ORDER — TRAZODONE HCL 50 MG PO TABS: 25.0000 mg | ORAL_TABLET | Freq: Every evening | ORAL | 0 refills | Status: DC | PRN

## 2018-01-20 NOTE — Progress Notes (Signed)
Pt came into clinic today for unna boot application. Pt reports due to transportation issues, the last unna boots were removed at home 10 days ago. She tried to use her TED hose, but they did not help. Her legs are seeping today, right worse than left. Both lower extremities and feet are swollen. Provider able to view. It was recommended for Pt to start getting home health to change her dressings at home. Pt is not trilled about this idea, but is open to it after explaining the importance of getting her dressings changed every week. Pt also states she is not sleeping. PCP OK to sent Trazodone Rx to pharmacy.    I do not feel like legs are infected at this time. At one week home health nurse can assess. Call with any fever, chills. Agree with above plan. Tandy Gaw PA-C

## 2018-01-27 ENCOUNTER — Ambulatory Visit: Payer: Medicare Other | Admitting: Physician Assistant

## 2018-01-27 ENCOUNTER — Ambulatory Visit: Payer: Medicare Other

## 2018-02-04 ENCOUNTER — Other Ambulatory Visit: Payer: Self-pay | Admitting: Physician Assistant

## 2018-02-04 DIAGNOSIS — E1122 Type 2 diabetes mellitus with diabetic chronic kidney disease: Secondary | ICD-10-CM

## 2018-02-04 DIAGNOSIS — N183 Chronic kidney disease, stage 3 unspecified: Secondary | ICD-10-CM

## 2018-02-11 ENCOUNTER — Ambulatory Visit: Payer: Medicare Other

## 2018-02-15 ENCOUNTER — Other Ambulatory Visit: Payer: Self-pay | Admitting: Cardiology

## 2018-02-22 ENCOUNTER — Ambulatory Visit (INDEPENDENT_AMBULATORY_CARE_PROVIDER_SITE_OTHER): Payer: Medicare Other | Admitting: Physician Assistant

## 2018-02-22 VITALS — BP 111/65 | HR 84 | Temp 97.7°F

## 2018-02-22 DIAGNOSIS — I872 Venous insufficiency (chronic) (peripheral): Secondary | ICD-10-CM

## 2018-02-22 DIAGNOSIS — R609 Edema, unspecified: Secondary | ICD-10-CM | POA: Diagnosis not present

## 2018-02-22 DIAGNOSIS — M19012 Primary osteoarthritis, left shoulder: Secondary | ICD-10-CM

## 2018-02-22 MED ORDER — ALLOPURINOL 300 MG PO TABS: ORAL_TABLET | ORAL | 0 refills | Status: DC

## 2018-02-22 MED ORDER — PREGABALIN 200 MG PO CAPS: 200.0000 mg | ORAL_CAPSULE | Freq: Three times a day (TID) | ORAL | 3 refills | Status: DC

## 2018-02-22 NOTE — Progress Notes (Signed)
Pt presented today for unna boot dressing on bilateral lower extremities. Last unna dressing was 5 wks ago. As per pt, home health aide did not contact patient to set up care for wound  dressing. Pt was feeling ill for a few weeks and was unable to come in for new unna boot dressing. Lesions on pt's leg were seeping and she had visible edema. Provider was informed and met with patient. Pt's blood pressure reading was 111/65, pulse 84. Pt requested refills for allopurinol & lyrica. Medications pended for provider approval. Pt has been scheduled for 1 week for unna boot dressing.   Agree with above plan. Iran Planas PA-C

## 2018-02-23 ENCOUNTER — Other Ambulatory Visit: Payer: Self-pay | Admitting: Physician Assistant

## 2018-02-23 MED ORDER — SITAGLIP PHOS-METFORMIN HCL ER 100-1000 MG PO TB24: 1.0000 | ORAL_TABLET | Freq: Every day | ORAL | 2 refills | Status: DC

## 2018-02-23 MED ORDER — AMBULATORY NON FORMULARY MEDICATION
0 refills | Status: DC
Start: 2018-02-23 — End: 2018-02-23

## 2018-02-23 MED ORDER — AMBULATORY NON FORMULARY MEDICATION: 0 refills | Status: AC

## 2018-03-02 ENCOUNTER — Ambulatory Visit: Payer: Medicare Other

## 2018-03-04 ENCOUNTER — Ambulatory Visit (INDEPENDENT_AMBULATORY_CARE_PROVIDER_SITE_OTHER): Payer: Medicare Other | Admitting: Physician Assistant

## 2018-03-04 ENCOUNTER — Encounter: Payer: Self-pay | Admitting: Physician Assistant

## 2018-03-04 VITALS — BP 124/65 | HR 65

## 2018-03-04 DIAGNOSIS — I872 Venous insufficiency (chronic) (peripheral): Secondary | ICD-10-CM

## 2018-03-04 DIAGNOSIS — N183 Chronic kidney disease, stage 3 unspecified: Secondary | ICD-10-CM

## 2018-03-04 DIAGNOSIS — E1122 Type 2 diabetes mellitus with diabetic chronic kidney disease: Secondary | ICD-10-CM

## 2018-03-04 DIAGNOSIS — R251 Tremor, unspecified: Secondary | ICD-10-CM | POA: Insufficient documentation

## 2018-03-04 DIAGNOSIS — E162 Hypoglycemia, unspecified: Secondary | ICD-10-CM

## 2018-03-04 LAB — GLUCOSE, POCT (MANUAL RESULT ENTRY)
POC Glucose: 47 mg/dl — AB (ref 70–99)
POC Glucose: 62 mg/dl — AB (ref 70–99)

## 2018-03-04 MED ORDER — GLUCOSE 4 G PO CHEW
1.0000 | CHEWABLE_TABLET | Freq: Once | ORAL | Status: AC
  Administered 2018-03-04: 4 g via ORAL

## 2018-03-04 NOTE — Progress Notes (Signed)
Pt came into clinic today for unna boot application. Pt removed last unna boots this morning and cleaned her legs. There are some draining sores on bilateral lower extremities. Physician able to view them, advised to apply bilateral unna boots. Prior to application of unna boots, Pt requested some candy stating she felt her sugar was low. Checked POC Glucose, sugar was 47. One glucose tab and a mini pack of skittles given. Pt states she started "feeling better." Pt tolerated application well. Advised to return in one week for change. Rechecked POC Glucose, sugar up to 62. PCP advised Pt to leave and go get something to eat before doing anything else. Verbalized understanding. Pt needs to make sure she is eating small meals throughout the day. Follow up for DM check.  No further questions.   Agree with above plan. Tandy Gaw PA-C

## 2018-03-05 ENCOUNTER — Other Ambulatory Visit: Payer: Self-pay | Admitting: Physician Assistant

## 2018-03-05 ENCOUNTER — Other Ambulatory Visit: Payer: Self-pay

## 2018-03-05 DIAGNOSIS — E1122 Type 2 diabetes mellitus with diabetic chronic kidney disease: Secondary | ICD-10-CM

## 2018-03-05 DIAGNOSIS — N183 Chronic kidney disease, stage 3 unspecified: Secondary | ICD-10-CM

## 2018-03-05 MED ORDER — DULAGLUTIDE 0.75 MG/0.5ML ~~LOC~~ SOAJ: 1.0000 "application " | SUBCUTANEOUS | 0 refills | Status: DC

## 2018-03-11 ENCOUNTER — Ambulatory Visit (INDEPENDENT_AMBULATORY_CARE_PROVIDER_SITE_OTHER): Payer: Medicare Other | Admitting: Physician Assistant

## 2018-03-11 VITALS — BP 115/65 | HR 54

## 2018-03-11 DIAGNOSIS — I872 Venous insufficiency (chronic) (peripheral): Secondary | ICD-10-CM

## 2018-03-11 NOTE — Progress Notes (Signed)
Pt came into clinic today for unna boot application. Pt removed last unna boots last night and took a shower. There is still some redness, but bilateral lower extremities are no longer seeping. No visible blisters. Physician able to view them, advised to apply bilateral unna boots. Pt tolerated application well. Advised to return in one week for change. No further questions.

## 2018-03-18 ENCOUNTER — Ambulatory Visit: Payer: Medicare Other

## 2018-03-19 ENCOUNTER — Ambulatory Visit: Payer: Medicare Other

## 2018-03-22 ENCOUNTER — Ambulatory Visit (INDEPENDENT_AMBULATORY_CARE_PROVIDER_SITE_OTHER): Payer: Medicare Other | Admitting: Physician Assistant

## 2018-03-22 VITALS — BP 127/62 | HR 68

## 2018-03-22 DIAGNOSIS — I872 Venous insufficiency (chronic) (peripheral): Secondary | ICD-10-CM

## 2018-03-22 NOTE — Progress Notes (Signed)
Pt came into clinic today for unna boot application. Pt removed last unna boots last Thursday, when she was supposed to come in for an appointment. Due to transportation issues, she was unable to make it in until today. There is still some redness and a couple closed blisters, but bilateral lower extremities are no longer seeping. Physician able to view them, advised to apply bilateral unna boots. Pt tolerated application well. Advised to return in one week for change. No further questions.  Agree with above plan. Tandy Gaw PA-C

## 2018-04-01 ENCOUNTER — Ambulatory Visit (INDEPENDENT_AMBULATORY_CARE_PROVIDER_SITE_OTHER): Payer: Medicare Other | Admitting: Sports Medicine

## 2018-04-01 DIAGNOSIS — I872 Venous insufficiency (chronic) (peripheral): Secondary | ICD-10-CM | POA: Diagnosis not present

## 2018-04-01 NOTE — Progress Notes (Signed)
   Subjective:    Patient ID: Bonnie Clark, female    DOB: April 24, 1941, 77 y.o.   MRN: [id number]  HPI Patient here for una wraps to both legs; states has had previous wraps on for 9 days and took them off this morning. States no moisture/weeping or new blisters.   Review of Systems     Objective:   Physical Exam Observed by Dr.T       Assessment & Plan:  Another placement of ulna and ace wraps; re-evaluate as needed. Patient to call with report.

## 2018-04-11 ENCOUNTER — Other Ambulatory Visit: Payer: Self-pay | Admitting: Sports Medicine

## 2018-04-11 ENCOUNTER — Other Ambulatory Visit: Payer: Self-pay | Admitting: Physician Assistant

## 2018-04-11 DIAGNOSIS — F330 Major depressive disorder, recurrent, mild: Secondary | ICD-10-CM

## 2018-04-26 ENCOUNTER — Other Ambulatory Visit: Payer: Self-pay | Admitting: Physician Assistant

## 2018-04-26 DIAGNOSIS — N183 Chronic kidney disease, stage 3 unspecified: Secondary | ICD-10-CM

## 2018-04-26 DIAGNOSIS — E1122 Type 2 diabetes mellitus with diabetic chronic kidney disease: Secondary | ICD-10-CM

## 2018-05-06 ENCOUNTER — Ambulatory Visit: Payer: Medicare Other

## 2018-05-06 ENCOUNTER — Ambulatory Visit: Payer: Medicare Other | Admitting: Physician Assistant

## 2018-05-10 ENCOUNTER — Ambulatory Visit (INDEPENDENT_AMBULATORY_CARE_PROVIDER_SITE_OTHER): Payer: Medicare Other | Admitting: Physician Assistant

## 2018-05-10 VITALS — BP 117/67 | HR 67 | Temp 98.2°F

## 2018-05-10 DIAGNOSIS — I872 Venous insufficiency (chronic) (peripheral): Secondary | ICD-10-CM | POA: Diagnosis not present

## 2018-05-10 MED ORDER — HYDROCODONE-ACETAMINOPHEN 5-325 MG PO TABS: 1.0000 | ORAL_TABLET | Freq: Four times a day (QID) | ORAL | 0 refills | Status: AC | PRN

## 2018-05-10 MED ORDER — DOXYCYCLINE HYCLATE 100 MG PO TABS: 100.0000 mg | ORAL_TABLET | Freq: Two times a day (BID) | ORAL | 0 refills | Status: DC

## 2018-05-10 NOTE — Progress Notes (Signed)
Pt came into clinic today for unna boot application. Pt removed unna boot's at home last Wednesday prior to scheduled appointment. She came for appointment, but refused to have unna boot's applied based on who was available to apply them. Today in clinic, Pt's legs are both bloody and seeping. Pt has wound on both shins, worse on left than right. Per PCP, unna boot's to be applied. Antibiotic and pain Rx sent to pharmacy for Pt pick up. Pt advised to return to clinic in 1 week for reapplication. Verbalized understanding.   Sent doxycycline for 10 days and 5 days of norco for as needed pain. [place] controlled substance database reviewed with no concerns. Agree with above plan. Tandy Gaw PA-C

## 2018-05-11 NOTE — Progress Notes (Deleted)
HPI: FU CHF and atrial fibrillation. Patient had a Myoview on 03/17/2012 for risk stratification. There was no ischemia. However the patient was noted to be in atrial fibrillation which was a new diagnosis. Decision made for rate control and anticoagulation. Holter 8/15 showed a fib rate controlled. ABIs 9/15 normal; venous dopplers negative. Echocardiogram September 2017 was technically difficult. LV function felt to be normal. Mild left atrial enlargement and mild tricuspid regurgitation. Since last seen,   Current Outpatient Medications  Medication Sig Dispense Refill  . allopurinol (ZYLOPRIM) 300 MG tablet TAKE 1 TABLET BY MOUTH ONCE DAILY. *DUE FORE FOLLOW UP APPOINTMENT IN AUGUST* 90 tablet 0  . AMBULATORY NON FORMULARY MEDICATION Knee-high, medium compression, graduated compression stockings. Apply to lower extremities. Zippered Compression Stockings, large circ, long length/knee high (Patient not taking: Reported on 05/10/2018) 1 each 0  . AMBULATORY NON FORMULARY MEDICATION Need for one power wheelchair due to balance instability, lower extremity weakness, and lumbar DDD.  DX: R26.89, R29.898, M51.36 1 Device 0  . AMBULATORY NON FORMULARY MEDICATION Need for one power wheelchair due to balance instability, lower extremity weakness, and lumbar DDD.  DX: R26.89, R29.898, M51.36 1 Device 0  . atorvastatin (LIPITOR) 40 MG tablet TAKE 1 TABLET BY MOUTH ONCE DAILY AT 6 PM. 90 tablet 1  . Bismuth Tribromoph-Petrolatum (XEROFORM PETROLAT PATCH 4"X4") PADS Apply to irritated skin daily, replace daily as needed. 30 each 1  . clobetasol ointment (TEMOVATE) 0.05 % Apply 1 application topically 2 (two) times daily. (Patient taking differently: Apply 1 application topically as directed. ) 45 g 3  . clonazePAM (KLONOPIN) 1 MG tablet TAKE ONE-HALF TO 1 TABLET BY MOUTH TWICE DAILY AS NEEDED FOR ANXIETY 30 tablet 0  . cyclobenzaprine (FLEXERIL) 5 MG tablet Take 1 tablet (5 mg total) by mouth at  bedtime as needed for muscle spasms. 30 tablet 0  . diclofenac sodium (VOLTAREN) 1 % GEL Apply 4 g topically 4 (four) times daily. 1 Tube 5  . doxycycline (VIBRA-TABS) 100 MG tablet Take 1 tablet (100 mg total) by mouth 2 (two) times daily. 20 tablet 0  . DULoxetine (CYMBALTA) 60 MG capsule TAKE 2 CAPSULES BY MOUTH ONCE DAILY 60 capsule 3  . glimepiride (AMARYL) 4 MG tablet Take 1 tablet (4 mg total) by mouth 2 (two) times daily with a meal. 180 tablet 1  . glucose blood (ONE TOUCH ULTRA TEST) test strip Use to test blood sugar 2 times daily as instructed. Dx code: E11.9 300 each 3  . HYDROcodone-acetaminophen (NORCO/VICODIN) 5-325 MG tablet Take 1 tablet by mouth every 8 (eight) hours as needed for moderate pain. 30 tablet 0  . HYDROcodone-acetaminophen (NORCO/VICODIN) 5-325 MG tablet Take 1 tablet by mouth every 6 (six) hours as needed for moderate pain. 20 tablet 0  . lisinopril (PRINIVIL,ZESTRIL) 2.5 MG tablet TAKE 1 TABLET BY MOUTH ONCE DAILY 30 tablet 0  . metoprolol tartrate (LOPRESSOR) 100 MG tablet TAKE 1 TABLET BY MOUTH TWICE DAILY 180 tablet 1  . PRADAXA 150 MG CAPS capsule TAKE 1 CAPSULE BY MOUTH TWICE DAILY NEED  OFFICE  VISIT 180 capsule 0  . pregabalin (LYRICA) 200 MG capsule Take 1 capsule (200 mg total) by mouth 3 (three) times daily. 90 capsule 3  . SitaGLIPtin-MetFORMIN HCl [phone] MG TB24 Take 1 tablet by mouth daily. 30 tablet 2  . torsemide (DEMADEX) 10 MG tablet Take 1 tablet (10 mg total) by mouth 2 (two) times daily. 30 tablet 0  .  traMADol (ULTRAM) 50 MG tablet Take 1 tablet (50 mg total) by mouth every 8 (eight) hours. 90 tablet 1  . traZODone (DESYREL) 50 MG tablet Take 0.5-1 tablets (25-50 mg total) by mouth at bedtime as needed for sleep. 30 tablet 0  . TRULICITY 0.75 MG/0.5ML SOPN INJECT 1  SUBCUTANEOUSLY ONCE A WEEK 4 pen 1   No current facility-administered medications for this visit.      Past Medical History:  Diagnosis Date  . Anxiety and depression  09/11/2011  . Arthritis   . Atrial fibrillation (HCC)   . Chronic insomnia 09/11/2011  . CKD (chronic kidney disease) stage 3, GFR 30-59 ml/min (HCC) 08/07/2017  . Diabetes mellitus   . Fibromyalgia 09/11/2011  . Gait disorder 12/21/2011  . Gout 09/11/2011  . Hyperlipidemia   . Hypertension   . Normocytic anemia 08/07/2017  . OAB (overactive bladder) 06/21/2012  . Obesity 09/11/2011  . Peripheral edema 06/21/2012  . Sciatica of right side 09/11/2011  . Spinal stenosis 09/11/2011  . Thyroid disease   . Urine incontinence     Past Surgical History:  Procedure Laterality Date  . REPLACEMENT TOTAL KNEE BILATERAL     right approx 1994, left approx 1999  . SHOULDER SURGERY     right - approx 2005  . THYROIDECTOMY, PARTIAL     at 77yo    Social History   Socioeconomic History  . Marital status: Married    Spouse name: Not on file  . Number of children: 3  . Years of education: Not on file  . Highest education level: Not on file  Occupational History  . Not on file  Social Needs  . Financial resource strain: Not on file  . Food insecurity:    Worry: Not on file    Inability: Not on file  . Transportation needs:    Medical: Not on file    Non-medical: Not on file  Tobacco Use  . Smoking status: Never Smoker  . Smokeless tobacco: Never Used  Substance and Sexual Activity  . Alcohol use: No  . Drug use: No  . Sexual activity: Not on file  Lifestyle  . Physical activity:    Days per week: Not on file    Minutes per session: Not on file  . Stress: Not on file  Relationships  . Social connections:    Talks on phone: Not on file    Gets together: Not on file    Attends religious service: Not on file    Active member of club or organization: Not on file    Attends meetings of clubs or organizations: Not on file    Relationship status: Not on file  . Intimate partner violence:    Fear of current or ex partner: Not on file    Emotionally abused: Not on file     Physically abused: Not on file    Forced sexual activity: Not on file  Other Topics Concern  . Not on file  Social History Narrative  . Not on file    Family History  Problem Relation Age of Onset  . Arthritis Mother   . Depression Mother   . Arthritis Father   . Stroke Sister     ROS: no fevers or chills, productive cough, hemoptysis, dysphasia, odynophagia, melena, hematochezia, dysuria, hematuria, rash, seizure activity, orthopnea, PND, pedal edema, claudication. Remaining systems are negative.  Physical Exam: Well-developed well-nourished in no acute distress.  Skin is warm and dry.  HEENT is  normal.  Neck is supple.  Chest is clear to auscultation with normal expansion.  Cardiovascular exam is regular rate and rhythm.  Abdominal exam nontender or distended. No masses palpated. Extremities show no edema. neuro grossly intact  ECG- personally reviewed  A/P  1  Olga Millers, MD

## 2018-05-12 ENCOUNTER — Ambulatory Visit: Payer: Medicare Other | Admitting: Cardiology

## 2018-05-17 ENCOUNTER — Ambulatory Visit (INDEPENDENT_AMBULATORY_CARE_PROVIDER_SITE_OTHER): Payer: Medicare Other | Admitting: Physician Assistant

## 2018-05-17 ENCOUNTER — Other Ambulatory Visit: Payer: Self-pay | Admitting: *Deleted

## 2018-05-17 VITALS — BP 125/66 | HR 66

## 2018-05-17 DIAGNOSIS — I872 Venous insufficiency (chronic) (peripheral): Secondary | ICD-10-CM

## 2018-05-17 NOTE — Progress Notes (Signed)
Pt came into clinic today for unna boot application. Pt removed unna boot's at home last night prior to scheduled appointment today. Pt's legs are much better than last week. No longer seeping/bloody. Tissue still red on both shins, but in the healing stages. PCP able to view. Per PCP, unna boot's to be applied. Pt advised to return to clinic in 1 week for reapplication. She is due to follow up with PCP, she will make appt with PCP and unna boot next week. Verbalized understanding.   Agree with above plan. Tandy Gaw PA-C

## 2018-05-24 ENCOUNTER — Ambulatory Visit (INDEPENDENT_AMBULATORY_CARE_PROVIDER_SITE_OTHER): Payer: Medicare Other | Admitting: Physician Assistant

## 2018-05-24 ENCOUNTER — Encounter: Payer: Self-pay | Admitting: Physician Assistant

## 2018-05-24 VITALS — BP 112/56 | HR 62 | Ht 67.01 in

## 2018-05-24 DIAGNOSIS — I482 Chronic atrial fibrillation: Secondary | ICD-10-CM

## 2018-05-24 DIAGNOSIS — L97101 Non-pressure chronic ulcer of unspecified thigh limited to breakdown of skin: Secondary | ICD-10-CM

## 2018-05-24 DIAGNOSIS — R29898 Other symptoms and signs involving the musculoskeletal system: Secondary | ICD-10-CM | POA: Diagnosis not present

## 2018-05-24 DIAGNOSIS — N183 Chronic kidney disease, stage 3 unspecified: Secondary | ICD-10-CM

## 2018-05-24 DIAGNOSIS — E1122 Type 2 diabetes mellitus with diabetic chronic kidney disease: Secondary | ICD-10-CM

## 2018-05-24 DIAGNOSIS — M549 Dorsalgia, unspecified: Secondary | ICD-10-CM | POA: Diagnosis not present

## 2018-05-24 DIAGNOSIS — F330 Major depressive disorder, recurrent, mild: Secondary | ICD-10-CM

## 2018-05-24 DIAGNOSIS — I83001 Varicose veins of unspecified lower extremity with ulcer of thigh: Secondary | ICD-10-CM

## 2018-05-24 DIAGNOSIS — I4821 Permanent atrial fibrillation: Secondary | ICD-10-CM

## 2018-05-24 LAB — POCT GLYCOSYLATED HEMOGLOBIN (HGB A1C): HEMOGLOBIN A1C: 6 % — AB (ref 4.0–5.6)

## 2018-05-24 MED ORDER — DULAGLUTIDE 0.75 MG/0.5ML ~~LOC~~ SOAJ: SUBCUTANEOUS | 0 refills | Status: DC

## 2018-05-24 MED ORDER — METHOCARBAMOL 500 MG PO TABS: 500.0000 mg | ORAL_TABLET | Freq: Three times a day (TID) | ORAL | 2 refills | Status: DC

## 2018-05-24 NOTE — Patient Instructions (Signed)
Upper back spasm/pain try robaxin.

## 2018-05-24 NOTE — Progress Notes (Deleted)
Januvia/metformin/amaryl trulicity.   Increased cymbalta to 120mg .   Physical therapy??

## 2018-05-24 NOTE — Progress Notes (Signed)
Subjective:    Patient ID: Bonnie Clark, female    DOB: [birth year], 77 y.o.   MRN: [id number]  HPI  Patient is a 77 yo female with a pmhx of chronic venous stasis dermatitis of bilateral LEs, CKD, T2DM, and CHF who presents to the clinic today for diabetes and venous stasis follow up.  Chronic venous stasis - She states her legs appear to have improved with reduced swelling and number of sores. The redness has persisted. She completed her course of antibiotic and feels like it really helped. She gets her legs wrapped periodically and states she notices severe worsening by 1-2 weeks if left unwrapped.   T2DM - She is taking trulicity, glimepiride, and sitagliptin-metformin for her diabetes management. She reports to episodes of hypoglycemia - one week ago and 2 weeks ago. Episodes occurred after not eating and she reports symptoms of weakness and dizziness. She drank orange juice and felt better. She reports eating less with Trulicity but is concerned about the cost of this medication.  HTN - She is not currently taking lisinopril as her BP has been well controlled.  Pain - She has chronic left shoulder pain secondary to OA. Over the past couple of weeks pain has progressed to radiating from her left neck down into her are and mid back. She denies any new mechanism of injury. She is using Lyrica but does not think it is helping. Heat has improved shoulder pain in the past.  Mood - Recently increased cymbalta and doing well. She does admit to increased stress in the home given her husband's recent medical problems and his reduced ability to take care of her.   Review of Systems  All other systems reviewed and are negative.   .. Active Ambulatory Problems    Diagnosis Date Noted  . Depression 09/11/2011  . Arthritis   . Type 2 diabetes mellitus (HCC)   . Hyperlipidemia   . Hypertension   . Thyroid disease   . Morbidly obese (HCC) 09/11/2011  . Chronic insomnia 09/11/2011  . Chronic pain  09/11/2011  . Fibromyalgia 09/11/2011  . Anxiety and depression 09/11/2011  . Fatigue 09/11/2011  . Gout 09/11/2011  . Gait disorder 12/21/2011  . Atrial fibrillation 03/19/2012  . Cardiomyopathy (HCC) 04/21/2012  . Peripheral edema 06/21/2012  . OAB (overactive bladder) 06/21/2012  . Hypersomnolence 06/21/2012  . Other malaise and fatigue 02/14/2013  . Pre-ulcerative corn or callous 02/14/2013  . Osteoarthritis of both hips 10/11/2013  . Lumbar degenerative disc disease 10/11/2013  . Renal insufficiency 03/15/2014  . Venous stasis dermatitis of both lower extremities 04/03/2014  . Physical deconditioning 05/31/2014  . Ganglion cyst 03/12/2015  . Venous stasis ulcer of thigh limited to breakdown of skin with varicose veins (HCC) 12/16/2016  . Balance problems 03/06/2017  . Weakness of lower extremity 03/06/2017  . Cellulitis of lower extremity 05/13/2017  . Lower leg edema 05/13/2017  . Primary osteoarthritis of left shoulder 06/08/2017  . Mass of forearm, left 06/08/2017  . Acute on chronic congestive heart failure (HCC) 08/06/2017  . CKD (chronic kidney disease) stage 3, GFR 30-59 ml/min (HCC) 08/07/2017  . Normocytic anemia 08/07/2017  . SOB (shortness of breath) 08/07/2017  . Low hemoglobin 08/11/2017  . Chronic pulmonary edema 08/13/2017  . Weakness of both lower extremities 10/17/2017  . Hypoglycemia 03/04/2018  . Shaky 03/04/2018   Resolved Ambulatory Problems    Diagnosis Date Noted  . Spinal stenosis 09/11/2011  . Sciatica of right side 09/11/2011  .  Chest pain 03/05/2012  . Urinary incontinence 03/05/2012  . Abscess of heel, left 02/14/2013  . Osteoarthritis of right hip 01/30/2014  . Pressure ulcer stage II 03/12/2015  . Olecranon bursitis of left elbow 06/29/2015  . Decubitus ulcer of left buttock, stage 2 08/13/2017   Past Medical History:  Diagnosis Date  . Anxiety and depression 09/11/2011  . Arthritis   . Atrial fibrillation (HCC)   . Chronic  insomnia 09/11/2011  . CKD (chronic kidney disease) stage 3, GFR 30-59 ml/min (HCC) 08/07/2017  . Diabetes mellitus   . Fibromyalgia 09/11/2011  . Gait disorder 12/21/2011  . Gout 09/11/2011  . Hyperlipidemia   . Hypertension   . Normocytic anemia 08/07/2017  . OAB (overactive bladder) 06/21/2012  . Obesity 09/11/2011  . Peripheral edema 06/21/2012  . Sciatica of right side 09/11/2011  . Spinal stenosis 09/11/2011  . Thyroid disease   . Urine incontinence    .[name] Kitchen Family History  Problem Relation Age of Onset  . Arthritis Mother   . Depression Mother   . Arthritis Father   . Stroke Sister         Objective:   Physical Exam  Constitutional: She is oriented to person, place, and time. She appears well-developed and well-nourished.  HENT:  Head: Normocephalic and atraumatic.  Neck: Normal range of motion. Neck supple.  Cardiovascular: Normal rate and regular rhythm.  Pulmonary/Chest: Effort normal and breath sounds normal.  Musculoskeletal:  Tightness of upper back and neck muscles.   Neurological: She is alert and oriented to person, place, and time.  Skin: She is not diaphoretic.  Bilateral non pitting edema of both legs. Erythema anterior bilaterally. No blisters or sores. No draining.   Psychiatric: She has a normal mood and affect. Her behavior is normal.  Vitals reviewed.    . Vitals:   05/24/18 1357  BP: (!) 112/56  Pulse: 62  SpO2: 96%      Assessment & Plan:  [name] KitchenMarland KitchenBarabara was seen today for follow-up.  Diagnoses and all orders for this visit:  Type 2 diabetes mellitus with stage 3 chronic kidney disease, without long-term current use of insulin (HCC) -     POCT HgB A1C -     Dulaglutide (TRULICITY) 0.75 MG/0.5ML SOPN; INJECT 1  SUBCUTANEOUSLY ONCE A WEEK  Stage 3 chronic kidney disease (HCC) -     BASIC METABOLIC PANEL WITH GFR  Permanent atrial fibrillation (HCC)  Venous stasis ulcer of thigh limited to breakdown of skin with varicose veins, unspecified  laterality (HCC) -     Remv/revisn boot/body cast  Mild episode of recurrent major depressive disorder (HCC)  Morbidly obese (HCC)  Weakness of lower extremity, unspecified laterality  Upper back pain -     methocarbamol (ROBAXIN) 500 MG tablet; Take 1 tablet (500 mg total) by mouth 3 (three) times daily.   .. Results for orders placed or performed in visit on 05/24/18  POCT HgB A1C  Result Value Ref Range   Hemoglobin A1C 6.0 (A) 4.0 - 5.6 %   HbA1c POC (<> result, manual entry)  4.0 - 5.6 %   HbA1c, POC (prediabetic range)  5.7 - 6.4 %   HbA1c, POC (controlled diabetic range)  0.0 - 7.0 %     T2DM - HgbA1c of 6.0 shows good control on current regimen. Counseled patient to maintain a steady, healthy diet to prevent hypoglycemia. Discussed the cost of her medications and am hopeful price will go down. She will call the pharmaceutical  company to see if she is eligible for reduced cost. Also wrote a note to the pharmacy to check if there are any less expensive comparable GLP-1 medications. As she is doing well on this current regimen would like to continue with it.  Chronic venous stasis - Discussed with patient that redness will likely persist due to chronic changes. Discussed need for Unna boots regularly as she experiences acute worsening anytime they are removed. She will have Unna boots applied today.  Shoulder pain - She is likely experiencing increased pain secondary to muscle tightening and compensation for chronic osteoarthritis in her right shoulder. Encouraged PT and heat. She is unable to take NSAIDs due to CKD. Robaxin prescribed.  CKD - Labs to check function today.  Discussed with patient and her husband options for increased support at home. Encouraged discussion among her, her husband, and their children about potential options. Talked about home health and assisted living options.

## 2018-05-31 ENCOUNTER — Other Ambulatory Visit: Payer: Self-pay | Admitting: Cardiology

## 2018-05-31 NOTE — Telephone Encounter (Signed)
Rx sent to pharmacy   

## 2018-06-01 ENCOUNTER — Encounter: Payer: Self-pay | Admitting: Physician Assistant

## 2018-06-01 ENCOUNTER — Ambulatory Visit (INDEPENDENT_AMBULATORY_CARE_PROVIDER_SITE_OTHER): Payer: Medicare Other | Admitting: Physician Assistant

## 2018-06-01 ENCOUNTER — Ambulatory Visit: Payer: Medicare Other | Admitting: Physician Assistant

## 2018-06-01 VITALS — BP 145/70 | HR 84 | Ht 67.01 in

## 2018-06-01 DIAGNOSIS — I83001 Varicose veins of unspecified lower extremity with ulcer of thigh: Secondary | ICD-10-CM | POA: Diagnosis not present

## 2018-06-01 DIAGNOSIS — E1122 Type 2 diabetes mellitus with diabetic chronic kidney disease: Secondary | ICD-10-CM

## 2018-06-01 DIAGNOSIS — Z79899 Other long term (current) drug therapy: Secondary | ICD-10-CM | POA: Diagnosis not present

## 2018-06-01 DIAGNOSIS — N183 Chronic kidney disease, stage 3 (moderate): Secondary | ICD-10-CM

## 2018-06-01 DIAGNOSIS — M549 Dorsalgia, unspecified: Secondary | ICD-10-CM

## 2018-06-01 DIAGNOSIS — M62838 Other muscle spasm: Secondary | ICD-10-CM | POA: Diagnosis not present

## 2018-06-01 DIAGNOSIS — L97101 Non-pressure chronic ulcer of unspecified thigh limited to breakdown of skin: Secondary | ICD-10-CM

## 2018-06-01 MED ORDER — TIZANIDINE HCL 4 MG PO TABS: ORAL_TABLET | ORAL | 1 refills | Status: DC

## 2018-06-01 MED ORDER — DULAGLUTIDE 0.75 MG/0.5ML ~~LOC~~ SOAJ: SUBCUTANEOUS | 1 refills | Status: DC

## 2018-06-01 NOTE — Progress Notes (Signed)
Subjective:    Patient ID: Bonnie Clark, female    DOB: 1940/12/22, 77 y.o.   MRN: [id number]  HPI  Pt is a 77 yo morbidly obese female who presents to the clinic for 1 week follow up on bilateral leg chronic swelling and unna boot wrap. Her upper back is tight and still hurting her. She was not able to afford  robaxin. Needs another muscle relaxer. She wonders what the next step is.   She was not able to get trulicity refilled and needs recent.   .. Active Ambulatory Problems    Diagnosis Date Noted  . Depression 09/11/2011  . Arthritis   . Type 2 diabetes mellitus (HCC)   . Hyperlipidemia   . Hypertension   . Thyroid disease   . Morbidly obese (HCC) 09/11/2011  . Chronic insomnia 09/11/2011  . Chronic pain 09/11/2011  . Fibromyalgia 09/11/2011  . Anxiety and depression 09/11/2011  . Fatigue 09/11/2011  . Gout 09/11/2011  . Gait disorder 12/21/2011  . Atrial fibrillation 03/19/2012  . Cardiomyopathy (HCC) 04/21/2012  . Peripheral edema 06/21/2012  . OAB (overactive bladder) 06/21/2012  . Hypersomnolence 06/21/2012  . Other malaise and fatigue 02/14/2013  . Pre-ulcerative corn or callous 02/14/2013  . Osteoarthritis of both hips 10/11/2013  . Lumbar degenerative disc disease 10/11/2013  . Renal insufficiency 03/15/2014  . Venous stasis dermatitis of both lower extremities 04/03/2014  . Physical deconditioning 05/31/2014  . Ganglion cyst 03/12/2015  . Venous stasis ulcer of thigh limited to breakdown of skin with varicose veins (HCC) 12/16/2016  . Balance problems 03/06/2017  . Weakness of lower extremity 03/06/2017  . Cellulitis of lower extremity 05/13/2017  . Lower leg edema 05/13/2017  . Primary osteoarthritis of left shoulder 06/08/2017  . Mass of forearm, left 06/08/2017  . Acute on chronic congestive heart failure (HCC) 08/06/2017  . CKD (chronic kidney disease) stage 3, GFR 30-59 ml/min (HCC) 08/07/2017  . Normocytic anemia 08/07/2017  . SOB (shortness of  breath) 08/07/2017  . Low hemoglobin 08/11/2017  . Chronic pulmonary edema 08/13/2017  . Weakness of both lower extremities 10/17/2017  . Hypoglycemia 03/04/2018  . Shaky 03/04/2018   Resolved Ambulatory Problems    Diagnosis Date Noted  . Spinal stenosis 09/11/2011  . Sciatica of right side 09/11/2011  . Chest pain 03/05/2012  . Urinary incontinence 03/05/2012  . Abscess of heel, left 02/14/2013  . Osteoarthritis of right hip 01/30/2014  . Pressure ulcer stage II 03/12/2015  . Olecranon bursitis of left elbow 06/29/2015  . Decubitus ulcer of left buttock, stage 2 08/13/2017   Past Medical History:  Diagnosis Date  . Anxiety and depression 09/11/2011  . Arthritis   . Atrial fibrillation (HCC)   . Chronic insomnia 09/11/2011  . CKD (chronic kidney disease) stage 3, GFR 30-59 ml/min (HCC) 08/07/2017  . Diabetes mellitus   . Fibromyalgia 09/11/2011  . Gait disorder 12/21/2011  . Gout 09/11/2011  . Hyperlipidemia   . Hypertension   . Normocytic anemia 08/07/2017  . OAB (overactive bladder) 06/21/2012  . Obesity 09/11/2011  . Peripheral edema 06/21/2012  . Sciatica of right side 09/11/2011  . Spinal stenosis 09/11/2011  . Thyroid disease   . Urine incontinence    .[name] Kitchen Family History  Problem Relation Age of Onset  . Arthritis Mother   . Depression Mother   . Arthritis Father   . Stroke Sister       Review of Systems See HPI.     Objective:  Physical Exam  Constitutional: She appears well-developed.  Non ambulatory/morbidly obese.   HENT:  Head: Normocephalic and atraumatic.  Eyes: Conjunctivae and EOM are normal.  Neck: Normal range of motion. Neck supple.  Cardiovascular: Normal rate and regular rhythm.  Pulmonary/Chest: Effort normal and breath sounds normal.  Musculoskeletal:  Tightness over upper back. No tenderness over cervical or thoracic spine to palpation.   Psychiatric: She has a normal mood and affect. Her behavior is normal.           Assessment & Plan:  [name] KitchenMarland KitchenMarci was seen today for follow-up.  Diagnoses and all orders for this visit:  Upper back pain -     tiZANidine (ZANAFLEX) 4 MG tablet; Take one tablet as needed for every 6-8 hours for muscle spasms and pain. -     Ambulatory referral to Physical Therapy  Type 2 diabetes mellitus with stage 3 chronic kidney disease, without long-term current use of insulin (HCC) -     Dulaglutide (TRULICITY) 0.75 MG/0.5ML SOPN; INJECT 1  SUBCUTANEOUSLY ONCE A WEEK -     COMPLETE METABOLIC PANEL WITH GFR  Muscle spasm -     tiZANidine (ZANAFLEX) 4 MG tablet; Take one tablet as needed for every 6-8 hours for muscle spasms and pain. -     Ambulatory referral to Physical Therapy  Venous stasis ulcer of thigh limited to breakdown of skin with varicose veins, unspecified laterality (HCC)  Medication management -     COMPLETE METABOLIC PANEL WITH GFR   Ok for PT. Switched robaxin to tizandine. Dicussed symptomatic care with biofreeze/valtaren gell.   Rewrapped legs for edema/swelling/ulcers/leg drainage. Follow up in 1 week.   For DM. Rewrote trulicity.

## 2018-06-06 ENCOUNTER — Encounter: Payer: Self-pay | Admitting: Physician Assistant

## 2018-06-07 ENCOUNTER — Ambulatory Visit (INDEPENDENT_AMBULATORY_CARE_PROVIDER_SITE_OTHER): Payer: Medicare Other | Admitting: Physician Assistant

## 2018-06-07 VITALS — BP 140/78 | HR 62

## 2018-06-07 DIAGNOSIS — L97101 Non-pressure chronic ulcer of unspecified thigh limited to breakdown of skin: Secondary | ICD-10-CM | POA: Diagnosis not present

## 2018-06-07 DIAGNOSIS — I83001 Varicose veins of unspecified lower extremity with ulcer of thigh: Secondary | ICD-10-CM | POA: Diagnosis not present

## 2018-06-07 NOTE — Progress Notes (Signed)
Pt came into clinic today for unna boot application. Pt removed last unna bootslast night.No visible drainage, some redness on right lower extremity. Physician able to view them, advised to apply bilateral unna boots. Pt tolerated application well. Advised to return in one week for change. No further questions.  Agree with above plan. Tandy Gaw PA-C

## 2018-06-14 ENCOUNTER — Ambulatory Visit: Payer: Medicare Other

## 2018-06-16 ENCOUNTER — Other Ambulatory Visit: Payer: Self-pay | Admitting: Physician Assistant

## 2018-06-17 ENCOUNTER — Ambulatory Visit (INDEPENDENT_AMBULATORY_CARE_PROVIDER_SITE_OTHER): Payer: Medicare Other | Admitting: Family Medicine

## 2018-06-17 VITALS — BP 134/76 | HR 65

## 2018-06-17 DIAGNOSIS — I83001 Varicose veins of unspecified lower extremity with ulcer of thigh: Secondary | ICD-10-CM

## 2018-06-17 DIAGNOSIS — L97101 Non-pressure chronic ulcer of unspecified thigh limited to breakdown of skin: Secondary | ICD-10-CM

## 2018-06-17 NOTE — Progress Notes (Signed)
Pt came into clinic today for bilat unna boot application. Pt removed last unna bootson Sunday (4 days ago).No visible drainage, some redness on bilat lower extremity. Physician able to view them, advised to apply bilateral unna boots. Pt tolerated application well. Advised to return in one week for change. No further questions.

## 2018-06-17 NOTE — Progress Notes (Signed)
Agree with above.  Her legs overall look great.  She has some chronic erythema and edema but there are no open sores or ulcerations or blisters today which is fantastic considering she has not had the compression on for about 5 days.  Agree with documentation as above.   Nani Gasser, MD

## 2018-06-24 ENCOUNTER — Other Ambulatory Visit: Payer: Self-pay | Admitting: Cardiology

## 2018-06-24 ENCOUNTER — Ambulatory Visit (INDEPENDENT_AMBULATORY_CARE_PROVIDER_SITE_OTHER): Payer: Medicare Other | Admitting: Physician Assistant

## 2018-06-24 VITALS — BP 116/77 | HR 68

## 2018-06-24 DIAGNOSIS — I872 Venous insufficiency (chronic) (peripheral): Secondary | ICD-10-CM

## 2018-06-24 NOTE — Progress Notes (Signed)
Pt in for bilateral unna boots. Denies open sores/wounds. Denies fever/chills/malaise.   Vitals:   06/24/18 1337  BP: 116/77  Pulse: 68   Return in one week for nurse unna boot change

## 2018-07-01 ENCOUNTER — Ambulatory Visit (INDEPENDENT_AMBULATORY_CARE_PROVIDER_SITE_OTHER): Payer: Medicare Other | Admitting: Sports Medicine

## 2018-07-01 DIAGNOSIS — I872 Venous insufficiency (chronic) (peripheral): Secondary | ICD-10-CM | POA: Diagnosis not present

## 2018-07-01 NOTE — Progress Notes (Signed)
Pt in today for bilateral unna boots.  Legs were red in color but no open sores and no oozing.

## 2018-07-01 NOTE — Assessment & Plan Note (Signed)
We have been doing Unna boots for many months now, all of her ulcers are now healed, she has the classic hemosiderin deposition of venous stasis. I think we can discontinue Unna boot, I am going to get her set up with the lymphedema clinic to discuss custom lower extremity compression hose.

## 2018-07-08 ENCOUNTER — Ambulatory Visit: Payer: Medicare Other | Admitting: Sports Medicine

## 2018-07-08 ENCOUNTER — Other Ambulatory Visit: Payer: Self-pay | Admitting: Cardiology

## 2018-07-13 ENCOUNTER — Other Ambulatory Visit: Payer: Self-pay | Admitting: Sports Medicine

## 2018-07-13 DIAGNOSIS — I872 Venous insufficiency (chronic) (peripheral): Secondary | ICD-10-CM

## 2018-07-15 ENCOUNTER — Ambulatory Visit (INDEPENDENT_AMBULATORY_CARE_PROVIDER_SITE_OTHER): Payer: Medicare Other | Admitting: Sports Medicine

## 2018-07-15 ENCOUNTER — Ambulatory Visit (INDEPENDENT_AMBULATORY_CARE_PROVIDER_SITE_OTHER): Payer: Medicare Other

## 2018-07-15 DIAGNOSIS — M19012 Primary osteoarthritis, left shoulder: Secondary | ICD-10-CM

## 2018-07-15 DIAGNOSIS — M5412 Radiculopathy, cervical region: Secondary | ICD-10-CM | POA: Insufficient documentation

## 2018-07-15 DIAGNOSIS — M5031 Other cervical disc degeneration,  high cervical region: Secondary | ICD-10-CM | POA: Diagnosis not present

## 2018-07-15 DIAGNOSIS — M542 Cervicalgia: Secondary | ICD-10-CM | POA: Diagnosis not present

## 2018-07-15 MED ORDER — PREDNISONE 50 MG PO TABS: ORAL_TABLET | ORAL | 0 refills | Status: DC

## 2018-07-15 NOTE — Progress Notes (Signed)
Subjective:    CC: Left shoulder pain  HPI: Bonnie Clark is a pleasant 77 year old female, she has known left glenohumeral osteoarthritis, now having a recurrence of pain, severe, persistent, localized without radiation, worse with any abduction movements.  In addition she has pain around her neck, periscapular region with radiation down the left hand to the thumb.  Moderate, persistent, no bowel or bladder dysfunction, saddle numbness, constitutional symptoms, no weakness progressive.  I reviewed the past medical history, family history, social history, surgical history, and allergies today and no changes were needed.  Please see the problem list section below in epic for further details.  Past Medical History: Past Medical History:  Diagnosis Date  . Anxiety and depression 09/11/2011  . Arthritis   . Atrial fibrillation (HCC)   . Chronic insomnia 09/11/2011  . CKD (chronic kidney disease) stage 3, GFR 30-59 ml/min (HCC) 08/07/2017  . Diabetes mellitus   . Fibromyalgia 09/11/2011  . Gait disorder 12/21/2011  . Gout 09/11/2011  . Hyperlipidemia   . Hypertension   . Normocytic anemia 08/07/2017  . OAB (overactive bladder) 06/21/2012  . Obesity 09/11/2011  . Peripheral edema 06/21/2012  . Sciatica of right side 09/11/2011  . Spinal stenosis 09/11/2011  . Thyroid disease   . Urine incontinence    Past Surgical History: Past Surgical History:  Procedure Laterality Date  . REPLACEMENT TOTAL KNEE BILATERAL     right approx 1994, left approx 1999  . SHOULDER SURGERY     right - approx 2005  . THYROIDECTOMY, PARTIAL     at 77yo   Social History: Social History   Socioeconomic History  . Marital status: Married    Spouse name: Not on file  . Number of children: 3  . Years of education: Not on file  . Highest education level: Not on file  Occupational History  . Not on file  Social Needs  . Financial resource strain: Not on file  . Food insecurity:    Worry: Not on file   Inability: Not on file  . Transportation needs:    Medical: Not on file    Non-medical: Not on file  Tobacco Use  . Smoking status: Never Smoker  . Smokeless tobacco: Never Used  Substance and Sexual Activity  . Alcohol use: No  . Drug use: No  . Sexual activity: Not on file  Lifestyle  . Physical activity:    Days per week: Not on file    Minutes per session: Not on file  . Stress: Not on file  Relationships  . Social connections:    Talks on phone: Not on file    Gets together: Not on file    Attends religious service: Not on file    Active member of club or organization: Not on file    Attends meetings of clubs or organizations: Not on file    Relationship status: Not on file  Other Topics Concern  . Not on file  Social History Narrative  . Not on file   Family History: Family History  Problem Relation Age of Onset  . Arthritis Mother   . Depression Mother   . Arthritis Father   . Stroke Sister    Allergies: Allergies  Allergen Reactions  . Glipizide     diarrhea  . Myrbetriq [Mirabegron]     Could not get a deep breath.   . Voltaren [Diclofenac Sodium]     Rash   Medications: See med rec.  Review of Systems: No  fevers, chills, night sweats, weight loss, chest pain, or shortness of breath.   Objective:    General: Well Developed, well nourished, and in no acute distress.  Neuro: Alert and oriented x3, extra-ocular muscles intact, sensation grossly intact.  HEENT: Normocephalic, atraumatic, pupils equal round reactive to light, neck supple, no masses, no lymphadenopathy, thyroid nonpalpable.  Skin: Warm and dry, no rashes. Cardiac: Regular rate and rhythm, no murmurs rubs or gallops, no lower extremity edema.  Respiratory: Clear to auscultation bilaterally. Not using accessory muscles, speaking in full sentences. Left shoulder: Palpable crepitus, external rotation to neutral.  Procedure: Real-time Ultrasound Guided Injection of left glenohumeral  joint Device: GE Logiq E  Verbal informed consent obtained.  Time-out conducted.  Noted no overlying erythema, induration, or other signs of local infection.  Skin prepped in a sterile fashion.  Local anesthesia: Topical Ethyl chloride.  With sterile technique and under real time ultrasound guidance: Using a posterior approach I guided a 22-gauge spinal needle into the [facility] humeral joint and injected 1 cc Kenalog 40, 2 cc lidocaine, 2 cc bupivacaine. Completed without difficulty  Pain immediately resolved suggesting accurate placement of the medication.  Advised to call if fevers/chills, erythema, induration, drainage, or persistent bleeding.  Images permanently stored and available for review in the ultrasound unit.  Impression: Technically successful ultrasound guided injection.  Impression and Recommendations:    Primary osteoarthritis of left shoulder Repeat left glenohumeral joint injection.  She also has radicular symptoms down to the fingers. C6 distribution. Adding x-rays of the neck, home health physical therapy for the neck.   Left cervical radiculopathy She also has radicular symptoms down to the fingers. C6 distribution. Adding x-rays of the neck, home health physical therapy for the neck. ___________________________________________ Ihor Austin. Benjamin Stain, M.D., ABFM., CAQSM. Primary Care and Sports Medicine [facility]  Adjunct Instructor of Family Medicine  [employer]

## 2018-07-15 NOTE — Assessment & Plan Note (Signed)
Repeat left glenohumeral joint injection.  She also has radicular symptoms down to the fingers. C6 distribution. Adding x-rays of the neck, home health physical therapy for the neck.

## 2018-07-15 NOTE — Assessment & Plan Note (Signed)
She also has radicular symptoms down to the fingers. C6 distribution. Adding x-rays of the neck, home health physical therapy for the neck.

## 2018-07-20 ENCOUNTER — Ambulatory Visit: Payer: Medicare Other | Admitting: Physician Assistant

## 2018-07-20 DIAGNOSIS — E1122 Type 2 diabetes mellitus with diabetic chronic kidney disease: Secondary | ICD-10-CM | POA: Diagnosis not present

## 2018-07-20 DIAGNOSIS — M5412 Radiculopathy, cervical region: Secondary | ICD-10-CM | POA: Diagnosis not present

## 2018-07-28 ENCOUNTER — Encounter: Payer: Self-pay | Admitting: Sports Medicine

## 2018-07-28 ENCOUNTER — Ambulatory Visit: Payer: Medicare Other | Admitting: Physician Assistant

## 2018-08-03 ENCOUNTER — Telehealth: Payer: Self-pay | Admitting: *Deleted

## 2018-08-03 NOTE — Telephone Encounter (Signed)
Two separate vms from two different [facility] depts stating that they are discharging her from ALL of their services.

## 2018-08-04 ENCOUNTER — Ambulatory Visit (INDEPENDENT_AMBULATORY_CARE_PROVIDER_SITE_OTHER): Payer: Medicare Other | Admitting: Physician Assistant

## 2018-08-04 ENCOUNTER — Encounter: Payer: Self-pay | Admitting: Physician Assistant

## 2018-08-04 VITALS — BP 91/44 | HR 67 | Ht 67.02 in

## 2018-08-04 DIAGNOSIS — E782 Mixed hyperlipidemia: Secondary | ICD-10-CM

## 2018-08-04 DIAGNOSIS — Z79899 Other long term (current) drug therapy: Secondary | ICD-10-CM

## 2018-08-04 DIAGNOSIS — L03115 Cellulitis of right lower limb: Secondary | ICD-10-CM | POA: Diagnosis not present

## 2018-08-04 DIAGNOSIS — J209 Acute bronchitis, unspecified: Secondary | ICD-10-CM | POA: Diagnosis not present

## 2018-08-04 DIAGNOSIS — M1A00X0 Idiopathic chronic gout, unspecified site, without tophus (tophi): Secondary | ICD-10-CM | POA: Diagnosis not present

## 2018-08-04 DIAGNOSIS — L03116 Cellulitis of left lower limb: Secondary | ICD-10-CM

## 2018-08-04 DIAGNOSIS — I501 Left ventricular failure: Secondary | ICD-10-CM

## 2018-08-04 DIAGNOSIS — F330 Major depressive disorder, recurrent, mild: Secondary | ICD-10-CM | POA: Diagnosis not present

## 2018-08-04 MED ORDER — ATORVASTATIN CALCIUM 40 MG PO TABS: ORAL_TABLET | ORAL | 1 refills | Status: DC

## 2018-08-04 MED ORDER — METOPROLOL TARTRATE 100 MG PO TABS: 100.0000 mg | ORAL_TABLET | Freq: Two times a day (BID) | ORAL | 1 refills | Status: DC

## 2018-08-04 MED ORDER — LISINOPRIL 2.5 MG PO TABS: ORAL_TABLET | ORAL | 4 refills | Status: DC

## 2018-08-04 MED ORDER — DULOXETINE HCL 60 MG PO CPEP: 120.0000 mg | ORAL_CAPSULE | Freq: Every day | ORAL | 3 refills | Status: AC

## 2018-08-04 MED ORDER — ALLOPURINOL 300 MG PO TABS: ORAL_TABLET | ORAL | 0 refills | Status: DC

## 2018-08-04 MED ORDER — TORSEMIDE 10 MG PO TABS: 10.0000 mg | ORAL_TABLET | Freq: Two times a day (BID) | ORAL | 0 refills | Status: DC

## 2018-08-04 MED ORDER — DOXYCYCLINE HYCLATE 100 MG PO TABS: 100.0000 mg | ORAL_TABLET | Freq: Two times a day (BID) | ORAL | 0 refills | Status: DC

## 2018-08-04 NOTE — Progress Notes (Signed)
l °

## 2018-08-04 NOTE — Patient Instructions (Signed)
Start doxycycline.  Follow up in 10 days for recheck.

## 2018-08-09 DIAGNOSIS — I501 Left ventricular failure: Secondary | ICD-10-CM | POA: Insufficient documentation

## 2018-08-09 NOTE — Progress Notes (Signed)
Subjective:    Patient ID: Bonnie Clark, female    DOB: [birth year], 77 y.o.   MRN: [id number]  HPI Pt is a 77 yo morbidly obese female with chronic venous stasis, HTN, HLD, T2DM, A. Fib who presents to the clinic to discuss worsening swelling and sores on legs she also has a cough, wheezing, SOB.   She has not had her legs wrapped in unna boot for a few weeks. When this happens she almost always gets worsening swelling that eventually leads to infection. She comes in with a large blister on left leg with erythema and tenderness. Some small open blisters that are oozing her right leg as well.   She has had a cough for 3 weeks. It is productive. She is more SOB and chest feels "tight". She is having trouble sleeping at night. No fever that she has checked. She denies any sinus pressure, ear popping, ST. She did just have a steroid injection in shoulder not too long ago. She is taking mucinex with little relief. She takes tylenol occasionally.   .. Active Ambulatory Problems    Diagnosis Date Noted  . Depression 09/11/2011  . Arthritis   . Type 2 diabetes mellitus (HCC)   . Hyperlipidemia   . Hypertension   . Thyroid disease   . Morbidly obese (HCC) 09/11/2011  . Chronic insomnia 09/11/2011  . Chronic pain 09/11/2011  . Fibromyalgia 09/11/2011  . Anxiety and depression 09/11/2011  . Fatigue 09/11/2011  . Gout 09/11/2011  . Gait disorder 12/21/2011  . Atrial fibrillation 03/19/2012  . Cardiomyopathy (HCC) 04/21/2012  . Peripheral edema 06/21/2012  . OAB (overactive bladder) 06/21/2012  . Hypersomnolence 06/21/2012  . Other malaise and fatigue 02/14/2013  . Pre-ulcerative corn or callous 02/14/2013  . Osteoarthritis of both hips 10/11/2013  . Lumbar degenerative disc disease 10/11/2013  . Renal insufficiency 03/15/2014  . Venous stasis dermatitis of both lower extremities 04/03/2014  . Physical deconditioning 05/31/2014  . Ganglion cyst 03/12/2015  . Venous stasis ulcer of thigh  limited to breakdown of skin with varicose veins (HCC) 12/16/2016  . Balance problems 03/06/2017  . Weakness of lower extremity 03/06/2017  . Cellulitis of lower extremity 05/13/2017  . Lower leg edema 05/13/2017  . Primary osteoarthritis of left shoulder 06/08/2017  . Mass of forearm, left 06/08/2017  . Acute on chronic congestive heart failure (HCC) 08/06/2017  . CKD (chronic kidney disease) stage 3, GFR 30-59 ml/min (HCC) 08/07/2017  . Normocytic anemia 08/07/2017  . SOB (shortness of breath) 08/07/2017  . Low hemoglobin 08/11/2017  . Chronic pulmonary edema 08/13/2017  . Weakness of both lower extremities 10/17/2017  . Hypoglycemia 03/04/2018  . Shaky 03/04/2018  . Left cervical radiculopathy 07/15/2018   Resolved Ambulatory Problems    Diagnosis Date Noted  . Spinal stenosis 09/11/2011  . Sciatica of right side 09/11/2011  . Chest pain 03/05/2012  . Urinary incontinence 03/05/2012  . Abscess of heel, left 02/14/2013  . Osteoarthritis of right hip 01/30/2014  . Pressure ulcer stage II 03/12/2015  . Olecranon bursitis of left elbow 06/29/2015  . Decubitus ulcer of left buttock, stage 2 08/13/2017   Past Medical History:  Diagnosis Date  . Diabetes mellitus   . Gout 09/11/2011  . Obesity 09/11/2011  . Urine incontinence    .[name] Kitchen Family History  Problem Relation Age of Onset  . Arthritis Mother   . Depression Mother   . Arthritis Father   . Stroke Sister  Review of Systems    see HPI.  Objective:   Physical Exam  Constitutional: She is oriented to person, place, and time. She appears well-developed and well-nourished.  Morbidly obese in wheelchair.   HENT:  Head: Normocephalic and atraumatic.  Right Ear: External ear normal.  Left Ear: External ear normal.  Nose: Nose normal.  Mouth/Throat: Oropharynx is clear and moist.  TM's clear. No sinus tenderness.   Eyes: Pupils are equal, round, and reactive to light. Conjunctivae and EOM are normal. Right eye  exhibits no discharge. Left eye exhibits no discharge.  Cardiovascular: Normal rate and regular rhythm.  Pulmonary/Chest: Effort normal. She has wheezes.  Noticed expiratory wheezing bilateral lungs some scant rhonchi diffusely through lungs as well.   Lymphadenopathy:    She has no cervical adenopathy.  Neurological: She is alert and oriented to person, place, and time.  Skin:  Right lower leg: open small blisters oozing with general anterior lower leg erythema and pitting edema. Very tender to palpation.   Left lower leg: large anterior leg blister with general anterior lower leg erythema and pitting edema. Very tender to palpation.   Psychiatric: She has a normal mood and affect. Her behavior is normal.          Assessment & Plan:  [name] KitchenMarland KitchenDiagnoses and all orders for this visit:  Acute bronchitis, unspecified organism -     doxycycline (VIBRA-TABS) 100 MG tablet; Take 1 tablet (100 mg total) by mouth 2 (two) times daily.  Pulmonary edema cardiac cause (HCC) -     torsemide (DEMADEX) 10 MG tablet; Take 1 tablet (10 mg total) by mouth 2 (two) times daily.  Mild episode of recurrent major depressive disorder (HCC) -     DULoxetine (CYMBALTA) 60 MG capsule; Take 2 capsules (120 mg total) by mouth daily.  Mixed hyperlipidemia -     Lipid Panel w/reflex Direct LDL -     atorvastatin (LIPITOR) 40 MG tablet; TAKE 1 TABLET BY MOUTH ONCE DAILY AT 6 PM.  Medication management -     COMPLETE METABOLIC PANEL WITH GFR  Bilateral cellulitis of lower leg -     doxycycline (VIBRA-TABS) 100 MG tablet; Take 1 tablet (100 mg total) by mouth 2 (two) times daily.  Idiopathic chronic gout without tophus, unspecified site -     allopurinol (ZYLOPRIM) 300 MG tablet; TAKE 1 TABLET BY MOUTH ONCE DAILY. *DUE FORE FOLLOW UP APPOINTMENT IN AUGUST*  Other orders -     lisinopril (PRINIVIL,ZESTRIL) 2.5 MG tablet; TAKE 1 TABLET BY MOUTH ONCE DAILY. -     metoprolol tartrate (LOPRESSOR) 100 MG tablet; Take  1 tablet (100 mg total) by mouth 2 (two) times daily.   Pt needed some refills. Ordered labs and gave refills.   Discussed bronchitis. Started doxycycline. Continue mucinex and tylenol. Rest and hydrate. If not improving will consider inhaler.   Placed in bilateral unna boot with 1 week recheck. Started doxycycline for cellulitis. Keep feet elevated. Overall this is a compression problem. Dr. Karie Schwalbe did order home health again to do more frequent leg wraps but she declined these services. She was ordered home compression stockings but she has trouble getting them on.

## 2018-08-10 NOTE — Progress Notes (Addendum)
HPI: FU CHF and atrial fibrillation. Patient had a Myoview on 03/17/2012 for risk stratification. There was no ischemia. However the patient was noted to be in atrial fibrillation which was a new diagnosis. Decision made for rate control and anticoagulation. Holter 8/15 showed a fib rate controlled. ABIs 9/15 normal; venous dopplers negative. Echocardiogram September 2017 was technically difficult. LV function felt to be normal. Mild left atrial enlargement and mild tricuspid regurgitation. Since last seen, she has occasional dyspnea.  Recent URI.  She denies exertional chest pain, palpitations or syncope.  Pedal edema is worse.  Current Outpatient Medications  Medication Sig Dispense Refill  . allopurinol (ZYLOPRIM) 300 MG tablet TAKE 1 TABLET BY MOUTH ONCE DAILY. *DUE FORE FOLLOW UP APPOINTMENT IN AUGUST* 90 tablet 0  . AMBULATORY NON FORMULARY MEDICATION Knee-high, medium compression, graduated compression stockings. Apply to lower extremities. Zippered Compression Stockings, large circ, long length/knee high 1 each 0  . AMBULATORY NON FORMULARY MEDICATION Need for one power wheelchair due to balance instability, lower extremity weakness, and lumbar DDD.  DX: R26.89, R29.898, M51.36 1 Device 0  . AMBULATORY NON FORMULARY MEDICATION Need for one power wheelchair due to balance instability, lower extremity weakness, and lumbar DDD.  DX: R26.89, R29.898, M51.36 1 Device 0  . atorvastatin (LIPITOR) 40 MG tablet TAKE 1 TABLET BY MOUTH ONCE DAILY AT 6 PM. 90 tablet 1  . Bismuth Tribromoph-Petrolatum (XEROFORM PETROLAT PATCH 4"X4") PADS Apply to irritated skin daily, replace daily as needed. 30 each 1  . clobetasol ointment (TEMOVATE) 0.05 % Apply 1 application topically 2 (two) times daily. (Patient taking differently: Apply 1 application topically as directed. ) 45 g 3  . clonazePAM (KLONOPIN) 1 MG tablet TAKE ONE-HALF TO 1 TABLET BY MOUTH TWICE DAILY AS NEEDED FOR ANXIETY 30 tablet 0  .  diclofenac sodium (VOLTAREN) 1 % GEL Apply 4 g topically 4 (four) times daily. 1 Tube 5  . doxycycline (VIBRA-TABS) 100 MG tablet Take 1 tablet (100 mg total) by mouth 2 (two) times daily. 20 tablet 0  . Dulaglutide (TRULICITY) 0.75 MG/0.5ML SOPN INJECT 1  SUBCUTANEOUSLY ONCE A WEEK 12 pen 1  . DULoxetine (CYMBALTA) 60 MG capsule Take 2 capsules (120 mg total) by mouth daily. 60 capsule 3  . glimepiride (AMARYL) 4 MG tablet Take 1 tablet (4 mg total) by mouth 2 (two) times daily with a meal. 180 tablet 1  . glucose blood (ONE TOUCH ULTRA TEST) test strip Use to test blood sugar 2 times daily as instructed. Dx code: E11.9 300 each 3  . JANUMET XR [id number] MG TB24 TAKE 1 TABLET BY MOUTH ONCE DAILY 30 tablet 2  . lisinopril (PRINIVIL,ZESTRIL) 2.5 MG tablet TAKE 1 TABLET BY MOUTH ONCE DAILY. 90 tablet 4  . metoprolol tartrate (LOPRESSOR) 100 MG tablet Take 1 tablet (100 mg total) by mouth 2 (two) times daily. 180 tablet 1  . PRADAXA 150 MG CAPS capsule TAKE 1 CAPSULE BY MOUTH TWICE DAILY. NEED  APPOINTMENT  FOR  FURTHER  REFILLS 60 capsule 0  . pregabalin (LYRICA) 200 MG capsule Take 1 capsule (200 mg total) by mouth 3 (three) times daily. 90 capsule 3  . tiZANidine (ZANAFLEX) 4 MG tablet Take one tablet as needed for every 6-8 hours for muscle spasms and pain. 90 tablet 1  . torsemide (DEMADEX) 10 MG tablet Take 1 tablet (10 mg total) by mouth 2 (two) times daily. 60 tablet 0  . traMADol (ULTRAM) 50 MG tablet Take  1 tablet (50 mg total) by mouth every 8 (eight) hours. 90 tablet 1  . traZODone (DESYREL) 50 MG tablet Take 0.5-1 tablets (25-50 mg total) by mouth at bedtime as needed for sleep. 30 tablet 0   No current facility-administered medications for this visit.      Past Medical History:  Diagnosis Date  . Anxiety and depression 09/11/2011  . Arthritis   . Atrial fibrillation (HCC)   . Chronic insomnia 09/11/2011  . CKD (chronic kidney disease) stage 3, GFR 30-59 ml/min (HCC) 08/07/2017  .  Diabetes mellitus   . Fibromyalgia 09/11/2011  . Gait disorder 12/21/2011  . Gout 09/11/2011  . Hyperlipidemia   . Hypertension   . Normocytic anemia 08/07/2017  . OAB (overactive bladder) 06/21/2012  . Obesity 09/11/2011  . Peripheral edema 06/21/2012  . Sciatica of right side 09/11/2011  . Spinal stenosis 09/11/2011  . Thyroid disease   . Urine incontinence     Past Surgical History:  Procedure Laterality Date  . REPLACEMENT TOTAL KNEE BILATERAL     right approx 1994, left approx 1999  . SHOULDER SURGERY     right - approx 2005  . THYROIDECTOMY, PARTIAL     at 77yo    Social History   Socioeconomic History  . Marital status: Married    Spouse name: Not on file  . Number of children: 3  . Years of education: Not on file  . Highest education level: Not on file  Occupational History  . Not on file  Social Needs  . Financial resource strain: Not on file  . Food insecurity:    Worry: Not on file    Inability: Not on file  . Transportation needs:    Medical: Not on file    Non-medical: Not on file  Tobacco Use  . Smoking status: Never Smoker  . Smokeless tobacco: Never Used  Substance and Sexual Activity  . Alcohol use: No  . Drug use: No  . Sexual activity: Not on file  Lifestyle  . Physical activity:    Days per week: Not on file    Minutes per session: Not on file  . Stress: Not on file  Relationships  . Social connections:    Talks on phone: Not on file    Gets together: Not on file    Attends religious service: Not on file    Active member of club or organization: Not on file    Attends meetings of clubs or organizations: Not on file    Relationship status: Not on file  . Intimate partner violence:    Fear of current or ex partner: Not on file    Emotionally abused: Not on file    Physically abused: Not on file    Forced sexual activity: Not on file  Other Topics Concern  . Not on file  Social History Narrative  . Not on file    Family History    Problem Relation Age of Onset  . Arthritis Mother   . Depression Mother   . Arthritis Father   . Stroke Sister     ROS: no fevers or chills, productive cough, hemoptysis, dysphasia, odynophagia, melena, hematochezia, dysuria, hematuria, rash, seizure activity, orthopnea, PND, claudication. Remaining systems are negative.  Physical Exam: Well-developed morbidly obese in no acute distress.  Skin is warm and dry.  HEENT is normal.  Neck is supple.  Chest is clear to auscultation with normal expansion.  Cardiovascular exam is irregular Abdominal exam nontender  or distended. No masses palpated. Extremities show 2+ edema, erythema; draining blisters. neuro grossly intact  ECG-atrial fibrillation at a rate of 76.  Right axis deviation.  Low voltage.  Cannot rule out prior anterior infarct.  Personally reviewed  A/P  1 permanent atrial fibrillation-plan to continue beta-blocker for rate control.  Discontinue Pradaxa.  Instead treat with apixaban 5 mg twice daily.  2 hypertension-patient's blood pressure is elevated; increase lisinopril to 5 mg daily.  3 hyperlipidemia-continue statin.  4 peripheral edema-pedal edema is worse.  She also has draining ulcers.  Change Demadex to 40 mg daily for 3 days then 20 mg daily thereafter.  In 1 week check potassium and renal function.  Patient is to see primary care tomorrow and will need her legs wrapped.  We discussed the importance of fluid restriction and low-sodium diet.  5 morbid obesity-we discussed the importance of weight loss.  6 possible sleep apnea-seen by pulmonary previously but did not follow-up.  I have encouraged her to do this but I am not optimistic that she will comply.  Olga Millers, MD

## 2018-08-12 ENCOUNTER — Ambulatory Visit (INDEPENDENT_AMBULATORY_CARE_PROVIDER_SITE_OTHER): Payer: Medicare Other | Admitting: Sports Medicine

## 2018-08-12 ENCOUNTER — Encounter: Payer: Self-pay | Admitting: Sports Medicine

## 2018-08-12 DIAGNOSIS — I872 Venous insufficiency (chronic) (peripheral): Secondary | ICD-10-CM | POA: Diagnosis not present

## 2018-08-12 DIAGNOSIS — M19012 Primary osteoarthritis, left shoulder: Secondary | ICD-10-CM | POA: Diagnosis not present

## 2018-08-12 MED ORDER — HYDROCODONE-ACETAMINOPHEN 5-325 MG PO TABS: 1.0000 | ORAL_TABLET | Freq: Three times a day (TID) | ORAL | 0 refills | Status: DC | PRN

## 2018-08-12 NOTE — Progress Notes (Signed)
Subjective:    CC: Follow-up  HPI: Left glenohumeral osteoarthritis: End-stage, injections only provide a few weeks of relief, shoulder arthroplasty on the right side, does not desire to go through it on the left.  Neck pain: With left cervical radiculitis, end-stage, severe DDD, multilevel.  Does not want any aggressive intervention.  Venous stasis ulcers: Apparently the home health agency did not do quality Unna boot wrap job.  She prefers to get them done here, she has appointments here at least once a week anyway and only has a ride on Thursdays.  I reviewed the past medical history, family history, social history, surgical history, and allergies today and no changes were needed.  Please see the problem list section below in epic for further details.  Past Medical History: Past Medical History:  Diagnosis Date  . Anxiety and depression 09/11/2011  . Arthritis   . Atrial fibrillation (HCC)   . Chronic insomnia 09/11/2011  . CKD (chronic kidney disease) stage 3, GFR 30-59 ml/min (HCC) 08/07/2017  . Diabetes mellitus   . Fibromyalgia 09/11/2011  . Gait disorder 12/21/2011  . Gout 09/11/2011  . Hyperlipidemia   . Hypertension   . Normocytic anemia 08/07/2017  . OAB (overactive bladder) 06/21/2012  . Obesity 09/11/2011  . Peripheral edema 06/21/2012  . Sciatica of right side 09/11/2011  . Spinal stenosis 09/11/2011  . Thyroid disease   . Urine incontinence    Past Surgical History: Past Surgical History:  Procedure Laterality Date  . REPLACEMENT TOTAL KNEE BILATERAL     right approx 1994, left approx 1999  . SHOULDER SURGERY     right - approx 2005  . THYROIDECTOMY, PARTIAL     at 77yo   Social History: Social History   Socioeconomic History  . Marital status: Married    Spouse name: Not on file  . Number of children: 3  . Years of education: Not on file  . Highest education level: Not on file  Occupational History  . Not on file  Social Needs  . Financial  resource strain: Not on file  . Food insecurity:    Worry: Not on file    Inability: Not on file  . Transportation needs:    Medical: Not on file    Non-medical: Not on file  Tobacco Use  . Smoking status: Never Smoker  . Smokeless tobacco: Never Used  Substance and Sexual Activity  . Alcohol use: No  . Drug use: No  . Sexual activity: Not on file  Lifestyle  . Physical activity:    Days per week: Not on file    Minutes per session: Not on file  . Stress: Not on file  Relationships  . Social connections:    Talks on phone: Not on file    Gets together: Not on file    Attends religious service: Not on file    Active member of club or organization: Not on file    Attends meetings of clubs or organizations: Not on file    Relationship status: Not on file  Other Topics Concern  . Not on file  Social History Narrative  . Not on file   Family History: Family History  Problem Relation Age of Onset  . Arthritis Mother   . Depression Mother   . Arthritis Father   . Stroke Sister    Allergies: Allergies  Allergen Reactions  . Glipizide     diarrhea  . Myrbetriq [Mirabegron]     Could not get  a deep breath.   . Voltaren [Diclofenac Sodium]     Rash   Medications: See med rec.  Review of Systems: No fevers, chills, night sweats, weight loss, chest pain, or shortness of breath.   Objective:    General: Well Developed, well nourished, and in no acute distress.  Neuro: Alert and oriented x3, extra-ocular muscles intact, sensation grossly intact.  HEENT: Normocephalic, atraumatic, pupils equal round reactive to light, neck supple, no masses, no lymphadenopathy, thyroid nonpalpable.  Skin: Warm and dry, no rashes. Cardiac: Regular rate and rhythm, no murmurs rubs or gallops, no lower extremity edema.  Respiratory: Clear to auscultation bilaterally. Not using accessory muscles, speaking in full sentences.  Impression and Recommendations:    Primary osteoarthritis of  left shoulder Fantastic relief for a couple of weeks. End-stage glenohumeral osteoarthritis. Not an operative candidate. Also with end-stage cervical degenerative disc disease. Not better with physical therapy. I am going to start her on chronic pain management, starting with Norco 5/325 up to use 2-3 times per day, I can refill it if it works well. Discontinue tramadol for now.  Venous stasis dermatitis of both lower extremities Needs to continue with Unna boots weekly.  I spent 25 minutes with this patient, greater than 50% was face-to-face time counseling regarding the above diagnoses, we specifically discussed the natural history and end-stage treatment of osteoarthritis when surgical management is not an option. ___________________________________________ Ihor Austin. Benjamin Stain, M.D., ABFM., CAQSM. Primary Care and Sports Medicine [facility]  Adjunct Instructor of Family Medicine  [employer]

## 2018-08-12 NOTE — Assessment & Plan Note (Signed)
Fantastic relief for a couple of weeks. End-stage glenohumeral osteoarthritis. Not an operative candidate. Also with end-stage cervical degenerative disc disease. Not better with physical therapy. I am going to start her on chronic pain management, starting with Norco 5/325 up to use 2-3 times per day, I can refill it if it works well. Discontinue tramadol for now.

## 2018-08-12 NOTE — Assessment & Plan Note (Signed)
Needs to continue with Unna boots weekly.

## 2018-08-16 ENCOUNTER — Ambulatory Visit: Payer: Medicare Other | Admitting: Physician Assistant

## 2018-08-17 ENCOUNTER — Other Ambulatory Visit: Payer: Self-pay | Admitting: Cardiology

## 2018-08-18 ENCOUNTER — Encounter: Payer: Self-pay | Admitting: Cardiology

## 2018-08-18 ENCOUNTER — Ambulatory Visit (INDEPENDENT_AMBULATORY_CARE_PROVIDER_SITE_OTHER): Payer: Medicare Other | Admitting: Cardiology

## 2018-08-18 VITALS — BP 146/79 | HR 85

## 2018-08-18 DIAGNOSIS — I501 Left ventricular failure: Secondary | ICD-10-CM | POA: Diagnosis not present

## 2018-08-18 DIAGNOSIS — I4891 Unspecified atrial fibrillation: Secondary | ICD-10-CM

## 2018-08-18 DIAGNOSIS — E7800 Pure hypercholesterolemia, unspecified: Secondary | ICD-10-CM

## 2018-08-18 DIAGNOSIS — I10 Essential (primary) hypertension: Secondary | ICD-10-CM | POA: Diagnosis not present

## 2018-08-18 MED ORDER — LISINOPRIL 5 MG PO TABS: ORAL_TABLET | ORAL | 3 refills | Status: AC

## 2018-08-18 MED ORDER — APIXABAN 5 MG PO TABS: 5.0000 mg | ORAL_TABLET | Freq: Two times a day (BID) | ORAL | 6 refills | Status: AC

## 2018-08-18 MED ORDER — TORSEMIDE 20 MG PO TABS: 20.0000 mg | ORAL_TABLET | Freq: Every day | ORAL | 3 refills | Status: AC

## 2018-08-18 NOTE — Patient Instructions (Signed)
Medication Instructions:   STOP PRADAXA  START ELQIUIS 5 MG TWICE DAILY  INCREASE LISINOPRIL TO 5 MG ONCE DAILY= 2 OF THE 2.5 MG TABLETS ONCE DAILY  INCREASE TORSEMIDE TO 40 MG ONCE DAILY FOR THE NEXT 3 DAYS THEN 20 MG ONCE DAILY  Labwork:  Your physician recommends that you return for lab work in: ONE WEEK  Follow-Up:  Your physician recommends that you schedule a follow-up appointment in: 3 MONTHS WITH DR Jens Som   If you need a refill on your cardiac medications before your next appointment, please call your pharmacy.

## 2018-08-19 ENCOUNTER — Encounter: Payer: Self-pay | Admitting: Physician Assistant

## 2018-08-19 ENCOUNTER — Ambulatory Visit (INDEPENDENT_AMBULATORY_CARE_PROVIDER_SITE_OTHER): Payer: Medicare Other | Admitting: Physician Assistant

## 2018-08-19 VITALS — BP 91/47 | HR 72 | Ht 67.02 in

## 2018-08-19 DIAGNOSIS — I872 Venous insufficiency (chronic) (peripheral): Secondary | ICD-10-CM | POA: Diagnosis not present

## 2018-08-19 DIAGNOSIS — L03119 Cellulitis of unspecified part of limb: Secondary | ICD-10-CM

## 2018-08-19 NOTE — Patient Instructions (Signed)
Thursday nurse visit standing appt for legs to be wrapped.

## 2018-08-23 ENCOUNTER — Encounter: Payer: Self-pay | Admitting: Physician Assistant

## 2018-08-23 NOTE — Progress Notes (Signed)
Subjective:    Patient ID: Bonnie Clark, female    DOB: 1941/09/26, 77 y.o.   MRN: [id number]  HPI Pt is a 77 yo morbidly obese female with HTN, CHF, A.fib,  chronic venous insuffiency with recurrent cellulitis who presents to the cliinic for follow up. 2 weeks ago she was given doxycycline and wrapped in unna boots. She took the boots off one week ago. When she took boots off her legs looked great. Over the past week they have started to look a little redder and one blister formed and popped on left anterior leg. No fever. Dr. Jens Som added toresmide to replace lasix. It is hard for patient to keep compression on her legs. She has declined home health referral.   .. Active Ambulatory Problems    Diagnosis Date Noted  . Depression 09/11/2011  . Arthritis   . Type 2 diabetes mellitus (HCC)   . Hyperlipidemia   . Hypertension   . Thyroid disease   . Morbidly obese (HCC) 09/11/2011  . Chronic insomnia 09/11/2011  . Chronic pain 09/11/2011  . Fibromyalgia 09/11/2011  . Anxiety and depression 09/11/2011  . Fatigue 09/11/2011  . Gout 09/11/2011  . Gait disorder 12/21/2011  . Atrial fibrillation 03/19/2012  . Cardiomyopathy (HCC) 04/21/2012  . Peripheral edema 06/21/2012  . OAB (overactive bladder) 06/21/2012  . Hypersomnolence 06/21/2012  . Other malaise and fatigue 02/14/2013  . Pre-ulcerative corn or callous 02/14/2013  . Osteoarthritis of both hips 10/11/2013  . Lumbar degenerative disc disease 10/11/2013  . Renal insufficiency 03/15/2014  . Venous stasis dermatitis of both lower extremities 04/03/2014  . Physical deconditioning 05/31/2014  . Ganglion cyst 03/12/2015  . Balance problems 03/06/2017  . Weakness of lower extremity 03/06/2017  . Cellulitis of lower extremity 05/13/2017  . Lower leg edema 05/13/2017  . Primary osteoarthritis of left shoulder 06/08/2017  . Mass of forearm, left 06/08/2017  . Acute on chronic congestive heart failure (HCC) 08/06/2017  . CKD (chronic  kidney disease) stage 3, GFR 30-59 ml/min (HCC) 08/07/2017  . Normocytic anemia 08/07/2017  . SOB (shortness of breath) 08/07/2017  . Low hemoglobin 08/11/2017  . Chronic pulmonary edema 08/13/2017  . Weakness of both lower extremities 10/17/2017  . Hypoglycemia 03/04/2018  . Shaky 03/04/2018  . Left cervical radiculopathy 07/15/2018  . Pulmonary edema cardiac cause (HCC) 08/09/2018   Resolved Ambulatory Problems    Diagnosis Date Noted  . Spinal stenosis 09/11/2011  . Sciatica of right side 09/11/2011  . Chest pain 03/05/2012  . Urinary incontinence 03/05/2012  . Abscess of heel, left 02/14/2013  . Osteoarthritis of right hip 01/30/2014  . Pressure ulcer stage II 03/12/2015  . Olecranon bursitis of left elbow 06/29/2015  . Venous stasis ulcer of thigh limited to breakdown of skin with varicose veins (HCC) 12/16/2016  . Decubitus ulcer of left buttock, stage 2 08/13/2017   Past Medical History:  Diagnosis Date  . Diabetes mellitus   . Gout 09/11/2011  . Obesity 09/11/2011  . Urine incontinence       Review of Systems See HPI>     Objective:   Physical Exam  Constitutional: She is oriented to person, place, and time. She appears well-developed.  Non ambulatory in wheelchair.   HENT:  Head: Normocephalic and atraumatic.  Cardiovascular: Normal rate.  Pulmonary/Chest: Effort normal and breath sounds normal.  Neurological: She is alert and oriented to person, place, and time.  Skin:  Bilateral lower anterior legs eyrthema but not warm today. Left anterior  leg popped blister that is draining some. Overall 2+ stable edema.   Psychiatric: She has a normal mood and affect. Her behavior is normal.          Assessment & Plan:  [name] KitchenMarland KitchenDiagnoses and all orders for this visit:  Chronic venous stasis dermatitis of both lower extremities  Cellulitis of lower extremity, unspecified laterality   Cellulitis seemed to have cleared. Keeping legs up wrapped has subjected her to  some blister formation and draining but does not appear to be infection. Pt really needs weekly wrapping. She declines home health. She wants to come into office. Needs to come in weekly for unna boot wrappings. No abx needed today. Hopefully change in diuretic will also help to keep more fluid off legs. Keep legs elevated.

## 2018-08-27 ENCOUNTER — Ambulatory Visit: Payer: Medicare Other

## 2018-08-29 ENCOUNTER — Other Ambulatory Visit: Payer: Self-pay | Admitting: Physician Assistant

## 2018-08-29 DIAGNOSIS — M19012 Primary osteoarthritis, left shoulder: Secondary | ICD-10-CM

## 2018-08-30 ENCOUNTER — Ambulatory Visit (INDEPENDENT_AMBULATORY_CARE_PROVIDER_SITE_OTHER): Payer: Medicare Other | Admitting: Physician Assistant

## 2018-08-30 ENCOUNTER — Encounter: Payer: Self-pay | Admitting: Physician Assistant

## 2018-08-30 VITALS — BP 139/71 | HR 88

## 2018-08-30 DIAGNOSIS — I872 Venous insufficiency (chronic) (peripheral): Secondary | ICD-10-CM | POA: Diagnosis not present

## 2018-08-30 NOTE — Progress Notes (Signed)
Bonnie Clark present to the clinic for bilateral LE unna boot. Open wounds noted on both legs.  Pt was evaluated by PCP.  Two unna boots and two ace wraps applied to both LE without complications.  Pt advised to return next Thursday. -EH/RMA  Agree with above plan. Pt declined home health and wound clinic referral. Tandy Gaw PA-c

## 2018-09-09 ENCOUNTER — Other Ambulatory Visit: Payer: Self-pay | Admitting: Physician Assistant

## 2018-09-09 ENCOUNTER — Ambulatory Visit (INDEPENDENT_AMBULATORY_CARE_PROVIDER_SITE_OTHER): Payer: Medicare Other | Admitting: Osteopathic Medicine

## 2018-09-09 DIAGNOSIS — I872 Venous insufficiency (chronic) (peripheral): Secondary | ICD-10-CM | POA: Diagnosis not present

## 2018-09-09 DIAGNOSIS — M19012 Primary osteoarthritis, left shoulder: Secondary | ICD-10-CM

## 2018-09-09 DIAGNOSIS — E782 Mixed hyperlipidemia: Secondary | ICD-10-CM | POA: Diagnosis not present

## 2018-09-09 DIAGNOSIS — Z79899 Other long term (current) drug therapy: Secondary | ICD-10-CM | POA: Diagnosis not present

## 2018-09-09 NOTE — Progress Notes (Signed)
Patient presents to clinic today for bilateral LE unna boot. No open wounds on legs. Patient states her legs look and feel much better than they have in the past few weeks. Patient was evaluated by Dr. Lyn Hollingshead. Two unna boots and two ace wraps applied to both LE without complications. Patient advised to return next Thursday.

## 2018-09-09 NOTE — Telephone Encounter (Signed)
Medication request came up today for Bonnie Clark. She is needing a refill on her Lyrica.

## 2018-09-10 LAB — COMPLETE METABOLIC PANEL WITH GFR
AG Ratio: 1.5 (calc) (ref 1.0–2.5)
ALBUMIN MSPROF: 4 g/dL (ref 3.6–5.1)
ALKALINE PHOSPHATASE (APISO): 79 U/L (ref 33–130)
ALT: 14 U/L (ref 6–29)
AST: 16 U/L (ref 10–35)
BUN / CREAT RATIO: 25 (calc) — AB (ref 6–22)
BUN: 29 mg/dL — ABNORMAL HIGH (ref 7–25)
CALCIUM: 9.2 mg/dL (ref 8.6–10.4)
CHLORIDE: 101 mmol/L (ref 98–110)
CO2: 28 mmol/L (ref 20–32)
Creat: 1.14 mg/dL — ABNORMAL HIGH (ref 0.60–0.93)
GFR, Est African American: 54 mL/min/{1.73_m2} — ABNORMAL LOW (ref >=60)
GFR, Est Non African American: 47 mL/min/{1.73_m2} — ABNORMAL LOW (ref >=60)
GLOBULIN: 2.7 g/dL (ref 1.9–3.7)
Glucose, Bld: 101 mg/dL (ref 65–139)
Potassium: 5 mmol/L (ref 3.5–5.3)
SODIUM: 137 mmol/L (ref 135–146)
Total Bilirubin: 0.6 mg/dL (ref 0.2–1.2)
Total Protein: 6.7 g/dL (ref 6.1–8.1)

## 2018-09-10 LAB — LIPID PANEL W/REFLEX DIRECT LDL
CHOL/HDL RATIO: 2.9 (calc) (ref <5.0)
CHOLESTEROL: 117 mg/dL (ref <200)
HDL: 40 mg/dL — ABNORMAL LOW (ref >50)
LDL Cholesterol (Calc): 51 mg/dL (calc)
Non-HDL Cholesterol (Calc): 77 mg/dL (calc) (ref <130)
Triglycerides: 181 mg/dL — ABNORMAL HIGH (ref <150)

## 2018-09-16 ENCOUNTER — Ambulatory Visit: Payer: Medicare Other

## 2018-09-22 ENCOUNTER — Other Ambulatory Visit: Payer: Self-pay | Admitting: Physician Assistant

## 2018-09-25 IMAGING — DX DG CERVICAL SPINE COMPLETE 4+V
6 series · 6 of 6 positions shown · non-contrast
Comparison: None. Best images possible with this patient in
wheelchair.

CLINICAL DATA: Neck pain for 3 weeks with radiation to the left
arm, no injury

EXAM:
CERVICAL SPINE - COMPLETE 4+ VIEW

[c-spine lat]
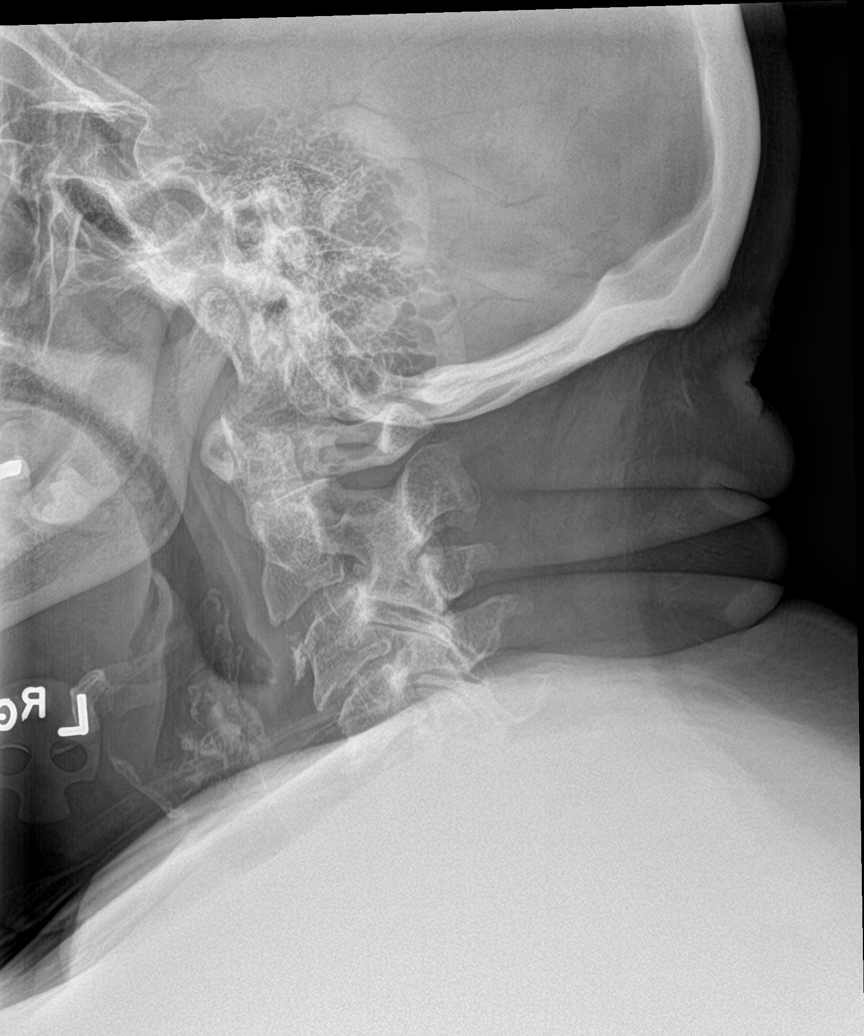

[c-spine obl (1 of 3)]
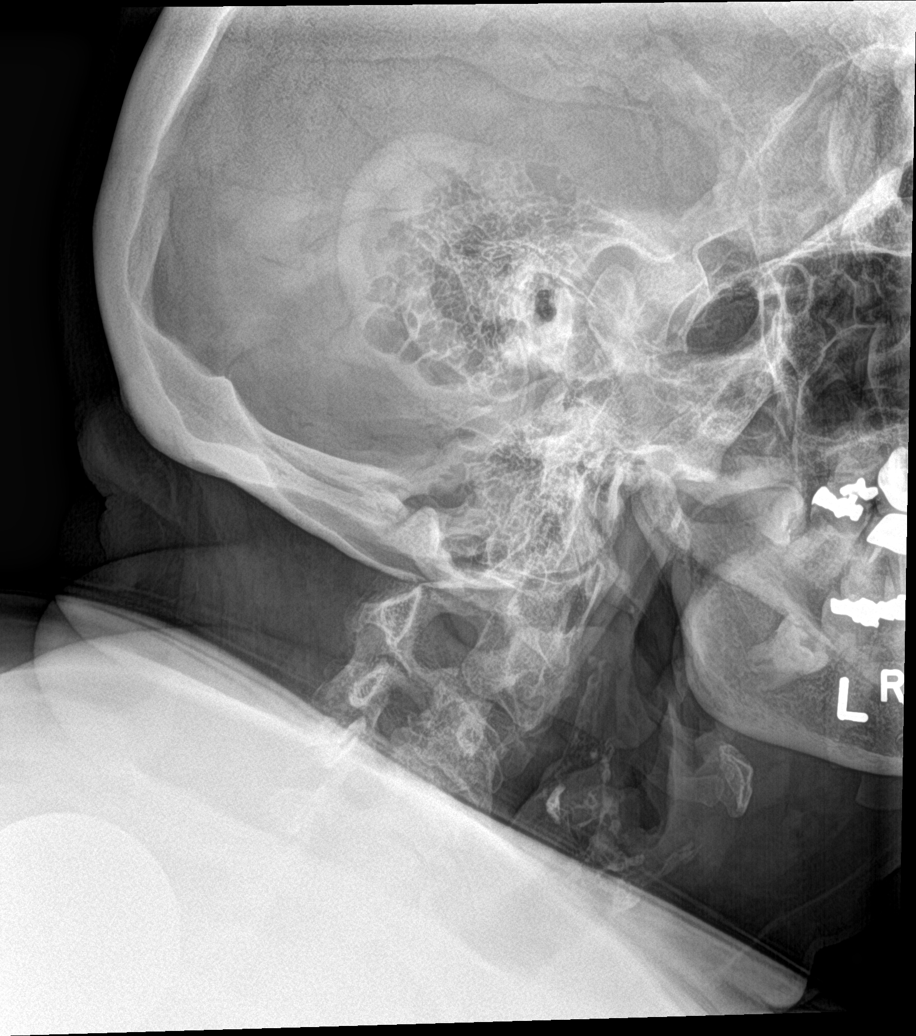

[c-spine obl (2 of 3)]
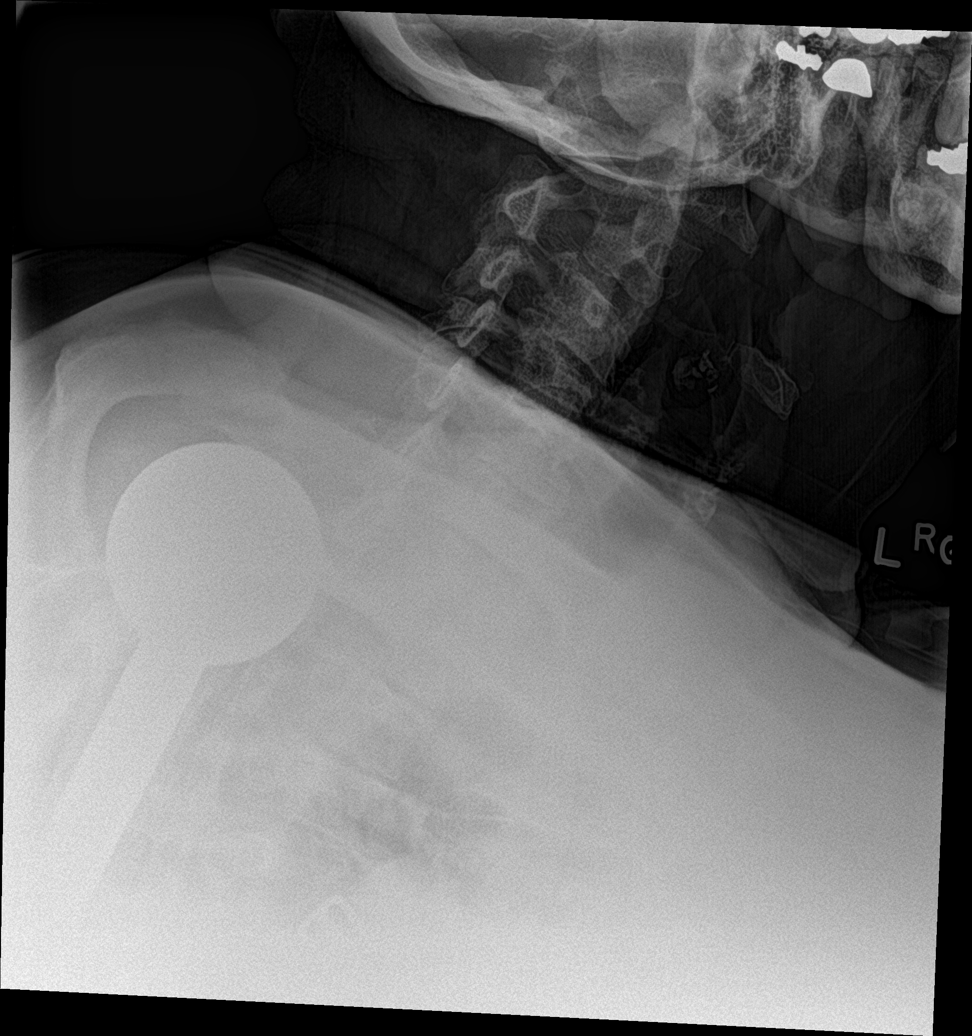

[c-spine ap]
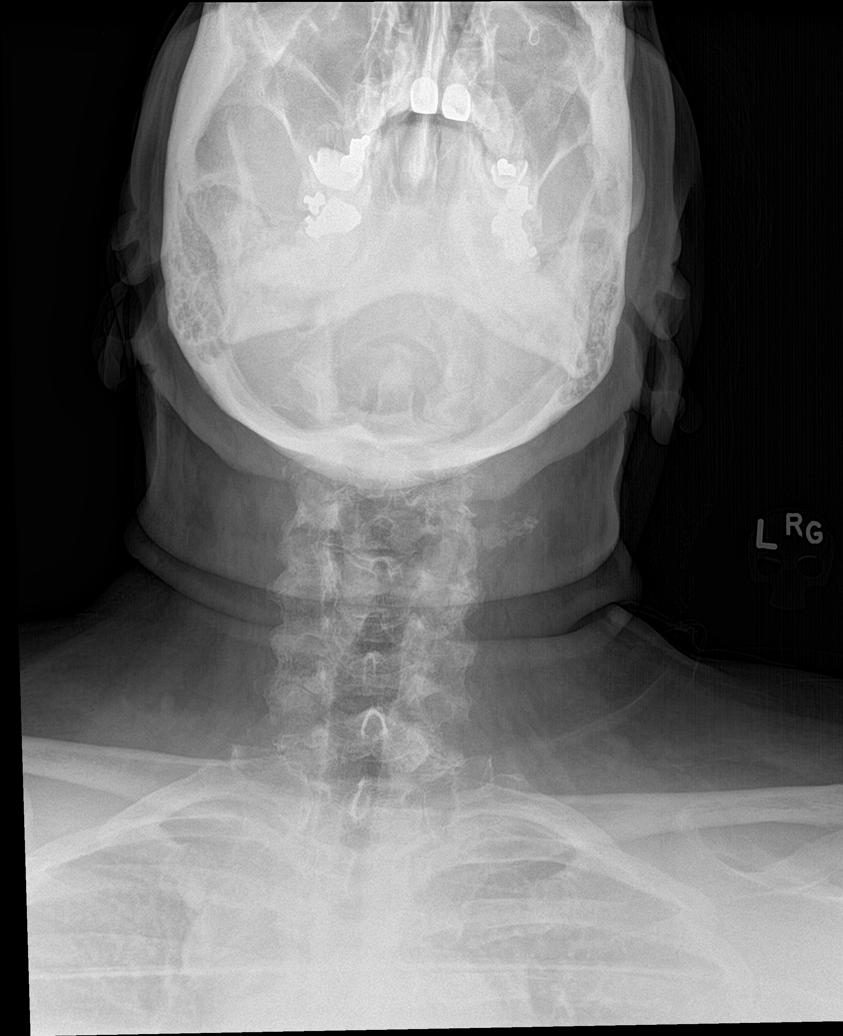

[c-spine open mouth]
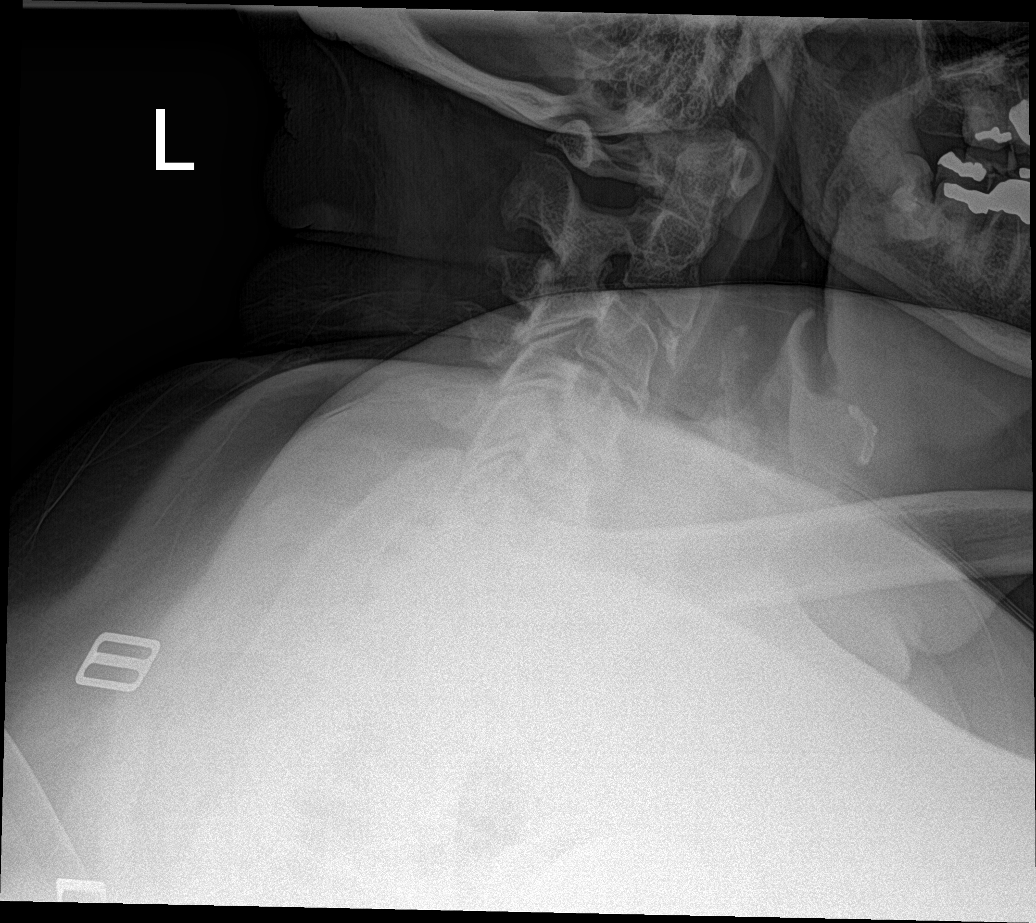

[c-spine obl (3 of 3)]
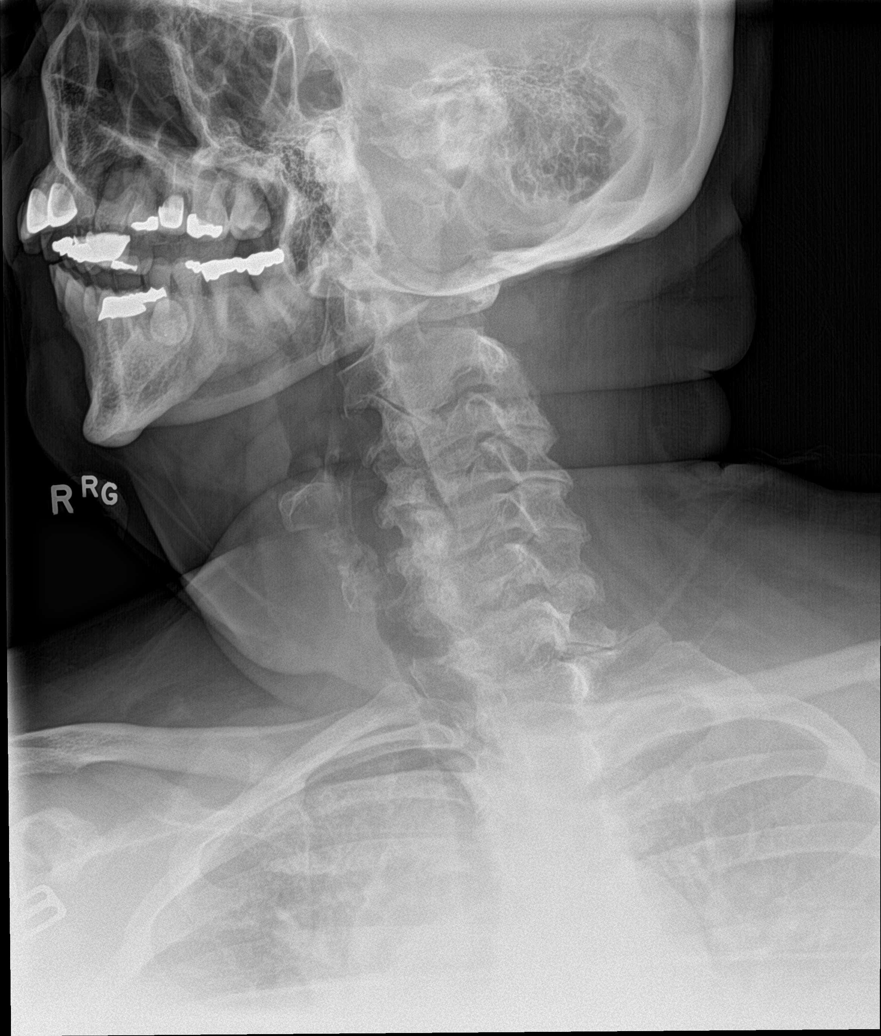

[6 of 6 positions shown; findings below may reference images not displayed]

FINDINGS: As result of the patient's shoulders, the lower cervical spine
cannot be assessed. C1 through C6 is visualized, and there is
degenerative disc disease diffusely involving C3-4, C4-5, and C5-6
level. The C6-7 level cannot be assessed. At the above levels there
is loss of disc space with sclerosis and spurring present. No
prevertebral soft tissue swelling is seen. Oblique views are
suboptimal but may indicate some foraminal narrowing in the lower
cervical spine. The odontoid process is intact. The lung apices are
clear.
IMPRESSION: 1. Due to the patient's shoulders, the lower cervical vertebrae
cannot be assessed.
2. Normal alignment from C1-C6 with diffuse degenerative disc
disease as noted above.

## 2018-09-30 NOTE — Progress Notes (Signed)
Letter mailed

## 2018-10-11 ENCOUNTER — Ambulatory Visit (INDEPENDENT_AMBULATORY_CARE_PROVIDER_SITE_OTHER): Payer: Medicare Other | Admitting: Physician Assistant

## 2018-10-11 ENCOUNTER — Encounter: Payer: Self-pay | Admitting: Physician Assistant

## 2018-10-11 VITALS — BP 128/67 | HR 76

## 2018-10-11 DIAGNOSIS — M19012 Primary osteoarthritis, left shoulder: Secondary | ICD-10-CM

## 2018-10-11 DIAGNOSIS — M1A00X0 Idiopathic chronic gout, unspecified site, without tophus (tophi): Secondary | ICD-10-CM | POA: Diagnosis not present

## 2018-10-11 DIAGNOSIS — I872 Venous insufficiency (chronic) (peripheral): Secondary | ICD-10-CM | POA: Diagnosis not present

## 2018-10-11 DIAGNOSIS — E1122 Type 2 diabetes mellitus with diabetic chronic kidney disease: Secondary | ICD-10-CM | POA: Diagnosis not present

## 2018-10-11 DIAGNOSIS — F5104 Psychophysiologic insomnia: Secondary | ICD-10-CM

## 2018-10-11 DIAGNOSIS — I429 Cardiomyopathy, unspecified: Secondary | ICD-10-CM

## 2018-10-11 DIAGNOSIS — N183 Chronic kidney disease, stage 3 unspecified: Secondary | ICD-10-CM

## 2018-10-11 MED ORDER — ALLOPURINOL 300 MG PO TABS: ORAL_TABLET | ORAL | 3 refills | Status: AC

## 2018-10-11 NOTE — Patient Instructions (Signed)
Follow-up in 3 weeks

## 2018-10-11 NOTE — Progress Notes (Signed)
Subjective:    Patient ID: Bonnie Clark, female    DOB: 09-12-1941, 77 y.o.   MRN: [id number]  HPI  Pt is a 77 yo morbidly female with chronic venous stasis, Cardiomyopathy, bilateral leg edema, gout,CKD,  OA who presents to the clinic for follow up on bilateral leg edema and sores on legs.   Her last unna boot was 10/17 and just for the last few days have ulcers and redness started to form. She admits she does not take her diuretic like she should. She does not like urinating so much. She will take it around 4 out of 7 days. 10/17 her potassium was 5.   Pt reports Dr. Jens Som her cardiologist did increase her lisinopril at last visit.   Been to wound care didn't like the machine  .[name] Kitchen Active Ambulatory Problems    Diagnosis Date Noted  . Depression 09/11/2011  . Arthritis   . Type 2 diabetes mellitus (HCC)   . Hyperlipidemia   . Hypertension   . Thyroid disease   . Morbidly obese (HCC) 09/11/2011  . Chronic insomnia 09/11/2011  . Chronic pain 09/11/2011  . Fibromyalgia 09/11/2011  . Anxiety and depression 09/11/2011  . Fatigue 09/11/2011  . Gout 09/11/2011  . Gait disorder 12/21/2011  . Atrial fibrillation 03/19/2012  . Cardiomyopathy (HCC) 04/21/2012  . Peripheral edema 06/21/2012  . OAB (overactive bladder) 06/21/2012  . Hypersomnolence 06/21/2012  . Other malaise and fatigue 02/14/2013  . Pre-ulcerative corn or callous 02/14/2013  . Osteoarthritis of both hips 10/11/2013  . Lumbar degenerative disc disease 10/11/2013  . Renal insufficiency 03/15/2014  . Venous stasis dermatitis of both lower extremities 04/03/2014  . Physical deconditioning 05/31/2014  . Ganglion cyst 03/12/2015  . Balance problems 03/06/2017  . Weakness of lower extremity 03/06/2017  . Cellulitis of lower extremity 05/13/2017  . Lower leg edema 05/13/2017  . Primary osteoarthritis of left shoulder 06/08/2017  . Mass of forearm, left 06/08/2017  . Acute on chronic congestive heart failure (HCC)  08/06/2017  . CKD (chronic kidney disease) stage 3, GFR 30-59 ml/min (HCC) 08/07/2017  . Normocytic anemia 08/07/2017  . SOB (shortness of breath) 08/07/2017  . Low hemoglobin 08/11/2017  . Chronic pulmonary edema 08/13/2017  . Weakness of both lower extremities 10/17/2017  . Hypoglycemia 03/04/2018  . Shaky 03/04/2018  . Left cervical radiculopathy 07/15/2018  . Pulmonary edema cardiac cause (HCC) 08/09/2018   Resolved Ambulatory Problems    Diagnosis Date Noted  . Spinal stenosis 09/11/2011  . Sciatica of right side 09/11/2011  . Chest pain 03/05/2012  . Urinary incontinence 03/05/2012  . Abscess of heel, left 02/14/2013  . Osteoarthritis of right hip 01/30/2014  . Pressure ulcer stage II 03/12/2015  . Olecranon bursitis of left elbow 06/29/2015  . Venous stasis ulcer of thigh limited to breakdown of skin with varicose veins (HCC) 12/16/2016  . Decubitus ulcer of left buttock, stage 2 (HCC) 08/13/2017   Past Medical History:  Diagnosis Date  . Diabetes mellitus   . Gout 09/11/2011  . Obesity 09/11/2011  . Urine incontinence      Review of Systems See HPI.     Objective:   Physical Exam  Constitutional: She is oriented to person, place, and time. She appears well-developed and well-nourished.  In wheelchair. Morbidly obese.   HENT:  Head: Normocephalic and atraumatic.  Cardiovascular: Normal rate and regular rhythm.  Murmur heard. Pulmonary/Chest: Effort normal and breath sounds normal.  Neurological: She is alert and oriented to person,  place, and time.  Skin:  Bilateral lymphedema in legs with some erythema and a few ulcers with some minor drainage on bilateral lower anterior legs.   Psychiatric: She has a normal mood and affect. Her behavior is normal.          Assessment & Plan:  [name] KitchenMarland KitchenDiagnoses and all orders for this visit:  Venous stasis dermatitis of both lower extremities  Cardiomyopathy, unspecified type (HCC)  CKD (chronic kidney disease) stage  3, GFR 30-59 ml/min (HCC)  Idiopathic chronic gout without tophus, unspecified site -     allopurinol (ZYLOPRIM) 300 MG tablet; TAKE 1 TABLET BY MOUTH ONCE DAILY.  Primary osteoarthritis of left shoulder -     pregabalin (LYRICA) 200 MG capsule; Take 1 capsule (200 mg total) by mouth 3 (three) times daily.  Type 2 diabetes mellitus with stage 3 chronic kidney disease, without long-term current use of insulin (HCC) -     SitaGLIPtin-MetFORMIN HCl (JANUMET XR) [phone] MG TB24; Take 1 tablet by mouth daily. -     Dulaglutide (TRULICITY) 0.75 MG/0.5ML SOPN; INJECT 1  SUBCUTANEOUSLY ONCE A WEEK  Chronic insomnia -     traZODone (DESYREL) 50 MG tablet; Take 0.5-1 tablets (25-50 mg total) by mouth at bedtime as needed for sleep.   Legs seem to need wrap every 3 weeks. Wrapped in bilateral unna boot today. Try to keep 3 week wrap up. obviously if took diuretic every day could get a few more days.   A!C is 7.0.  discussed some diet changes.  Keep medication the same for now.  Reminder to get eye exam done.   Pt declined flu shot and pneumonia vaccine today. Pt aware of risks.

## 2018-10-17 ENCOUNTER — Encounter: Payer: Self-pay | Admitting: Physician Assistant

## 2018-10-17 ENCOUNTER — Telehealth: Payer: Self-pay | Admitting: Physician Assistant

## 2018-10-17 MED ORDER — SITAGLIP PHOS-METFORMIN HCL ER 100-1000 MG PO TB24: 1.0000 | ORAL_TABLET | Freq: Every day | ORAL | 2 refills | Status: DC

## 2018-10-17 MED ORDER — PREGABALIN 200 MG PO CAPS: 200.0000 mg | ORAL_CAPSULE | Freq: Three times a day (TID) | ORAL | 3 refills | Status: DC

## 2018-10-17 MED ORDER — TRAZODONE HCL 50 MG PO TABS: 25.0000 mg | ORAL_TABLET | Freq: Every evening | ORAL | 0 refills | Status: DC | PRN

## 2018-10-17 MED ORDER — DULAGLUTIDE 0.75 MG/0.5ML ~~LOC~~ SOAJ: SUBCUTANEOUS | 1 refills | Status: AC

## 2018-10-17 NOTE — Telephone Encounter (Signed)
a1c was never put in at 7,0

## 2018-10-19 NOTE — Progress Notes (Deleted)
HPI: FU CHF and atrial fibrillation. Patient had a Myoview on 03/17/2012 for risk stratification. There was no ischemia. However the patient was noted to be in atrial fibrillation which was a new diagnosis. Decision made for rate control and anticoagulation. Holter 8/15 showed a fib rate controlled. ABIs 9/15 normal; venous dopplers negative. Echocardiogram September 2017 was technically difficult. LV function felt to be normal. Mild left atrial enlargement and mild tricuspid regurgitation.Since last seen,   Current Outpatient Medications  Medication Sig Dispense Refill  . allopurinol (ZYLOPRIM) 300 MG tablet TAKE 1 TABLET BY MOUTH ONCE DAILY. 90 tablet 3  . AMBULATORY NON FORMULARY MEDICATION Knee-high, medium compression, graduated compression stockings. Apply to lower extremities. Zippered Compression Stockings, large circ, long length/knee high 1 each 0  . AMBULATORY NON FORMULARY MEDICATION Need for one power wheelchair due to balance instability, lower extremity weakness, and lumbar DDD.  DX: R26.89, R29.898, M51.36 1 Device 0  . AMBULATORY NON FORMULARY MEDICATION Need for one power wheelchair due to balance instability, lower extremity weakness, and lumbar DDD.  DX: R26.89, R29.898, M51.36 1 Device 0  . apixaban (ELIQUIS) 5 MG TABS tablet Take 1 tablet (5 mg total) by mouth 2 (two) times daily. 60 tablet 6  . atorvastatin (LIPITOR) 40 MG tablet TAKE 1 TABLET BY MOUTH ONCE DAILY AT 6 PM. 90 tablet 1  . Bismuth Tribromoph-Petrolatum (XEROFORM PETROLAT PATCH 4"X4") PADS Apply to irritated skin daily, replace daily as needed. 30 each 1  . clobetasol ointment (TEMOVATE) 0.05 % Apply 1 application topically 2 (two) times daily. (Patient taking differently: Apply 1 application topically as directed. ) 45 g 3  . clonazePAM (KLONOPIN) 1 MG tablet TAKE ONE-HALF TO 1 TABLET BY MOUTH TWICE DAILY AS NEEDED FOR ANXIETY 30 tablet 0  . diclofenac sodium (VOLTAREN) 1 % GEL Apply 4 g topically 4  (four) times daily. 1 Tube 5  . Dulaglutide (TRULICITY) 0.75 MG/0.5ML SOPN INJECT 1  SUBCUTANEOUSLY ONCE A WEEK 12 pen 1  . DULoxetine (CYMBALTA) 60 MG capsule Take 2 capsules (120 mg total) by mouth daily. 60 capsule 3  . glimepiride (AMARYL) 4 MG tablet Take 1 tablet (4 mg total) by mouth 2 (two) times daily with a meal. 180 tablet 1  . glucose blood (ONE TOUCH ULTRA TEST) test strip Use to test blood sugar 2 times daily as instructed. Dx code: E11.9 300 each 3  . HYDROcodone-acetaminophen (NORCO/VICODIN) 5-325 MG tablet Take 1 tablet by mouth every 8 (eight) hours as needed for moderate pain. 15 tablet 0  . lisinopril (PRINIVIL,ZESTRIL) 5 MG tablet TAKE 1 TABLET BY MOUTH ONCE DAILY. 90 tablet 3  . metoprolol tartrate (LOPRESSOR) 100 MG tablet Take 1 tablet (100 mg total) by mouth 2 (two) times daily. 180 tablet 1  . pregabalin (LYRICA) 200 MG capsule Take 1 capsule (200 mg total) by mouth 3 (three) times daily. 90 capsule 3  . SitaGLIPtin-MetFORMIN HCl (JANUMET XR) [phone] MG TB24 Take 1 tablet by mouth daily. 30 tablet 2  . tiZANidine (ZANAFLEX) 4 MG tablet Take one tablet as needed for every 6-8 hours for muscle spasms and pain. 90 tablet 1  . torsemide (DEMADEX) 20 MG tablet Take 1 tablet (20 mg total) by mouth daily. 90 tablet 3  . traZODone (DESYREL) 50 MG tablet Take 0.5-1 tablets (25-50 mg total) by mouth at bedtime as needed for sleep. 30 tablet 0   No current facility-administered medications for this visit.      Past  Medical History:  Diagnosis Date  . Anxiety and depression 09/11/2011  . Arthritis   . Atrial fibrillation (HCC)   . Chronic insomnia 09/11/2011  . CKD (chronic kidney disease) stage 3, GFR 30-59 ml/min (HCC) 08/07/2017  . Diabetes mellitus   . Fibromyalgia 09/11/2011  . Gait disorder 12/21/2011  . Gout 09/11/2011  . Hyperlipidemia   . Hypertension   . Normocytic anemia 08/07/2017  . OAB (overactive bladder) 06/21/2012  . Obesity 09/11/2011  . Peripheral edema  06/21/2012  . Sciatica of right side 09/11/2011  . Spinal stenosis 09/11/2011  . Thyroid disease   . Urine incontinence     Past Surgical History:  Procedure Laterality Date  . REPLACEMENT TOTAL KNEE BILATERAL     right approx 1994, left approx 1999  . SHOULDER SURGERY     right - approx 2005  . THYROIDECTOMY, PARTIAL     at 77yo    Social History   Socioeconomic History  . Marital status: Married    Spouse name: Not on file  . Number of children: 3  . Years of education: Not on file  . Highest education level: Not on file  Occupational History  . Not on file  Social Needs  . Financial resource strain: Not on file  . Food insecurity:    Worry: Not on file    Inability: Not on file  . Transportation needs:    Medical: Not on file    Non-medical: Not on file  Tobacco Use  . Smoking status: Never Smoker  . Smokeless tobacco: Never Used  Substance and Sexual Activity  . Alcohol use: No  . Drug use: No  . Sexual activity: Not on file  Lifestyle  . Physical activity:    Days per week: Not on file    Minutes per session: Not on file  . Stress: Not on file  Relationships  . Social connections:    Talks on phone: Not on file    Gets together: Not on file    Attends religious service: Not on file    Active member of club or organization: Not on file    Attends meetings of clubs or organizations: Not on file    Relationship status: Not on file  . Intimate partner violence:    Fear of current or ex partner: Not on file    Emotionally abused: Not on file    Physically abused: Not on file    Forced sexual activity: Not on file  Other Topics Concern  . Not on file  Social History Narrative  . Not on file    Family History  Problem Relation Age of Onset  . Arthritis Mother   . Depression Mother   . Arthritis Father   . Stroke Sister     ROS: no fevers or chills, productive cough, hemoptysis, dysphasia, odynophagia, melena, hematochezia, dysuria, hematuria,  rash, seizure activity, orthopnea, PND, pedal edema, claudication. Remaining systems are negative.  Physical Exam: Well-developed well-nourished in no acute distress.  Skin is warm and dry.  HEENT is normal.  Neck is supple.  Chest is clear to auscultation with normal expansion.  Cardiovascular exam is regular rate and rhythm.  Abdominal exam nontender or distended. No masses palpated. Extremities show no edema. neuro grossly intact  ECG- personally reviewed  A/P  1  Olga Millers, MD

## 2018-10-25 ENCOUNTER — Other Ambulatory Visit (INDEPENDENT_AMBULATORY_CARE_PROVIDER_SITE_OTHER): Payer: Medicare Other

## 2018-10-25 DIAGNOSIS — E1122 Type 2 diabetes mellitus with diabetic chronic kidney disease: Secondary | ICD-10-CM

## 2018-10-25 DIAGNOSIS — N183 Chronic kidney disease, stage 3 unspecified: Secondary | ICD-10-CM

## 2018-10-25 LAB — POCT GLYCOSYLATED HEMOGLOBIN (HGB A1C): Hemoglobin A1C: 7 % — AB (ref 4.0–5.6)

## 2018-10-25 NOTE — Telephone Encounter (Signed)
Done

## 2018-10-25 NOTE — Progress Notes (Signed)
a1c

## 2018-10-27 ENCOUNTER — Ambulatory Visit: Payer: Medicare Other | Admitting: Cardiology

## 2018-11-01 ENCOUNTER — Ambulatory Visit: Payer: Medicare Other | Admitting: Physician Assistant

## 2018-11-23 ENCOUNTER — Encounter: Payer: Self-pay | Admitting: Physician Assistant

## 2018-11-23 ENCOUNTER — Ambulatory Visit (INDEPENDENT_AMBULATORY_CARE_PROVIDER_SITE_OTHER): Payer: Medicare Other | Admitting: Physician Assistant

## 2018-11-23 VITALS — BP 121/57 | HR 65

## 2018-11-23 DIAGNOSIS — Z23 Encounter for immunization: Secondary | ICD-10-CM | POA: Diagnosis not present

## 2018-11-23 DIAGNOSIS — I872 Venous insufficiency (chronic) (peripheral): Secondary | ICD-10-CM

## 2018-11-23 DIAGNOSIS — R238 Other skin changes: Secondary | ICD-10-CM

## 2018-11-23 DIAGNOSIS — L97929 Non-pressure chronic ulcer of unspecified part of left lower leg with unspecified severity: Secondary | ICD-10-CM

## 2018-11-23 DIAGNOSIS — N183 Chronic kidney disease, stage 3 (moderate): Secondary | ICD-10-CM | POA: Diagnosis not present

## 2018-11-23 DIAGNOSIS — E1122 Type 2 diabetes mellitus with diabetic chronic kidney disease: Secondary | ICD-10-CM

## 2018-11-23 MED ORDER — FREESTYLE LIBRE 14 DAY SENSOR MISC: 1.0000 | 11 refills | Status: AC

## 2018-11-23 MED ORDER — FREESTYLE LIBRE 14 DAY READER DEVI: 1.0000 | 11 refills | Status: AC

## 2018-11-23 NOTE — Progress Notes (Signed)
Subjective:    Patient ID: Bonnie Clark, female    DOB: 16-Aug-1941, 77 y.o.   MRN: [id number]  HPI Pt is a morbidly obese female with cardiomyopathy, T2DM, A.fib, bilateral lower extremity venous stasis and edema who presents to the clinic with worsening leg swelling, redness and blisters.   Pt historically is wrapped in unna boots every 2-4 weeks. She has gone almost 6 weeks since the last wrap. She was doing well until christmas she traveled a lot and were at other peoples home and her legs were down more and not elevated. She started noticing redness and a few blisters the day after christmas. Some have burst already. They are tender.   T2DM her last a1c was 7.0. she request freestyle libre to help with her testing her sugars.   .. Active Ambulatory Problems    Diagnosis Date Noted  . Depression 09/11/2011  . Arthritis   . Type 2 diabetes mellitus (HCC)   . Hyperlipidemia   . Hypertension   . Thyroid disease   . Morbidly obese (HCC) 09/11/2011  . Chronic insomnia 09/11/2011  . Chronic pain 09/11/2011  . Fibromyalgia 09/11/2011  . Anxiety and depression 09/11/2011  . Fatigue 09/11/2011  . Gout 09/11/2011  . Gait disorder 12/21/2011  . Atrial fibrillation 03/19/2012  . Cardiomyopathy (HCC) 04/21/2012  . Peripheral edema 06/21/2012  . OAB (overactive bladder) 06/21/2012  . Hypersomnolence 06/21/2012  . Other malaise and fatigue 02/14/2013  . Pre-ulcerative corn or callous 02/14/2013  . Osteoarthritis of both hips 10/11/2013  . Lumbar degenerative disc disease 10/11/2013  . Renal insufficiency 03/15/2014  . Venous stasis dermatitis of both lower extremities 04/03/2014  . Physical deconditioning 05/31/2014  . Ganglion cyst 03/12/2015  . Balance problems 03/06/2017  . Weakness of lower extremity 03/06/2017  . Cellulitis of lower extremity 05/13/2017  . Lower leg edema 05/13/2017  . Primary osteoarthritis of left shoulder 06/08/2017  . Mass of forearm, left 06/08/2017  .  Acute on chronic congestive heart failure (HCC) 08/06/2017  . CKD (chronic kidney disease) stage 3, GFR 30-59 ml/min (HCC) 08/07/2017  . Normocytic anemia 08/07/2017  . SOB (shortness of breath) 08/07/2017  . Low hemoglobin 08/11/2017  . Chronic pulmonary edema 08/13/2017  . Weakness of both lower extremities 10/17/2017  . Hypoglycemia 03/04/2018  . Shaky 03/04/2018  . Left cervical radiculopathy 07/15/2018  . Pulmonary edema cardiac cause (HCC) 08/09/2018   Resolved Ambulatory Problems    Diagnosis Date Noted  . Spinal stenosis 09/11/2011  . Sciatica of right side 09/11/2011  . Chest pain 03/05/2012  . Urinary incontinence 03/05/2012  . Abscess of heel, left 02/14/2013  . Osteoarthritis of right hip 01/30/2014  . Pressure ulcer stage II 03/12/2015  . Olecranon bursitis of left elbow 06/29/2015  . Venous stasis ulcer of thigh limited to breakdown of skin with varicose veins (HCC) 12/16/2016  . Decubitus ulcer of left buttock, stage 2 (HCC) 08/13/2017   Past Medical History:  Diagnosis Date  . Diabetes mellitus   . Gout 09/11/2011  . Obesity 09/11/2011  . Urine incontinence       Review of Systems See HPI.     Objective:   Physical Exam Vitals signs reviewed.  Constitutional:      Appearance: Normal appearance.     Comments: In wheelchair.   HENT:     Head: Normocephalic and atraumatic.  Cardiovascular:     Rate and Rhythm: Normal rate and regular rhythm.     Pulses: Normal pulses.  Heart sounds: Normal heart sounds.  Pulmonary:     Effort: Pulmonary effort is normal.     Breath sounds: Normal breath sounds.  Skin:    Comments: Bilateral legs non pitting edema with lipodermosclerosis and red to purple discoloration over anterior lower legs bilaterally. Not warm to touch. 1 large blister on left medial lower leg about 3cm by 2cm. And 1 large ulcer left lateral leg 1cm by 2cm limited to only skin breakdown.   Right leg: area of small clustered ulcers with some  minimal drainage.   Neurological:     General: No focal deficit present.     Mental Status: She is alert and oriented to person, place, and time.  Psychiatric:        Mood and Affect: Mood normal.        Behavior: Behavior normal.           Assessment & Plan:  [name] KitchenMarland KitchenAnnique was seen today for leg swelling.  Diagnoses and all orders for this visit:  Venous stasis dermatitis of both lower extremities  Blisters of multiple sites  Ulcer of left lower extremity, unspecified ulcer stage (HCC)  Need for pneumococcal vaccination -     Pneumococcal polysaccharide vaccine 23-valent greater than or equal to 2yo subcutaneous/IM  Type 2 diabetes mellitus with stage 3 chronic kidney disease, without long-term current use of insulin (HCC) -     Continuous Blood Gluc Sensor (FREESTYLE LIBRE 14 DAY SENSOR) MISC; 1 Device by Does not apply route every 14 (fourteen) days. -     Continuous Blood Gluc Receiver (FREESTYLE LIBRE 14 DAY READER) DEVI; 1 applicator by Does not apply route every 14 (fourteen) days.  I did not see a need for antibiotic.  Wrapped in bilateral unna boot today. If after 7 days when boot comes off her legs do not look better then come back in otherwise keep legs elevated and stay on diuretic and follow up in 1-3 weeks. AT HOME try to keep wrapped in ace wrap for some compression since compression stockings do not work for you.   Freestyle libre ordered.  Pneumonia shot given today.  Reminded of need for eye exam.  Pt declined flu shot.   .. Diabetic Foot Exam - Simple   Simple Foot Form Diabetic Foot exam was performed with the following findings:  Yes 11/23/2018 10:58 AM  Visual Inspection Sensation Testing Pulse Check Comments

## 2018-11-25 ENCOUNTER — Encounter: Payer: Self-pay | Admitting: Physician Assistant

## 2018-12-03 ENCOUNTER — Other Ambulatory Visit: Payer: Self-pay | Admitting: Physician Assistant

## 2018-12-03 DIAGNOSIS — E1122 Type 2 diabetes mellitus with diabetic chronic kidney disease: Secondary | ICD-10-CM

## 2018-12-03 DIAGNOSIS — N183 Chronic kidney disease, stage 3 (moderate): Principal | ICD-10-CM

## 2018-12-16 ENCOUNTER — Encounter: Payer: Self-pay | Admitting: Physician Assistant

## 2018-12-16 ENCOUNTER — Ambulatory Visit (INDEPENDENT_AMBULATORY_CARE_PROVIDER_SITE_OTHER): Payer: Medicare Other | Admitting: Physician Assistant

## 2018-12-16 VITALS — BP 130/76 | HR 83 | Temp 97.8°F | Ht 67.02 in

## 2018-12-16 DIAGNOSIS — I872 Venous insufficiency (chronic) (peripheral): Secondary | ICD-10-CM | POA: Diagnosis not present

## 2018-12-16 DIAGNOSIS — L98491 Non-pressure chronic ulcer of skin of other sites limited to breakdown of skin: Secondary | ICD-10-CM | POA: Diagnosis not present

## 2018-12-16 DIAGNOSIS — M19012 Primary osteoarthritis, left shoulder: Secondary | ICD-10-CM

## 2018-12-16 MED ORDER — HYDROCODONE-ACETAMINOPHEN 5-325 MG PO TABS: 1.0000 | ORAL_TABLET | Freq: Three times a day (TID) | ORAL | 0 refills | Status: DC | PRN

## 2018-12-16 NOTE — Progress Notes (Signed)
Subjective:    Patient ID: Bonnie Clark, female    DOB: [birth year], 78 y.o.   MRN: [id number]  HPI Pt is a morbidly obese, non ambulatory 78 yo female with chronic venous stasis, lower extremity edema, superficial ulcer. She wore unna boots for 8 days. Looked good but did not come back in for re-wrap. Has been 3 weeks since last unna boot.   Request as needed norco for pain. At times shoulder and legs are unbearable.   .. Active Ambulatory Problems    Diagnosis Date Noted  . Depression 09/11/2011  . Arthritis   . Type 2 diabetes mellitus (HCC)   . Hyperlipidemia   . Hypertension   . Thyroid disease   . Morbidly obese (HCC) 09/11/2011  . Chronic insomnia 09/11/2011  . Chronic pain 09/11/2011  . Fibromyalgia 09/11/2011  . Anxiety and depression 09/11/2011  . Fatigue 09/11/2011  . Gout 09/11/2011  . Gait disorder 12/21/2011  . Atrial fibrillation 03/19/2012  . Cardiomyopathy (HCC) 04/21/2012  . Peripheral edema 06/21/2012  . OAB (overactive bladder) 06/21/2012  . Hypersomnolence 06/21/2012  . Other malaise and fatigue 02/14/2013  . Pre-ulcerative corn or callous 02/14/2013  . Osteoarthritis of both hips 10/11/2013  . Lumbar degenerative disc disease 10/11/2013  . Renal insufficiency 03/15/2014  . Venous stasis dermatitis of both lower extremities 04/03/2014  . Physical deconditioning 05/31/2014  . Ganglion cyst 03/12/2015  . Balance problems 03/06/2017  . Weakness of lower extremity 03/06/2017  . Cellulitis of lower extremity 05/13/2017  . Lower leg edema 05/13/2017  . Primary osteoarthritis of left shoulder 06/08/2017  . Mass of forearm, left 06/08/2017  . Acute on chronic congestive heart failure (HCC) 08/06/2017  . CKD (chronic kidney disease) stage 3, GFR 30-59 ml/min (HCC) 08/07/2017  . Normocytic anemia 08/07/2017  . SOB (shortness of breath) 08/07/2017  . Low hemoglobin 08/11/2017  . Chronic pulmonary edema 08/13/2017  . Weakness of both lower extremities  10/17/2017  . Hypoglycemia 03/04/2018  . Shaky 03/04/2018  . Left cervical radiculopathy 07/15/2018  . Pulmonary edema cardiac cause (HCC) 08/09/2018  . Superficial ulcer of skin (HCC) 12/16/2018   Resolved Ambulatory Problems    Diagnosis Date Noted  . Spinal stenosis 09/11/2011  . Sciatica of right side 09/11/2011  . Chest pain 03/05/2012  . Urinary incontinence 03/05/2012  . Abscess of heel, left 02/14/2013  . Osteoarthritis of right hip 01/30/2014  . Pressure ulcer stage II 03/12/2015  . Olecranon bursitis of left elbow 06/29/2015  . Venous stasis ulcer of thigh limited to breakdown of skin with varicose veins (HCC) 12/16/2016  . Decubitus ulcer of left buttock, stage 2 (HCC) 08/13/2017   Past Medical History:  Diagnosis Date  . Diabetes mellitus   . Gout 09/11/2011  . Obesity 09/11/2011  . Urine incontinence       Review of Systems    see HPI.  Objective:   Physical Exam Vitals signs reviewed.  Constitutional:      Appearance: Normal appearance. She is obese. She is not ill-appearing.  HENT:     Head: Normocephalic and atraumatic.  Cardiovascular:     Rate and Rhythm: Normal rate.  Pulmonary:     Effort: Pulmonary effort is normal.  Skin:    Comments: Bilateral sclerosed lymphedema. Weeping blisters. Erythematous lower extremity bilateral.  One 3cm by 2cm superficial ulcer of right lateral lower leg.   Neurological:     General: No focal deficit present.     Mental Status: She is alert  and oriented to person, place, and time.           Assessment & Plan:  [name] KitchenMarland KitchenNakyiah was seen today for follow-up.  Diagnoses and all orders for this visit:  Venous stasis dermatitis of both lower extremities  Primary osteoarthritis of left shoulder -     HYDROcodone-acetaminophen (NORCO/VICODIN) 5-325 MG tablet; Take 1 tablet by mouth every 8 (eight) hours as needed for moderate pain or severe pain.  Superficial ulcer of skin (HCC)   Ongoing problem. Reassured did  not see any signs of infection. Did some debridement of ulcer. Wrapped in [employer]. Follow up in 1 week.   Acute supply for moderate to severe pain as needed.

## 2018-12-20 ENCOUNTER — Encounter: Payer: Self-pay | Admitting: Physician Assistant

## 2018-12-24 ENCOUNTER — Ambulatory Visit: Payer: Medicare Other | Admitting: Physician Assistant

## 2018-12-27 ENCOUNTER — Encounter: Payer: Self-pay | Admitting: Physician Assistant

## 2018-12-27 ENCOUNTER — Ambulatory Visit (INDEPENDENT_AMBULATORY_CARE_PROVIDER_SITE_OTHER): Payer: Medicare Other | Admitting: Physician Assistant

## 2018-12-27 VITALS — BP 114/47 | HR 103

## 2018-12-27 DIAGNOSIS — L98491 Non-pressure chronic ulcer of skin of other sites limited to breakdown of skin: Secondary | ICD-10-CM

## 2018-12-27 DIAGNOSIS — R2689 Other abnormalities of gait and mobility: Secondary | ICD-10-CM

## 2018-12-27 DIAGNOSIS — M5136 Other intervertebral disc degeneration, lumbar region: Secondary | ICD-10-CM | POA: Diagnosis not present

## 2018-12-27 DIAGNOSIS — R29898 Other symptoms and signs involving the musculoskeletal system: Secondary | ICD-10-CM

## 2018-12-27 DIAGNOSIS — I872 Venous insufficiency (chronic) (peripheral): Secondary | ICD-10-CM | POA: Diagnosis not present

## 2018-12-27 DIAGNOSIS — M51369 Other intervertebral disc degeneration, lumbar region without mention of lumbar back pain or lower extremity pain: Secondary | ICD-10-CM

## 2018-12-27 MED ORDER — AMBULATORY NON FORMULARY MEDICATION: 0 refills | Status: AC

## 2018-12-27 NOTE — Progress Notes (Deleted)
   Subjective:    Patient ID: Bonnie Clark, female    DOB: 27-Feb-1941, 78 y.o.   MRN: [id number]  HPI  [place].  Wheelchair 8years. Does the patient require and use a wheelchair to move around in the home?  {YES/NO/NOT APPLICABLE:20182}  Does the patient have quadriplegia or a fixed hip angle?  {YES/NO/NOT APPLICABLE:20182}  Does the patient have a trunk or brace cast or other brace that requires a reclining back feature for positioning? {YES/NO/NOT APPLICABLE:20182}  Does the patient have excessive extensor tone of the trunk muscles?  {YES/NO/NOT APPLICABLE:20182}  Does the patient need to rest in a recumbent position two or more times a day? {YES/NO/NOT APPLICABLE:20182}  The patient has the following condition(s) that prevent a 90 degree flexion of the knee: {WHEELCHAIR LEG REST:20183}  Does the patient have a need for arm height different than available using non-adjustable arms? {YES/NO/NOT APPLICABLE:20182}  How many hours per day does the patient usually spend in the  wheelchair? ( round up to the next hour )  ***.  Is the patient able to adequately self-propel in the standard weight wheelchair?  {YES/NO/NOT APPLICABLE:20182}  If not, would the patient be able to adequately self propel in the  wheelchair being ordered?  {YES/NO/NOT APPLICABLE:20182}  The answers to the above questions were provided by:  {DME QUESTIONS TAVWPV:[id number]}   Review of Systems     Objective:   Physical Exam        Assessment & Plan:

## 2018-12-27 NOTE — Progress Notes (Signed)
Subjective:    Patient ID: Bonnie Clark, female    DOB: 05-26-1941, 78 y.o.   MRN: [id number]  HPI: This is a 78 yo morbidly obese female who presents to the clinic today for chronic venous stasis ulcers of bilateral lower extremities. Patient was last seen on 1/23 and states she wore her unna boots for 1 week and then removed them without rewrapping.Denies fever, chills, or signs of infection of lower legs such as itching or increased pain.   She is having a lot of problems with her power wheelchair. She has had for 8 years but struggling to go up ramps etc. She is wondering about replacing it.   Does the patient require and use a wheelchair to move around in the home?  yes  Does the patient have quadriplegia or a fixed hip angle?  no  Does the patient have a trunk or brace cast or other brace that requires a reclining back feature for positioning? no  Does the patient have excessive extensor tone of the trunk muscles?  no  Does the patient need to rest in a recumbent position two or more times a day? yes  The patient has the following condition(s) that prevent a 90 degree flexion of the knee: Significant edema of the lower extremities  Does the patient have a need for arm height different than available using non-adjustable arms? no  How many hours per day does the patient usually spend in the  wheelchair? ( round up to the next hour )  10-12 hours.  Is the patient able to adequately self-propel in the standard weight wheelchair?  no  If not, would the patient be able to adequately self propel in the  wheelchair being ordered?  yes  The answers to the above questions were provided by:  Patient    Review of Systems  Constitutional: Negative for chills and fever.       Objective:   Physical Exam Constitutional:      Appearance: She is obese.  HENT:     Head: Normocephalic and atraumatic.  Cardiovascular:     Rate and Rhythm: Normal rate.     Pulses: Normal  pulses.     Heart sounds: Normal heart sounds.  Pulmonary:     Effort: Pulmonary effort is normal.     Breath sounds: Normal breath sounds.  Skin:    Comments: Bilateral sclerosed lymphedema. Weeping blisters. Erythematous lower extremity bilateral.  One 3cm by 2cm superficial ulcer of right lateral lower leg and one 2cm by 2cm superficial ulcer of left lateral lower leg.   Psychiatric:        Mood and Affect: Mood normal.        Thought Content: Thought content normal.           Assessment & Plan:  [name] KitchenMarland KitchenLavita was seen today for follow-up.  Diagnoses and all orders for this visit:  Balance problems -     AMBULATORY NON FORMULARY MEDICATION; Need for one power wheelchair due to balance instability, lower extremity weakness, and lumbar DDD.  DX: R26.89, R29.898, M51.36  Weakness of lower extremity, unspecified laterality -     AMBULATORY NON FORMULARY MEDICATION; Need for one power wheelchair due to balance instability, lower extremity weakness, and lumbar DDD.  DX: R26.89, R29.898, M51.36  Lumbar degenerative disc disease -     AMBULATORY NON FORMULARY MEDICATION; Need for one power wheelchair due to balance instability, lower extremity weakness, and lumbar DDD.  DX: R26.89, R29.898, M51.36  Morbid obesity (HCC) -     AMBULATORY NON FORMULARY MEDICATION; Need for one power wheelchair due to balance instability, lower extremity weakness, and lumbar DDD.  DX: R26.89, R29.898, M51.36  Superficial ulcer of skin (HCC)  Venous stasis dermatitis of both lower extremities   Ongoing problem. Reassured did not see any signs of infection. Placed hydrofera over bilateral ulcers and wrapped in unna boot, ace wrap. Patient given additional hydrofera samples to reapply in minimum of 4 days. Patient declines home wound care as well as in office wound care clinic. Follow up in 1 week.   I do think patient would benefit from a new power wheelchair. I will fax new order and note from today  to see if we can get approved.   [name] Kitchen.Spent 30 minutes with patient and greater than 50 percent of visit spent counseling patient regarding treatment plan.  [name] KitchenHarlon Flor PA-C, have reviewed and agree with the above documentation in it's entirety.

## 2019-01-05 NOTE — Progress Notes (Signed)
HPI: FU CHF and atrial fibrillation. Patient had a Myoview on 03/17/2012 for risk stratification. There was no ischemia. However the patient was noted to be in atrial fibrillation which was a new diagnosis. Decision made for rate control and anticoagulation. Holter 8/15 showed a fib rate controlled. ABIs 9/15 normal; venous dopplers negative.Echocardiogram September 2017 was technically difficult. LV function felt to be normal. Mild left atrial enlargement and mild tricuspid regurgitation.Since last seen, patient denies dyspnea, chest pain, palpitations or syncope.  She has chronic pedal edema unchanged.  Current Outpatient Medications  Medication Sig Dispense Refill  . allopurinol (ZYLOPRIM) 300 MG tablet TAKE 1 TABLET BY MOUTH ONCE DAILY. 90 tablet 3  . AMBULATORY NON FORMULARY MEDICATION Knee-high, medium compression, graduated compression stockings. Apply to lower extremities. Zippered Compression Stockings, large circ, long length/knee high 1 each 0  . AMBULATORY NON FORMULARY MEDICATION Need for one power wheelchair due to balance instability, lower extremity weakness, and lumbar DDD.  DX: R26.89, R29.898, M51.36 1 Device 0  . AMBULATORY NON FORMULARY MEDICATION Need for one power wheelchair due to balance instability, lower extremity weakness, and lumbar DDD.  DX: R26.89, R29.898, M51.36 1 Device 0  . apixaban (ELIQUIS) 5 MG TABS tablet Take 1 tablet (5 mg total) by mouth 2 (two) times daily. 60 tablet 6  . atorvastatin (LIPITOR) 40 MG tablet TAKE 1 TABLET BY MOUTH ONCE DAILY AT 6 PM. 90 tablet 1  . Bismuth Tribromoph-Petrolatum (XEROFORM PETROLAT PATCH 4"X4") PADS Apply to irritated skin daily, replace daily as needed. 30 each 1  . clobetasol ointment (TEMOVATE) 0.05 % Apply 1 application topically 2 (two) times daily. (Patient taking differently: Apply 1 application topically as directed. ) 45 g 3  . clonazePAM (KLONOPIN) 1 MG tablet TAKE ONE-HALF TO 1 TABLET BY MOUTH TWICE  DAILY AS NEEDED FOR ANXIETY 30 tablet 0  . Continuous Blood Gluc Receiver (FREESTYLE LIBRE 14 DAY READER) DEVI 1 applicator by Does not apply route every 14 (fourteen) days. 2 Device 11  . Continuous Blood Gluc Sensor (FREESTYLE LIBRE 14 DAY SENSOR) MISC 1 Device by Does not apply route every 14 (fourteen) days. 2 each 11  . diclofenac sodium (VOLTAREN) 1 % GEL Apply 4 g topically 4 (four) times daily. 1 Tube 5  . doxycycline (VIBRA-TABS) 100 MG tablet Take 1 tablet (100 mg total) by mouth 2 (two) times daily. 20 tablet 0  . Dulaglutide (TRULICITY) 0.75 MG/0.5ML SOPN INJECT 1  SUBCUTANEOUSLY ONCE A WEEK 12 pen 1  . DULoxetine (CYMBALTA) 60 MG capsule Take 2 capsules (120 mg total) by mouth daily. 60 capsule 3  . glimepiride (AMARYL) 4 MG tablet Take 1 tablet (4 mg total) by mouth 2 (two) times daily with a meal. 180 tablet 0  . glucose blood (ONE TOUCH ULTRA TEST) test strip Use to test blood sugar 2 times daily as instructed. Dx code: E11.9 300 each 3  . HYDROcodone-acetaminophen (NORCO) 7.5-325 MG tablet Take 1 tablet by mouth every 6 (six) hours as needed for moderate pain (cough). 20 tablet 0  . lisinopril (PRINIVIL,ZESTRIL) 5 MG tablet TAKE 1 TABLET BY MOUTH ONCE DAILY. 90 tablet 3  . metoprolol tartrate (LOPRESSOR) 100 MG tablet Take 1 tablet (100 mg total) by mouth 2 (two) times daily. 180 tablet 1  . pregabalin (LYRICA) 200 MG capsule Take 1 capsule (200 mg total) by mouth 3 (three) times daily. 90 capsule 3  . SitaGLIPtin-MetFORMIN HCl (JANUMET XR) [phone] MG TB24 Take 1 tablet  by mouth daily. 30 tablet 2  . tiZANidine (ZANAFLEX) 4 MG tablet Take one tablet as needed for every 6-8 hours for muscle spasms and pain. 90 tablet 1  . torsemide (DEMADEX) 20 MG tablet Take 1 tablet (20 mg total) by mouth daily. 90 tablet 3  . traZODone (DESYREL) 50 MG tablet Take 0.5-1 tablets (25-50 mg total) by mouth at bedtime as needed for sleep. 30 tablet 0   No current facility-administered medications for  this visit.      Past Medical History:  Diagnosis Date  . Anxiety and depression 09/11/2011  . Arthritis   . Atrial fibrillation (HCC)   . Chronic insomnia 09/11/2011  . CKD (chronic kidney disease) stage 3, GFR 30-59 ml/min (HCC) 08/07/2017  . Diabetes mellitus   . Fibromyalgia 09/11/2011  . Gait disorder 12/21/2011  . Gout 09/11/2011  . Hyperlipidemia   . Hypertension   . Normocytic anemia 08/07/2017  . OAB (overactive bladder) 06/21/2012  . Obesity 09/11/2011  . Peripheral edema 06/21/2012  . Sciatica of right side 09/11/2011  . Spinal stenosis 09/11/2011  . Thyroid disease   . Urine incontinence     Past Surgical History:  Procedure Laterality Date  . REPLACEMENT TOTAL KNEE BILATERAL     right approx 1994, left approx 1999  . SHOULDER SURGERY     right - approx 2005  . THYROIDECTOMY, PARTIAL     at 78yo    Social History   Socioeconomic History  . Marital status: Married    Spouse name: Not on file  . Number of children: 3  . Years of education: Not on file  . Highest education level: Not on file  Occupational History  . Not on file  Social Needs  . Financial resource strain: Not on file  . Food insecurity:    Worry: Not on file    Inability: Not on file  . Transportation needs:    Medical: Not on file    Non-medical: Not on file  Tobacco Use  . Smoking status: Never Smoker  . Smokeless tobacco: Never Used  Substance and Sexual Activity  . Alcohol use: No  . Drug use: No  . Sexual activity: Not on file  Lifestyle  . Physical activity:    Days per week: Not on file    Minutes per session: Not on file  . Stress: Not on file  Relationships  . Social connections:    Talks on phone: Not on file    Gets together: Not on file    Attends religious service: Not on file    Active member of club or organization: Not on file    Attends meetings of clubs or organizations: Not on file    Relationship status: Not on file  . Intimate partner violence:     Fear of current or ex partner: Not on file    Emotionally abused: Not on file    Physically abused: Not on file    Forced sexual activity: Not on file  Other Topics Concern  . Not on file  Social History Narrative  . Not on file    Family History  Problem Relation Age of Onset  . Arthritis Mother   . Depression Mother   . Arthritis Father   . Stroke Sister     ROS: no fevers or chills, productive cough, hemoptysis, dysphasia, odynophagia, melena, hematochezia, dysuria, hematuria, rash, seizure activity, orthopnea, PND,  claudication. Remaining systems are negative.  Physical Exam: Well-developed obese in  no acute distress.  Skin is warm and dry.  HEENT is normal.  Neck is supple.  Chest is clear to auscultation with normal expansion.  Cardiovascular exam is irregular Abdominal exam nontender or distended. No masses palpated. Extremities show 1+ edema and chronic skin changes with some erythema. neuro grossly intact  ECG-atrial fibrillation at a rate of 88, no ST changes.  Personally reviewed  A/P  1 permanent atrial fibrillation-plan to continue beta-blocker for rate control.  Continue apixaban.  Check hemoglobin and renal function.  2 hypertension-patient's blood pressure is controlled today.  Continue present medications and follow.  3 hyperlipidemia-continue statin.  4 peripheral edema-plan to continue Demadex at present dose.  Continue fluid restriction and low-sodium diet.  Check potassium and renal function.  Likely secondary to right heart failure and venous insufficiency.  Leg ulcers are being treated by primary care.  5 morbid obesity-needs weight loss.  6 probable sleep apnea-I have continuously encouraged her to be evaluated by pulmonary and if sleep apnea present explained the benefit of CPAP.  However she has not followed up.  Olga Millers, MD

## 2019-01-07 ENCOUNTER — Ambulatory Visit: Payer: Medicare Other | Admitting: Physician Assistant

## 2019-01-12 ENCOUNTER — Ambulatory Visit: Payer: Medicare Other | Admitting: Physician Assistant

## 2019-01-14 ENCOUNTER — Encounter: Payer: Self-pay | Admitting: Physician Assistant

## 2019-01-14 ENCOUNTER — Ambulatory Visit (INDEPENDENT_AMBULATORY_CARE_PROVIDER_SITE_OTHER): Payer: Medicare Other | Admitting: Physician Assistant

## 2019-01-14 VITALS — BP 104/64 | HR 67

## 2019-01-14 DIAGNOSIS — I4821 Permanent atrial fibrillation: Secondary | ICD-10-CM | POA: Diagnosis not present

## 2019-01-14 DIAGNOSIS — E1122 Type 2 diabetes mellitus with diabetic chronic kidney disease: Secondary | ICD-10-CM

## 2019-01-14 DIAGNOSIS — N183 Chronic kidney disease, stage 3 (moderate): Secondary | ICD-10-CM

## 2019-01-14 DIAGNOSIS — L03119 Cellulitis of unspecified part of limb: Secondary | ICD-10-CM | POA: Diagnosis not present

## 2019-01-14 DIAGNOSIS — L98491 Non-pressure chronic ulcer of skin of other sites limited to breakdown of skin: Secondary | ICD-10-CM

## 2019-01-14 DIAGNOSIS — I872 Venous insufficiency (chronic) (peripheral): Secondary | ICD-10-CM

## 2019-01-14 MED ORDER — DOXYCYCLINE HYCLATE 100 MG PO TABS: 100.0000 mg | ORAL_TABLET | Freq: Two times a day (BID) | ORAL | 0 refills | Status: DC

## 2019-01-14 MED ORDER — HYDROCODONE-ACETAMINOPHEN 7.5-325 MG PO TABS: 1.0000 | ORAL_TABLET | Freq: Four times a day (QID) | ORAL | 0 refills | Status: AC | PRN

## 2019-01-14 MED ORDER — METOPROLOL TARTRATE 100 MG PO TABS: 100.0000 mg | ORAL_TABLET | Freq: Two times a day (BID) | ORAL | 1 refills | Status: AC

## 2019-01-14 MED ORDER — GLIMEPIRIDE 4 MG PO TABS: 4.0000 mg | ORAL_TABLET | Freq: Two times a day (BID) | ORAL | 0 refills | Status: DC

## 2019-01-14 NOTE — Progress Notes (Signed)
Subjective:    Patient ID: Bonnie Clark, female    DOB: 06/07/1941, 78 y.o.   MRN: [id number]  HPI Pt is a 78 yo female with T2DM, cardiomyopathy, a fib, chronic venous stasis who presents to the clinic for 1 week follow-up on bilateral leg ulcers.  Due to scheduling areas it is actually been 2 weeks since her last leg wrap.  Last leg wrap we tried a new technique with some Hydrofera Blue and Unna boot wrapped around it.  She said by the night after they were wrapped her legs were burning.  She took off the [employer] and the ulcers were more irritated and larger.  Her legs are very painful.  She is gone through 12 Norco due to the pain.  They continue to drain.  She has done nothing else to help keep them clean.  Pt c/o very painful legs and a refill on norco.   .. Active Ambulatory Problems    Diagnosis Date Noted  . Depression 09/11/2011  . Arthritis   . Type 2 diabetes mellitus (HCC)   . Hyperlipidemia   . Hypertension   . Thyroid disease   . Morbidly obese (HCC) 09/11/2011  . Chronic insomnia 09/11/2011  . Chronic pain 09/11/2011  . Fibromyalgia 09/11/2011  . Anxiety and depression 09/11/2011  . Fatigue 09/11/2011  . Gout 09/11/2011  . Gait disorder 12/21/2011  . Atrial fibrillation 03/19/2012  . Cardiomyopathy (HCC) 04/21/2012  . Peripheral edema 06/21/2012  . OAB (overactive bladder) 06/21/2012  . Hypersomnolence 06/21/2012  . Other malaise and fatigue 02/14/2013  . Pre-ulcerative corn or callous 02/14/2013  . Osteoarthritis of both hips 10/11/2013  . Lumbar degenerative disc disease 10/11/2013  . Renal insufficiency 03/15/2014  . Venous stasis dermatitis of both lower extremities 04/03/2014  . Physical deconditioning 05/31/2014  . Ganglion cyst 03/12/2015  . Balance problems 03/06/2017  . Weakness of lower extremity 03/06/2017  . Cellulitis of lower extremity 05/13/2017  . Lower leg edema 05/13/2017  . Primary osteoarthritis of left shoulder 06/08/2017  . Mass of  forearm, left 06/08/2017  . Acute on chronic congestive heart failure (HCC) 08/06/2017  . CKD (chronic kidney disease) stage 3, GFR 30-59 ml/min (HCC) 08/07/2017  . Normocytic anemia 08/07/2017  . SOB (shortness of breath) 08/07/2017  . Low hemoglobin 08/11/2017  . Chronic pulmonary edema 08/13/2017  . Weakness of both lower extremities 10/17/2017  . Hypoglycemia 03/04/2018  . Shaky 03/04/2018  . Left cervical radiculopathy 07/15/2018  . Pulmonary edema cardiac cause (HCC) 08/09/2018  . Superficial ulcer of skin (HCC) 12/16/2018   Resolved Ambulatory Problems    Diagnosis Date Noted  . Spinal stenosis 09/11/2011  . Sciatica of right side 09/11/2011  . Chest pain 03/05/2012  . Urinary incontinence 03/05/2012  . Abscess of heel, left 02/14/2013  . Osteoarthritis of right hip 01/30/2014  . Pressure ulcer stage II 03/12/2015  . Olecranon bursitis of left elbow 06/29/2015  . Venous stasis ulcer of thigh limited to breakdown of skin with varicose veins (HCC) 12/16/2016  . Decubitus ulcer of left buttock, stage 2 (HCC) 08/13/2017   Past Medical History:  Diagnosis Date  . Diabetes mellitus   . Gout 09/11/2011  . Obesity 09/11/2011  . Urine incontinence     Review of Systems  All other systems reviewed and are negative.      Objective:   Physical Exam Vitals signs reviewed.  Constitutional:      Appearance: She is obese.  HENT:  Head: Normocephalic.  Cardiovascular:     Rate and Rhythm: Normal rate.     Pulses: Normal pulses.  Pulmonary:     Effort: Pulmonary effort is normal.  Skin:    Comments: Bilateral sclerosed legs with erythema and purulent drainage.  1 ulcer on the lateral right calf 3cm by 2cm.  1 ulcer of the medial left calf 3cm by 2cm. Tender to palpation.   Neurological:     General: No focal deficit present.     Mental Status: She is oriented to person, place, and time.           Assessment & Plan:  [name] KitchenMarland KitchenBernetha was seen today for  follow-up.  Diagnoses and all orders for this visit:  Chronic venous stasis dermatitis of both lower extremities  Type 2 diabetes mellitus with stage 3 chronic kidney disease, without long-term current use of insulin (HCC) -     glimepiride (AMARYL) 4 MG tablet; Take 1 tablet (4 mg total) by mouth 2 (two) times daily with a meal.  Permanent atrial fibrillation -     metoprolol tartrate (LOPRESSOR) 100 MG tablet; Take 1 tablet (100 mg total) by mouth 2 (two) times daily.  Superficial ulcer of skin (HCC) -     doxycycline (VIBRA-TABS) 100 MG tablet; Take 1 tablet (100 mg total) by mouth 2 (two) times daily. -     HYDROcodone-acetaminophen (NORCO) 7.5-325 MG tablet; Take 1 tablet by mouth every 6 (six) hours as needed for moderate pain (cough).  Venous stasis dermatitis of both lower extremities -     doxycycline (VIBRA-TABS) 100 MG tablet; Take 1 tablet (100 mg total) by mouth 2 (two) times daily.  Morbidly obese (HCC)  Cellulitis of lower extremity, unspecified laterality -     doxycycline (VIBRA-TABS) 100 MG tablet; Take 1 tablet (100 mg total) by mouth 2 (two) times daily.   Ulcers seemed to have increased in size, very purulent, erythema and swelling. Sent doxycycline for infection. Patient's legs were debrided with Hibiclens. Wrapped in unna boots today.  Continue on diuretic.  Follow up in 1 week.   12/16/2018 last refill of norco #15.  [name] KitchenMarland KitchenPDMP reviewed during this encounter. Discussed use sparingly.

## 2019-01-14 NOTE — Patient Instructions (Addendum)
Start doxycycline.  Follow up in 1 week.   Address: [address] [place], [place], [place] [address]  Phone: [phone]  Hours: Open  Closes 6:30 PM

## 2019-01-19 ENCOUNTER — Ambulatory Visit (INDEPENDENT_AMBULATORY_CARE_PROVIDER_SITE_OTHER): Payer: Medicare Other | Admitting: Cardiology

## 2019-01-19 ENCOUNTER — Encounter: Payer: Self-pay | Admitting: Cardiology

## 2019-01-19 ENCOUNTER — Ambulatory Visit: Payer: Medicare Other | Admitting: Physician Assistant

## 2019-01-19 VITALS — BP 131/69 | HR 88 | Ht 67.02 in | Wt 330.0 lb

## 2019-01-19 DIAGNOSIS — E7800 Pure hypercholesterolemia, unspecified: Secondary | ICD-10-CM

## 2019-01-19 DIAGNOSIS — I4891 Unspecified atrial fibrillation: Secondary | ICD-10-CM | POA: Diagnosis not present

## 2019-01-19 DIAGNOSIS — I10 Essential (primary) hypertension: Secondary | ICD-10-CM | POA: Diagnosis not present

## 2019-01-19 NOTE — Patient Instructions (Signed)
Medication Instructions:   NO CHANGE  Labwork:  Your physician recommends that you HAVE LAB WORK TODAY  Follow-Up:  Your physician wants you to follow-up in: 6 MONTHS WITH DR CRENSHAW You will receive a reminder letter in the mail two months in advance. If you don't receive a letter, please call our office to schedule the follow-up appointment.  CALL IN June TO SCHEDULE APPOINTMENT IN AUGUST   

## 2019-01-20 DIAGNOSIS — I4891 Unspecified atrial fibrillation: Secondary | ICD-10-CM | POA: Diagnosis not present

## 2019-01-21 ENCOUNTER — Encounter: Payer: Self-pay | Admitting: *Deleted

## 2019-01-21 LAB — BASIC METABOLIC PANEL
BUN / CREAT RATIO: 25 (calc) — AB (ref 6–22)
BUN: 28 mg/dL — ABNORMAL HIGH (ref 7–25)
CHLORIDE: 103 mmol/L (ref 98–110)
CO2: 26 mmol/L (ref 20–32)
Calcium: 9.6 mg/dL (ref 8.6–10.4)
Creat: 1.13 mg/dL — ABNORMAL HIGH (ref 0.60–0.93)
Glucose, Bld: 136 mg/dL — ABNORMAL HIGH (ref 65–99)
Potassium: 5.3 mmol/L (ref 3.5–5.3)
Sodium: 139 mmol/L (ref 135–146)

## 2019-01-21 LAB — CBC
HCT: 31.1 % — ABNORMAL LOW (ref 35.0–45.0)
HEMOGLOBIN: 10 g/dL — AB (ref 11.7–15.5)
MCH: 30 pg (ref 27.0–33.0)
MCHC: 32.2 g/dL (ref 32.0–36.0)
MCV: 93.4 fL (ref 80.0–100.0)
MPV: 10.7 fL (ref 7.5–12.5)
Platelets: 233 10*3/uL (ref 140–400)
RBC: 3.33 10*6/uL — ABNORMAL LOW (ref 3.80–5.10)
RDW: 15.6 % — ABNORMAL HIGH (ref 11.0–15.0)
WBC: 6.2 10*3/uL (ref 3.8–10.8)

## 2019-01-24 ENCOUNTER — Other Ambulatory Visit: Payer: Self-pay | Admitting: Physician Assistant

## 2019-01-24 MED ORDER — FERROUS SULFATE 325 (65 FE) MG PO TBEC: 325.0000 mg | DELAYED_RELEASE_TABLET | Freq: Every day | ORAL | 5 refills | Status: AC

## 2019-01-24 NOTE — Progress Notes (Signed)
Call pt: confirm she is not having any black tarry stools or bright red blood in stools. She is mildly anemic. I am going to add ferrous sulfate once a day. Recheck in 4 weeks.

## 2019-01-25 ENCOUNTER — Ambulatory Visit (INDEPENDENT_AMBULATORY_CARE_PROVIDER_SITE_OTHER): Payer: Medicare Other | Admitting: Physician Assistant

## 2019-01-25 ENCOUNTER — Encounter: Payer: Self-pay | Admitting: Physician Assistant

## 2019-01-25 VITALS — BP 114/63 | HR 55 | Temp 97.9°F | Wt 330.0 lb

## 2019-01-25 DIAGNOSIS — I872 Venous insufficiency (chronic) (peripheral): Secondary | ICD-10-CM

## 2019-01-25 DIAGNOSIS — L98491 Non-pressure chronic ulcer of skin of other sites limited to breakdown of skin: Secondary | ICD-10-CM

## 2019-01-25 DIAGNOSIS — D508 Other iron deficiency anemias: Secondary | ICD-10-CM | POA: Diagnosis not present

## 2019-01-25 NOTE — Progress Notes (Signed)
Subjective:    Patient ID: Bonnie Clark, female    DOB: [birth year], 78 y.o.   MRN: [id number]  HPI Pt is a 78 yo morbidly obese female with ongoing chronic venous stasis and superficial ulcer. It has been 2 weeks since last unna boot wrap. She has been out of the wrap for 1 week. She has noticed a lot of improvement. The ulcer on the right lower leg seems to be decreasing in size. Continues to drain. Left left just a scant small ulcer with no draining. No fever, chills, body aches. Almost done with doxycycline.   .. Active Ambulatory Problems    Diagnosis Date Noted  . Depression 09/11/2011  . Arthritis   . Type 2 diabetes mellitus (HCC)   . Hyperlipidemia   . Hypertension   . Thyroid disease   . Morbidly obese (HCC) 09/11/2011  . Chronic insomnia 09/11/2011  . Chronic pain 09/11/2011  . Fibromyalgia 09/11/2011  . Anxiety and depression 09/11/2011  . Fatigue 09/11/2011  . Gout 09/11/2011  . Gait disorder 12/21/2011  . Atrial fibrillation 03/19/2012  . Cardiomyopathy (HCC) 04/21/2012  . Peripheral edema 06/21/2012  . OAB (overactive bladder) 06/21/2012  . Hypersomnolence 06/21/2012  . Other malaise and fatigue 02/14/2013  . Pre-ulcerative corn or callous 02/14/2013  . Osteoarthritis of both hips 10/11/2013  . Lumbar degenerative disc disease 10/11/2013  . Renal insufficiency 03/15/2014  . Venous stasis dermatitis of both lower extremities 04/03/2014  . Physical deconditioning 05/31/2014  . Ganglion cyst 03/12/2015  . Balance problems 03/06/2017  . Weakness of lower extremity 03/06/2017  . Cellulitis of lower extremity 05/13/2017  . Lower leg edema 05/13/2017  . Primary osteoarthritis of left shoulder 06/08/2017  . Mass of forearm, left 06/08/2017  . Acute on chronic congestive heart failure (HCC) 08/06/2017  . CKD (chronic kidney disease) stage 3, GFR 30-59 ml/min (HCC) 08/07/2017  . Normocytic anemia 08/07/2017  . SOB (shortness of breath) 08/07/2017  . Low hemoglobin  08/11/2017  . Chronic pulmonary edema 08/13/2017  . Weakness of both lower extremities 10/17/2017  . Hypoglycemia 03/04/2018  . Shaky 03/04/2018  . Left cervical radiculopathy 07/15/2018  . Pulmonary edema cardiac cause (HCC) 08/09/2018  . Superficial ulcer of skin (HCC) 12/16/2018   Resolved Ambulatory Problems    Diagnosis Date Noted  . Spinal stenosis 09/11/2011  . Sciatica of right side 09/11/2011  . Chest pain 03/05/2012  . Urinary incontinence 03/05/2012  . Abscess of heel, left 02/14/2013  . Osteoarthritis of right hip 01/30/2014  . Pressure ulcer stage II 03/12/2015  . Olecranon bursitis of left elbow 06/29/2015  . Venous stasis ulcer of thigh limited to breakdown of skin with varicose veins (HCC) 12/16/2016  . Decubitus ulcer of left buttock, stage 2 (HCC) 08/13/2017   Past Medical History:  Diagnosis Date  . Diabetes mellitus   . Gout 09/11/2011  . Obesity 09/11/2011  . Urine incontinence       Review of Systems    see HPI.  Objective:   Physical Exam Vitals signs reviewed.  Constitutional:      Appearance: Normal appearance. She is obese.  HENT:     Head: Normocephalic and atraumatic.  Cardiovascular:     Rate and Rhythm: Normal rate.  Pulmonary:     Effort: Pulmonary effort is normal.  Skin:    Comments: Bilateral sclerosed lower legs.  3cm by 2cm superficial ulcer of right lateral leg.  Cleaned with hibiclens and debrided today.   2 scant ulcers of  medial left lower leg without draining.   Neurological:     General: No focal deficit present.     Mental Status: She is alert.           Assessment & Plan:  [name] KitchenMarland KitchenAntha was seen today for follow-up.  Diagnoses and all orders for this visit:  Venous stasis dermatitis of both lower extremities  Superficial ulcer of skin (HCC)  Iron deficiency anemia secondary to inadequate dietary iron intake   Pt has not started iron yet. At pharmacy to start once a day with breakfast. Pt denies any  hematochezia or melena. No abdominal pain.   Legs improved. Re-wrapped today with unna boots. Follow up in 2weeks. Keep elevated.

## 2019-02-09 ENCOUNTER — Ambulatory Visit: Payer: Medicare Other | Admitting: Physician Assistant

## 2019-02-11 ENCOUNTER — Ambulatory Visit: Payer: Medicare Other | Admitting: Physician Assistant

## 2019-02-28 ENCOUNTER — Telehealth: Payer: Self-pay

## 2019-02-28 DIAGNOSIS — I872 Venous insufficiency (chronic) (peripheral): Secondary | ICD-10-CM

## 2019-02-28 NOTE — Telephone Encounter (Signed)
Patient left message concerned about her legs. States that her legs had been okay, but recently her left leg started swelling more and weeping.   Patient unsure if she needs to make an appt to be seen and have una boots re-applied or if she should stay out of the office.   Please advise  *Seems like a good time to get patient started with home health?*

## 2019-03-01 NOTE — Telephone Encounter (Signed)
Spoke with Pt, she agreed to try home health again. Urgent referral placed today.

## 2019-03-01 NOTE — Telephone Encounter (Signed)
Patient called after hours last night concerning leg swelling. No from after hours states: "States ehr legs are swollen and she has not been in to see her because of everything that is going around. States that have just started to drain and the right leg is worse than the left.".   I placed a call to patient and advised her that she needs to be seen in office to be evaluated, or recommended home health. Patient is extremely hesitant about home health (and has been in the past). Patient states that she nor her husband drives, so she is going to contact her sons to see if they can bring her in to be seen sometime this week. She expresses how she does not really wish to come in, but also does not want to move forward with home health at this time.  Awaiting call back from patient

## 2019-03-03 DIAGNOSIS — M797 Fibromyalgia: Secondary | ICD-10-CM | POA: Diagnosis not present

## 2019-03-03 DIAGNOSIS — N183 Chronic kidney disease, stage 3 (moderate): Secondary | ICD-10-CM | POA: Diagnosis not present

## 2019-03-03 DIAGNOSIS — I87313 Chronic venous hypertension (idiopathic) with ulcer of bilateral lower extremity: Secondary | ICD-10-CM | POA: Diagnosis not present

## 2019-03-03 DIAGNOSIS — I509 Heart failure, unspecified: Secondary | ICD-10-CM | POA: Diagnosis not present

## 2019-03-03 DIAGNOSIS — M109 Gout, unspecified: Secondary | ICD-10-CM | POA: Diagnosis not present

## 2019-03-03 DIAGNOSIS — E1122 Type 2 diabetes mellitus with diabetic chronic kidney disease: Secondary | ICD-10-CM | POA: Diagnosis not present

## 2019-03-03 DIAGNOSIS — F329 Major depressive disorder, single episode, unspecified: Secondary | ICD-10-CM | POA: Diagnosis not present

## 2019-03-03 DIAGNOSIS — Z7984 Long term (current) use of oral hypoglycemic drugs: Secondary | ICD-10-CM | POA: Diagnosis not present

## 2019-03-03 DIAGNOSIS — E785 Hyperlipidemia, unspecified: Secondary | ICD-10-CM | POA: Diagnosis not present

## 2019-03-03 DIAGNOSIS — D509 Iron deficiency anemia, unspecified: Secondary | ICD-10-CM | POA: Diagnosis not present

## 2019-03-03 DIAGNOSIS — Z7901 Long term (current) use of anticoagulants: Secondary | ICD-10-CM | POA: Diagnosis not present

## 2019-03-03 DIAGNOSIS — L97919 Non-pressure chronic ulcer of unspecified part of right lower leg with unspecified severity: Secondary | ICD-10-CM | POA: Diagnosis not present

## 2019-03-03 DIAGNOSIS — I130 Hypertensive heart and chronic kidney disease with heart failure and stage 1 through stage 4 chronic kidney disease, or unspecified chronic kidney disease: Secondary | ICD-10-CM | POA: Diagnosis not present

## 2019-03-03 DIAGNOSIS — I4891 Unspecified atrial fibrillation: Secondary | ICD-10-CM | POA: Diagnosis not present

## 2019-03-09 DIAGNOSIS — I509 Heart failure, unspecified: Secondary | ICD-10-CM | POA: Diagnosis not present

## 2019-03-09 DIAGNOSIS — E1122 Type 2 diabetes mellitus with diabetic chronic kidney disease: Secondary | ICD-10-CM | POA: Diagnosis not present

## 2019-03-09 DIAGNOSIS — N183 Chronic kidney disease, stage 3 (moderate): Secondary | ICD-10-CM | POA: Diagnosis not present

## 2019-03-09 DIAGNOSIS — I130 Hypertensive heart and chronic kidney disease with heart failure and stage 1 through stage 4 chronic kidney disease, or unspecified chronic kidney disease: Secondary | ICD-10-CM | POA: Diagnosis not present

## 2019-03-09 DIAGNOSIS — L97919 Non-pressure chronic ulcer of unspecified part of right lower leg with unspecified severity: Secondary | ICD-10-CM | POA: Diagnosis not present

## 2019-03-09 DIAGNOSIS — I87313 Chronic venous hypertension (idiopathic) with ulcer of bilateral lower extremity: Secondary | ICD-10-CM | POA: Diagnosis not present

## 2019-03-22 DIAGNOSIS — I509 Heart failure, unspecified: Secondary | ICD-10-CM | POA: Diagnosis not present

## 2019-03-22 DIAGNOSIS — N183 Chronic kidney disease, stage 3 (moderate): Secondary | ICD-10-CM | POA: Diagnosis not present

## 2019-03-22 DIAGNOSIS — E1122 Type 2 diabetes mellitus with diabetic chronic kidney disease: Secondary | ICD-10-CM | POA: Diagnosis not present

## 2019-03-22 DIAGNOSIS — I130 Hypertensive heart and chronic kidney disease with heart failure and stage 1 through stage 4 chronic kidney disease, or unspecified chronic kidney disease: Secondary | ICD-10-CM | POA: Diagnosis not present

## 2019-03-22 DIAGNOSIS — L97919 Non-pressure chronic ulcer of unspecified part of right lower leg with unspecified severity: Secondary | ICD-10-CM | POA: Diagnosis not present

## 2019-03-22 DIAGNOSIS — I87313 Chronic venous hypertension (idiopathic) with ulcer of bilateral lower extremity: Secondary | ICD-10-CM | POA: Diagnosis not present

## 2019-03-28 ENCOUNTER — Other Ambulatory Visit: Payer: Self-pay | Admitting: Physician Assistant

## 2019-03-28 DIAGNOSIS — E1122 Type 2 diabetes mellitus with diabetic chronic kidney disease: Secondary | ICD-10-CM

## 2019-03-28 DIAGNOSIS — N183 Chronic kidney disease, stage 3 (moderate): Principal | ICD-10-CM

## 2019-04-02 DIAGNOSIS — I4891 Unspecified atrial fibrillation: Secondary | ICD-10-CM | POA: Diagnosis not present

## 2019-04-02 DIAGNOSIS — E1122 Type 2 diabetes mellitus with diabetic chronic kidney disease: Secondary | ICD-10-CM | POA: Diagnosis not present

## 2019-04-02 DIAGNOSIS — D509 Iron deficiency anemia, unspecified: Secondary | ICD-10-CM | POA: Diagnosis not present

## 2019-04-02 DIAGNOSIS — I509 Heart failure, unspecified: Secondary | ICD-10-CM | POA: Diagnosis not present

## 2019-04-02 DIAGNOSIS — M797 Fibromyalgia: Secondary | ICD-10-CM | POA: Diagnosis not present

## 2019-04-02 DIAGNOSIS — F329 Major depressive disorder, single episode, unspecified: Secondary | ICD-10-CM | POA: Diagnosis not present

## 2019-04-02 DIAGNOSIS — Z7984 Long term (current) use of oral hypoglycemic drugs: Secondary | ICD-10-CM | POA: Diagnosis not present

## 2019-04-02 DIAGNOSIS — Z7901 Long term (current) use of anticoagulants: Secondary | ICD-10-CM | POA: Diagnosis not present

## 2019-04-02 DIAGNOSIS — I87313 Chronic venous hypertension (idiopathic) with ulcer of bilateral lower extremity: Secondary | ICD-10-CM | POA: Diagnosis not present

## 2019-04-02 DIAGNOSIS — I130 Hypertensive heart and chronic kidney disease with heart failure and stage 1 through stage 4 chronic kidney disease, or unspecified chronic kidney disease: Secondary | ICD-10-CM | POA: Diagnosis not present

## 2019-04-02 DIAGNOSIS — M109 Gout, unspecified: Secondary | ICD-10-CM | POA: Diagnosis not present

## 2019-04-02 DIAGNOSIS — L97919 Non-pressure chronic ulcer of unspecified part of right lower leg with unspecified severity: Secondary | ICD-10-CM | POA: Diagnosis not present

## 2019-04-02 DIAGNOSIS — N183 Chronic kidney disease, stage 3 (moderate): Secondary | ICD-10-CM | POA: Diagnosis not present

## 2019-04-02 DIAGNOSIS — E785 Hyperlipidemia, unspecified: Secondary | ICD-10-CM | POA: Diagnosis not present

## 2019-04-06 DIAGNOSIS — I87313 Chronic venous hypertension (idiopathic) with ulcer of bilateral lower extremity: Secondary | ICD-10-CM | POA: Diagnosis not present

## 2019-04-06 DIAGNOSIS — E1122 Type 2 diabetes mellitus with diabetic chronic kidney disease: Secondary | ICD-10-CM | POA: Diagnosis not present

## 2019-04-06 DIAGNOSIS — L97919 Non-pressure chronic ulcer of unspecified part of right lower leg with unspecified severity: Secondary | ICD-10-CM | POA: Diagnosis not present

## 2019-04-06 DIAGNOSIS — N183 Chronic kidney disease, stage 3 (moderate): Secondary | ICD-10-CM | POA: Diagnosis not present

## 2019-04-06 DIAGNOSIS — I509 Heart failure, unspecified: Secondary | ICD-10-CM | POA: Diagnosis not present

## 2019-04-06 DIAGNOSIS — I130 Hypertensive heart and chronic kidney disease with heart failure and stage 1 through stage 4 chronic kidney disease, or unspecified chronic kidney disease: Secondary | ICD-10-CM | POA: Diagnosis not present

## 2019-04-14 DIAGNOSIS — I87313 Chronic venous hypertension (idiopathic) with ulcer of bilateral lower extremity: Secondary | ICD-10-CM | POA: Diagnosis not present

## 2019-04-14 DIAGNOSIS — I509 Heart failure, unspecified: Secondary | ICD-10-CM | POA: Diagnosis not present

## 2019-04-14 DIAGNOSIS — L97919 Non-pressure chronic ulcer of unspecified part of right lower leg with unspecified severity: Secondary | ICD-10-CM | POA: Diagnosis not present

## 2019-04-14 DIAGNOSIS — E1122 Type 2 diabetes mellitus with diabetic chronic kidney disease: Secondary | ICD-10-CM | POA: Diagnosis not present

## 2019-04-14 DIAGNOSIS — I130 Hypertensive heart and chronic kidney disease with heart failure and stage 1 through stage 4 chronic kidney disease, or unspecified chronic kidney disease: Secondary | ICD-10-CM | POA: Diagnosis not present

## 2019-04-14 DIAGNOSIS — N183 Chronic kidney disease, stage 3 (moderate): Secondary | ICD-10-CM | POA: Diagnosis not present

## 2019-04-21 DIAGNOSIS — I130 Hypertensive heart and chronic kidney disease with heart failure and stage 1 through stage 4 chronic kidney disease, or unspecified chronic kidney disease: Secondary | ICD-10-CM | POA: Diagnosis not present

## 2019-04-21 DIAGNOSIS — E1122 Type 2 diabetes mellitus with diabetic chronic kidney disease: Secondary | ICD-10-CM | POA: Diagnosis not present

## 2019-04-21 DIAGNOSIS — I87313 Chronic venous hypertension (idiopathic) with ulcer of bilateral lower extremity: Secondary | ICD-10-CM | POA: Diagnosis not present

## 2019-04-21 DIAGNOSIS — I509 Heart failure, unspecified: Secondary | ICD-10-CM | POA: Diagnosis not present

## 2019-04-21 DIAGNOSIS — N183 Chronic kidney disease, stage 3 (moderate): Secondary | ICD-10-CM | POA: Diagnosis not present

## 2019-04-21 DIAGNOSIS — L97919 Non-pressure chronic ulcer of unspecified part of right lower leg with unspecified severity: Secondary | ICD-10-CM | POA: Diagnosis not present

## 2019-05-02 DIAGNOSIS — F329 Major depressive disorder, single episode, unspecified: Secondary | ICD-10-CM | POA: Diagnosis not present

## 2019-05-02 DIAGNOSIS — E1122 Type 2 diabetes mellitus with diabetic chronic kidney disease: Secondary | ICD-10-CM | POA: Diagnosis not present

## 2019-05-02 DIAGNOSIS — L97919 Non-pressure chronic ulcer of unspecified part of right lower leg with unspecified severity: Secondary | ICD-10-CM | POA: Diagnosis not present

## 2019-05-02 DIAGNOSIS — Z7984 Long term (current) use of oral hypoglycemic drugs: Secondary | ICD-10-CM | POA: Diagnosis not present

## 2019-05-02 DIAGNOSIS — I130 Hypertensive heart and chronic kidney disease with heart failure and stage 1 through stage 4 chronic kidney disease, or unspecified chronic kidney disease: Secondary | ICD-10-CM | POA: Diagnosis not present

## 2019-05-02 DIAGNOSIS — M109 Gout, unspecified: Secondary | ICD-10-CM | POA: Diagnosis not present

## 2019-05-02 DIAGNOSIS — E785 Hyperlipidemia, unspecified: Secondary | ICD-10-CM | POA: Diagnosis not present

## 2019-05-02 DIAGNOSIS — N183 Chronic kidney disease, stage 3 (moderate): Secondary | ICD-10-CM | POA: Diagnosis not present

## 2019-05-02 DIAGNOSIS — I509 Heart failure, unspecified: Secondary | ICD-10-CM | POA: Diagnosis not present

## 2019-05-02 DIAGNOSIS — M797 Fibromyalgia: Secondary | ICD-10-CM | POA: Diagnosis not present

## 2019-05-02 DIAGNOSIS — Z7901 Long term (current) use of anticoagulants: Secondary | ICD-10-CM | POA: Diagnosis not present

## 2019-05-02 DIAGNOSIS — I87313 Chronic venous hypertension (idiopathic) with ulcer of bilateral lower extremity: Secondary | ICD-10-CM | POA: Diagnosis not present

## 2019-05-02 DIAGNOSIS — I4891 Unspecified atrial fibrillation: Secondary | ICD-10-CM | POA: Diagnosis not present

## 2019-05-02 DIAGNOSIS — D509 Iron deficiency anemia, unspecified: Secondary | ICD-10-CM | POA: Diagnosis not present

## 2019-05-05 DIAGNOSIS — I130 Hypertensive heart and chronic kidney disease with heart failure and stage 1 through stage 4 chronic kidney disease, or unspecified chronic kidney disease: Secondary | ICD-10-CM | POA: Diagnosis not present

## 2019-05-05 DIAGNOSIS — I87313 Chronic venous hypertension (idiopathic) with ulcer of bilateral lower extremity: Secondary | ICD-10-CM | POA: Diagnosis not present

## 2019-05-05 DIAGNOSIS — L97919 Non-pressure chronic ulcer of unspecified part of right lower leg with unspecified severity: Secondary | ICD-10-CM | POA: Diagnosis not present

## 2019-05-05 DIAGNOSIS — I509 Heart failure, unspecified: Secondary | ICD-10-CM | POA: Diagnosis not present

## 2019-05-05 DIAGNOSIS — N183 Chronic kidney disease, stage 3 (moderate): Secondary | ICD-10-CM | POA: Diagnosis not present

## 2019-05-05 DIAGNOSIS — E1122 Type 2 diabetes mellitus with diabetic chronic kidney disease: Secondary | ICD-10-CM | POA: Diagnosis not present

## 2019-05-12 ENCOUNTER — Telehealth: Payer: Self-pay

## 2019-05-12 DIAGNOSIS — L97919 Non-pressure chronic ulcer of unspecified part of right lower leg with unspecified severity: Secondary | ICD-10-CM | POA: Diagnosis not present

## 2019-05-12 DIAGNOSIS — I87313 Chronic venous hypertension (idiopathic) with ulcer of bilateral lower extremity: Secondary | ICD-10-CM | POA: Diagnosis not present

## 2019-05-12 DIAGNOSIS — E1122 Type 2 diabetes mellitus with diabetic chronic kidney disease: Secondary | ICD-10-CM | POA: Diagnosis not present

## 2019-05-12 DIAGNOSIS — N183 Chronic kidney disease, stage 3 (moderate): Secondary | ICD-10-CM | POA: Diagnosis not present

## 2019-05-12 DIAGNOSIS — L03119 Cellulitis of unspecified part of limb: Secondary | ICD-10-CM

## 2019-05-12 DIAGNOSIS — L98491 Non-pressure chronic ulcer of skin of other sites limited to breakdown of skin: Secondary | ICD-10-CM

## 2019-05-12 DIAGNOSIS — I872 Venous insufficiency (chronic) (peripheral): Secondary | ICD-10-CM

## 2019-05-12 DIAGNOSIS — I509 Heart failure, unspecified: Secondary | ICD-10-CM | POA: Diagnosis not present

## 2019-05-12 DIAGNOSIS — I130 Hypertensive heart and chronic kidney disease with heart failure and stage 1 through stage 4 chronic kidney disease, or unspecified chronic kidney disease: Secondary | ICD-10-CM | POA: Diagnosis not present

## 2019-05-12 MED ORDER — DOXYCYCLINE HYCLATE 100 MG PO TABS: 100.0000 mg | ORAL_TABLET | Freq: Two times a day (BID) | ORAL | 0 refills | Status: DC

## 2019-05-12 NOTE — Telephone Encounter (Signed)
[name] from [facility] called with update. [name] reports patient complained of some left leg pain last night and today left leg is a little more red than usual, weeping with puss.   [name] wanted to know if provider had any new recommendations. They are doing [employer]

## 2019-05-12 NOTE — Telephone Encounter (Signed)
Will send doxycycline 100mg  bid for patient to start. Continue unna boots.

## 2019-05-13 NOTE — Telephone Encounter (Signed)
Home Health advised.  

## 2019-05-13 NOTE — Telephone Encounter (Signed)
Called and left [name] a msg to call back for orders

## 2019-05-18 DIAGNOSIS — L97919 Non-pressure chronic ulcer of unspecified part of right lower leg with unspecified severity: Secondary | ICD-10-CM | POA: Diagnosis not present

## 2019-05-18 DIAGNOSIS — N183 Chronic kidney disease, stage 3 (moderate): Secondary | ICD-10-CM | POA: Diagnosis not present

## 2019-05-18 DIAGNOSIS — I509 Heart failure, unspecified: Secondary | ICD-10-CM | POA: Diagnosis not present

## 2019-05-18 DIAGNOSIS — I87313 Chronic venous hypertension (idiopathic) with ulcer of bilateral lower extremity: Secondary | ICD-10-CM | POA: Diagnosis not present

## 2019-05-18 DIAGNOSIS — E1122 Type 2 diabetes mellitus with diabetic chronic kidney disease: Secondary | ICD-10-CM | POA: Diagnosis not present

## 2019-05-18 DIAGNOSIS — I130 Hypertensive heart and chronic kidney disease with heart failure and stage 1 through stage 4 chronic kidney disease, or unspecified chronic kidney disease: Secondary | ICD-10-CM | POA: Diagnosis not present

## 2019-05-18 NOTE — Progress Notes (Deleted)
Subjective:   Bonnie Clark is a 78 y.o. female who presents for an Initial Medicare Annual Wellness Visit.  Review of Systems    No ROS.  Medicare Wellness Virtual Visit.  Visual/audio telehealth visit, UTA vital signs.   See social history for additional risk factors.       Sleep patterns:    Home Safety/Smoke Alarms: Feels safe in home. Smoke alarms in place.  Living environment; Seat Belt Safety/Bike Helmet: Wears seat belt.   Female:   Pap- Aged out      Mammo- Aged out      Dexa scan-        CCS- Aged out     Objective:    There were no vitals filed for this visit. There is no height or weight on file to calculate BMI.  Advanced Directives 08/16/2014 05/05/2014  Does Patient Have a Medical Advance Directive? Yes Patient has advance directive, copy not in chart;Patient would not like information  Does patient want to make changes to medical advance directive? No - Patient declined -  Copy of Healthcare Power of Attorney in Chart? No - copy requested -    Current Medications (verified) Outpatient Encounter Medications as of 05/24/2019  Medication Sig  . allopurinol (ZYLOPRIM) 300 MG tablet TAKE 1 TABLET BY MOUTH ONCE DAILY.  [name] Kitchen AMBULATORY NON FORMULARY MEDICATION Knee-high, medium compression, graduated compression stockings. Apply to lower extremities. Zippered Compression Stockings, large circ, long length/knee high  . AMBULATORY NON FORMULARY MEDICATION Need for one power wheelchair due to balance instability, lower extremity weakness, and lumbar DDD.  DX: R26.89, R29.898, M51.36  . AMBULATORY NON FORMULARY MEDICATION Need for one power wheelchair due to balance instability, lower extremity weakness, and lumbar DDD.  DX: R26.89, R29.898, M51.36  . apixaban (ELIQUIS) 5 MG TABS tablet Take 1 tablet (5 mg total) by mouth 2 (two) times daily.  [name] Kitchen atorvastatin (LIPITOR) 40 MG tablet TAKE 1 TABLET BY MOUTH ONCE DAILY AT 6 PM.  . Bismuth Tribromoph-Petrolatum (XEROFORM  PETROLAT PATCH 4"X4") PADS Apply to irritated skin daily, replace daily as needed.  . clobetasol ointment (TEMOVATE) 0.05 % Apply 1 application topically 2 (two) times daily. (Patient taking differently: Apply 1 application topically as directed. )  . clonazePAM (KLONOPIN) 1 MG tablet TAKE ONE-HALF TO 1 TABLET BY MOUTH TWICE DAILY AS NEEDED FOR ANXIETY  . Continuous Blood Gluc Receiver (FREESTYLE LIBRE 14 DAY READER) DEVI 1 applicator by Does not apply route every 14 (fourteen) days.  . Continuous Blood Gluc Sensor (FREESTYLE LIBRE 14 DAY SENSOR) MISC 1 Device by Does not apply route every 14 (fourteen) days.  . diclofenac sodium (VOLTAREN) 1 % GEL Apply 4 g topically 4 (four) times daily.  [name] Kitchen doxycycline (VIBRA-TABS) 100 MG tablet Take 1 tablet (100 mg total) by mouth 2 (two) times daily.  . Dulaglutide (TRULICITY) 0.75 MG/0.5ML SOPN INJECT 1  SUBCUTANEOUSLY ONCE A WEEK  . DULoxetine (CYMBALTA) 60 MG capsule Take 2 capsules (120 mg total) by mouth daily.  . ferrous sulfate 325 (65 FE) MG EC tablet Take 1 tablet (325 mg total) by mouth daily with breakfast.  . glimepiride (AMARYL) 4 MG tablet Take 1 tablet (4 mg total) by mouth 2 (two) times daily with a meal.  . glucose blood (ONE TOUCH ULTRA TEST) test strip Use to test blood sugar 2 times daily as instructed. Dx code: E11.9  . HYDROcodone-acetaminophen (NORCO) 7.5-325 MG tablet Take 1 tablet by mouth every 6 (six) hours as needed for  moderate pain (cough).  [name] Kitchen. JANUMET XR [phone] MG TB24 Take 1 tablet by mouth once daily  . lisinopril (PRINIVIL,ZESTRIL) 5 MG tablet TAKE 1 TABLET BY MOUTH ONCE DAILY.  . metoprolol tartrate (LOPRESSOR) 100 MG tablet Take 1 tablet (100 mg total) by mouth 2 (two) times daily.  . pregabalin (LYRICA) 200 MG capsule Take 1 capsule (200 mg total) by mouth 3 (three) times daily.  [name] Kitchen. tiZANidine (ZANAFLEX) 4 MG tablet Take one tablet as needed for every 6-8 hours for muscle spasms and pain.  [name] Kitchen. torsemide (DEMADEX) 20 MG tablet  Take 1 tablet (20 mg total) by mouth daily.  . traZODone (DESYREL) 50 MG tablet Take 0.5-1 tablets (25-50 mg total) by mouth at bedtime as needed for sleep.   No facility-administered encounter medications on file as of 05/24/2019.     Allergies (verified) Glipizide, Myrbetriq [mirabegron], and Voltaren [diclofenac sodium]   History: Past Medical History:  Diagnosis Date  . Anxiety and depression 09/11/2011  . Arthritis   . Atrial fibrillation (HCC)   . Chronic insomnia 09/11/2011  . CKD (chronic kidney disease) stage 3, GFR 30-59 ml/min (HCC) 08/07/2017  . Diabetes mellitus   . Fibromyalgia 09/11/2011  . Gait disorder 12/21/2011  . Gout 09/11/2011  . Hyperlipidemia   . Hypertension   . Normocytic anemia 08/07/2017  . OAB (overactive bladder) 06/21/2012  . Obesity 09/11/2011  . Peripheral edema 06/21/2012  . Sciatica of right side 09/11/2011  . Spinal stenosis 09/11/2011  . Thyroid disease   . Urine incontinence    Past Surgical History:  Procedure Laterality Date  . REPLACEMENT TOTAL KNEE BILATERAL     right approx 1994, left approx 1999  . SHOULDER SURGERY     right - approx 2005  . THYROIDECTOMY, PARTIAL     at 78yo   Family History  Problem Relation Age of Onset  . Arthritis Mother   . Depression Mother   . Arthritis Father   . Stroke Sister    Social History   Socioeconomic History  . Marital status: Married    Spouse name: Not on file  . Number of children: 3  . Years of education: Not on file  . Highest education level: Not on file  Occupational History  . Not on file  Social Needs  . Financial resource strain: Not on file  . Food insecurity    Worry: Not on file    Inability: Not on file  . Transportation needs    Medical: Not on file    Non-medical: Not on file  Tobacco Use  . Smoking status: Never Smoker  . Smokeless tobacco: Never Used  Substance and Sexual Activity  . Alcohol use: No  . Drug use: No  . Sexual activity: Not on file   Lifestyle  . Physical activity    Days per week: Not on file    Minutes per session: Not on file  . Stress: Not on file  Relationships  . Social Musicianconnections    Talks on phone: Not on file    Gets together: Not on file    Attends religious service: Not on file    Active member of club or organization: Not on file    Attends meetings of clubs or organizations: Not on file    Relationship status: Not on file  Other Topics Concern  . Not on file  Social History Narrative  . Not on file    Tobacco Counseling Counseling given: Not Answered  Clinical Intake:                        Activities of Daily Living No flowsheet data found.   Immunizations and Health Maintenance Immunization History  Administered Date(s) Administered  . Influenza Split 09/26/2011  . Influenza, High Dose Seasonal PF 08/25/2017  . Influenza,inj,Quad PF,6+ Mos 09/01/2014  . Pneumococcal Conjugate-13 10/13/2017  . Pneumococcal Polysaccharide-23 11/23/2018   Health Maintenance Due  Topic Date Due  . OPHTHALMOLOGY EXAM  03/06/2018  . HEMOGLOBIN A1C  04/26/2019    Patient Care Team: Lavada Mesi as PCP - General (Family Medicine) Stanford Breed Denice Bors, MD as Consulting Physician (Cardiology) Judene Companion, MD (Inactive) as Consulting Physician (General Surgery)  Indicate any recent [place] you may have received from other than [facility] providers in the past year (date may be approximate).     Assessment:   This is a routine wellness examination for Loie.Physical assessment deferred to PCP.   Hearing/Vision screen No exam data present  Dietary issues and exercise activities discussed:   Diet  Breakfast: Lunch:  Dinner:       Goals   None    Depression Screen PHQ 2/9 Scores 05/24/2018 05/05/2014  PHQ - 2 Score - 0  Exception Documentation Patient refusal -    Fall Risk Fall Risk  08/06/2017 06/15/2017 05/05/2014  Falls in the past year? - No No  Comment -  Emmi Telephone Survey: data to providers prior to load -  Risk for fall due to : Impaired mobility - -    Is the patient's home free of loose throw rugs in walkways, pet beds, electrical cords, etc?   {Blank single:19197::"yes","no"}      Grab bars in the bathroom? {Blank single:19197::"yes","no"}      Handrails on the stairs?   {Blank single:19197::"yes","no"}      Adequate lighting?   {Blank single:19197::"yes","no"}  Cognitive Function:        Screening Tests Health Maintenance  Topic Date Due  . OPHTHALMOLOGY EXAM  03/06/2018  . HEMOGLOBIN A1C  04/26/2019  . DEXA SCAN  11/24/2019 (Originally 02/22/2007)  . INFLUENZA VACCINE  06/25/2019  . FOOT EXAM  11/24/2019  . TETANUS/TDAP  11/24/2020  . PNA vac Low Risk Adult  Completed        Plan:   ***  I have personally reviewed and noted the following in the patient's chart:   . Medical and social history . Use of alcohol, tobacco or illicit drugs  . Current medications and supplements . Functional ability and status . Nutritional status . Physical activity . Advanced directives . List of other physicians . Hospitalizations, surgeries, and ER visits in previous 12 months . Vitals . Screenings to include cognitive, depression, and falls . Referrals and appointments  In addition, I have reviewed and discussed with patient certain preventive protocols, quality metrics, and best practice recommendations. A written personalized care plan for preventive services as well as general preventive health recommendations were provided to patient.     Joanne Chars, LPN   [id number]

## 2019-05-24 ENCOUNTER — Ambulatory Visit (INDEPENDENT_AMBULATORY_CARE_PROVIDER_SITE_OTHER): Payer: Medicare Other | Admitting: *Deleted

## 2019-05-24 ENCOUNTER — Ambulatory Visit: Payer: Medicare Other

## 2019-05-24 VITALS — BP 147/92 | HR 71 | Temp 97.7°F | Ht 67.0 in | Wt 330.0 lb

## 2019-05-24 DIAGNOSIS — Z0000 Encounter for general adult medical examination without abnormal findings: Secondary | ICD-10-CM | POA: Diagnosis not present

## 2019-05-24 NOTE — Progress Notes (Addendum)
Subjective:   Bonnie Clark is a 78 y.o. female who presents for an Initial Medicare Annual Wellness Visit via tele medicine. Pt agrees to proceed with visit.   Review of Systems    No ROS.  Medicare Wellness Virtual Visit.  Visual/audio telehealth visit, UTA vital signs.   See social history for additional risk factors.     Cardiac Risk Factors include: advanced age (>83men, >62 women);obesity (BMI >30kg/m2);sedentary lifestyle;diabetes mellitus;hypertension Sleep patterns:  Getting on average 1-2 hours of sleep a night.Doesn't wake up to void as she is up most of the night. Wakes up and feels sluggish.  Home Safety/Smoke Alarms: Feels safe in home. Smoke alarms in place.  Living environment; Lives with husband in 2 story home stairs have hand rails on them . Shower is a walk in shower with grab bars in place. Seat Belt Safety/Bike Helmet: Wears seat belt.   Female:   Pap-   Aged out    Mammo-  Aged out      Dexa scan-  declined      CCS- aged out      Objective:    There were no vitals filed for this visit. There is no height or weight on file to calculate BMI.  Advanced Directives 05/24/2019 08/16/2014 05/05/2014  Does Patient Have a Medical Advance Directive? Yes Yes Patient has advance directive, copy not in chart;Patient would not like information  Type of Advance Directive Healthcare Power of [place];Living will - -  Does patient want to make changes to medical advance directive? No - Patient declined No - Patient declined -  Copy of Healthcare Power of Attorney in Chart? No - copy requested No - copy requested -    Current Medications (verified) Outpatient Encounter Medications as of 05/24/2019  Medication Sig  . allopurinol (ZYLOPRIM) 300 MG tablet TAKE 1 TABLET BY MOUTH ONCE DAILY.  [name] Kitchen AMBULATORY NON FORMULARY MEDICATION Need for one power wheelchair due to balance instability, lower extremity weakness, and lumbar DDD.  DX: R26.89, R29.898, M51.36  . AMBULATORY NON  FORMULARY MEDICATION Need for one power wheelchair due to balance instability, lower extremity weakness, and lumbar DDD.  DX: R26.89, R29.898, M51.36  . apixaban (ELIQUIS) 5 MG TABS tablet Take 1 tablet (5 mg total) by mouth 2 (two) times daily.  [name] Kitchen atorvastatin (LIPITOR) 40 MG tablet TAKE 1 TABLET BY MOUTH ONCE DAILY AT 6 PM.  . Bismuth Tribromoph-Petrolatum (XEROFORM PETROLAT PATCH 4"X4") PADS Apply to irritated skin daily, replace daily as needed.  . Continuous Blood Gluc Receiver (FREESTYLE LIBRE 14 DAY READER) DEVI 1 applicator by Does not apply route every 14 (fourteen) days.  . Continuous Blood Gluc Sensor (FREESTYLE LIBRE 14 DAY SENSOR) MISC 1 Device by Does not apply route every 14 (fourteen) days.  . Dulaglutide (TRULICITY) 0.75 MG/0.5ML SOPN INJECT 1  SUBCUTANEOUSLY ONCE A WEEK  . DULoxetine (CYMBALTA) 60 MG capsule Take 2 capsules (120 mg total) by mouth daily.  . ferrous sulfate 325 (65 FE) MG EC tablet Take 1 tablet (325 mg total) by mouth daily with breakfast.  . glimepiride (AMARYL) 4 MG tablet Take 1 tablet (4 mg total) by mouth 2 (two) times daily with a meal.  . glucose blood (ONE TOUCH ULTRA TEST) test strip Use to test blood sugar 2 times daily as instructed. Dx code: E11.9  . HYDROcodone-acetaminophen (NORCO) 7.5-325 MG tablet Take 1 tablet by mouth every 6 (six) hours as needed for moderate pain (cough).  [name] Kitchen JANUMET XR [phone] MG  TB24 Take 1 tablet by mouth once daily  . lisinopril (PRINIVIL,ZESTRIL) 5 MG tablet TAKE 1 TABLET BY MOUTH ONCE DAILY.  . metoprolol tartrate (LOPRESSOR) 100 MG tablet Take 1 tablet (100 mg total) by mouth 2 (two) times daily.  [name] Kitchen. tiZANidine (ZANAFLEX) 4 MG tablet Take one tablet as needed for every 6-8 hours for muscle spasms and pain.  [name] Kitchen. torsemide (DEMADEX) 20 MG tablet Take 1 tablet (20 mg total) by mouth daily.  . AMBULATORY NON FORMULARY MEDICATION Knee-high, medium compression, graduated compression stockings. Apply to lower extremities.  Zippered Compression Stockings, large circ, long length/knee high (Patient not taking: Reported on 05/24/2019)  . clobetasol ointment (TEMOVATE) 0.05 % Apply 1 application topically 2 (two) times daily. (Patient not taking: Reported on 05/24/2019)  . clonazePAM (KLONOPIN) 1 MG tablet TAKE ONE-HALF TO 1 TABLET BY MOUTH TWICE DAILY AS NEEDED FOR ANXIETY (Patient not taking: Reported on 05/24/2019)  . diclofenac sodium (VOLTAREN) 1 % GEL Apply 4 g topically 4 (four) times daily. (Patient not taking: Reported on 05/24/2019)  . pregabalin (LYRICA) 200 MG capsule Take 1 capsule (200 mg total) by mouth 3 (three) times daily. (Patient not taking: Reported on 05/24/2019)  . traZODone (DESYREL) 50 MG tablet Take 0.5-1 tablets (25-50 mg total) by mouth at bedtime as needed for sleep. (Patient not taking: Reported on 05/24/2019)  . [DISCONTINUED] doxycycline (VIBRA-TABS) 100 MG tablet Take 1 tablet (100 mg total) by mouth 2 (two) times daily.   No facility-administered encounter medications on file as of 05/24/2019.     Allergies (verified) Glipizide, Myrbetriq [mirabegron], and Voltaren [diclofenac sodium]   History: Past Medical History:  Diagnosis Date  . Anxiety and depression 09/11/2011  . Arthritis   . Atrial fibrillation (HCC)   . Chronic insomnia 09/11/2011  . CKD (chronic kidney disease) stage 3, GFR 30-59 ml/min (HCC) 08/07/2017  . Diabetes mellitus   . Fibromyalgia 09/11/2011  . Gait disorder 12/21/2011  . Gout 09/11/2011  . Hyperlipidemia   . Hypertension   . Normocytic anemia 08/07/2017  . OAB (overactive bladder) 06/21/2012  . Obesity 09/11/2011  . Peripheral edema 06/21/2012  . Sciatica of right side 09/11/2011  . Spinal stenosis 09/11/2011  . Thyroid disease   . Urine incontinence    Past Surgical History:  Procedure Laterality Date  . REPLACEMENT TOTAL KNEE BILATERAL     right approx 1994, left approx 1999  . SHOULDER SURGERY     right - approx 2005  . THYROIDECTOMY, PARTIAL      at 78yo   Family History  Problem Relation Age of Onset  . Arthritis Mother   . Depression Mother   . Arthritis Father   . Stroke Sister    Social History   Socioeconomic History  . Marital status: Married    Spouse name: Richard  . Number of children: 3  . Years of education: 3614  . Highest education level: Associate degree: academic program  Occupational History  . Occupation: Charity fundraiserN    Comment: retired  Engineer, productionocial Needs  . Financial resource strain: Not hard at all  . Food insecurity    Worry: Never true    Inability: Never true  . Transportation needs    Medical: No    Non-medical: No  Tobacco Use  . Smoking status: Never Smoker  . Smokeless tobacco: Never Used  Substance and Sexual Activity  . Alcohol use: No  . Drug use: No  . Sexual activity: Not Currently  Lifestyle  . Physical activity  Days per week: 0 days    Minutes per session: 0 min  . Stress: Rather much  Relationships  . Social Herbalist on phone: Once a week    Gets together: Once a week    Attends religious service: More than 4 times per year    Active member of club or organization: No    Attends meetings of clubs or organizations: Never    Relationship status: Married  Other Topics Concern  . Not on file  Social History Narrative   Helps take care of her husband    Tobacco Counseling Counseling given: Not Answered   Clinical Intake:  Pre-visit preparation completed: Yes  Pain : No/denies pain     Nutritional Risks: None Diabetes: Yes CBG done?: No Did pt. bring in CBG monitor from home?: No  How often do you need to have someone help you when you read instructions, pamphlets, or other written materials from your doctor or pharmacy?: 1 - Never What is the last grade level you completed in school?: 14  Interpreter Needed?: No  Information entered by :: Orlie Dakin, LPN   Activities of Daily Living In your present state of health, do you have any difficulty performing  the following activities: 05/24/2019  Hearing? N  Vision? N  Difficulty concentrating or making decisions? N  Walking or climbing stairs? Y  Comment uses walker and has a wheelchair now  Dressing or bathing? N  Doing errands, shopping? N  Preparing Food and eating ? Y  Comment son cooks for them  Using the Toilet? N  In the past six months, have you accidently leaked urine? Y  Do you have problems with loss of bowel control? N  Managing your Medications? N  Managing your Finances? N  Housekeeping or managing your Housekeeping? N  Some recent data might be hidden     Immunizations and Health Maintenance Immunization History  Administered Date(s) Administered  . Influenza Split 09/26/2011  . Influenza, High Dose Seasonal PF 08/25/2017  . Influenza,inj,Quad PF,6+ Mos 09/01/2014  . Pneumococcal Conjugate-13 10/13/2017  . Pneumococcal Polysaccharide-23 11/23/2018   Health Maintenance Due  Topic Date Due  . OPHTHALMOLOGY EXAM  03/06/2018  . HEMOGLOBIN A1C  04/26/2019    Patient Care Team: Lavada Mesi as PCP - General (Family Medicine) Stanford Breed Denice Bors, MD as Consulting Physician (Cardiology) Judene Companion, MD (Inactive) as Consulting Physician (General Surgery)  Indicate any recent Welcome you may have received from other than [facility] providers in the past year (date may be approximate).     Assessment:   This is a routine wellness examination for Lateasha.  Hearing/Vision screen  Hearing Screening   125Hz  250Hz  500Hz  1000Hz  2000Hz  3000Hz  4000Hz  6000Hz  8000Hz   Right ear:           Left ear:           Comments: Hearing test not done due to visit done over the phone due to [place] pandemic  Vision Screening Comments: Vision test not done , visit done in office due to the [place] pandemic  Dietary issues and exercise activities discussed: Current Exercise Habits: The patient does not participate in regular exercise at present, Exercise limited by:  orthopedic condition(s);cardiac condition(s)  Goals   None    Depression Screen [facility] 2/9 Scores 05/24/2019 05/24/2018 05/05/2014  PHQ - 2 Score 0 - 0  Exception Documentation - Patient refusal -    Fall Risk Fall Risk  05/24/2019 08/06/2017  06/15/2017 05/05/2014  Falls in the past year? 0 - No No  Comment - - Emmi Telephone Survey: data to providers prior to load -  Risk for fall due to : Impaired mobility;Impaired balance/gait Impaired mobility - -  Follow up Falls prevention discussed - - -    Is the patient's home free of loose throw rugs in walkways, pet beds, electrical cords, etc?   yes      Grab bars in the bathroom? yes      Handrails on the stairs?   yes      Adequate lighting?   yes    Cognitive Function:        Screening Tests Health Maintenance  Topic Date Due  . OPHTHALMOLOGY EXAM  03/06/2018  . HEMOGLOBIN A1C  04/26/2019  . DEXA SCAN  11/24/2019 (Originally 02/22/2007)  . INFLUENZA VACCINE  06/25/2019  . FOOT EXAM  11/24/2019  . TETANUS/TDAP  11/24/2020  . PNA vac Low Risk Adult  Completed       Plan:    Ms. Channing MuttersRoy , Thank you for taking time to come for your Medicare Wellness Visit. I appreciate your ongoing commitment to your health goals. Please review the following plan we discussed and let me know if I can assist you in the future. Please schedule your next medicare wellness visit with me in 1 yr. Continue doing brain stimulating activities (puzzles, reading, adult coloring books, staying active) to keep memory sharp.     These are the goals we discussed: Goals   None     This is a list of the screening recommended for you and due dates:  Health Maintenance  Topic Date Due  . Eye exam for diabetics  03/06/2018  . Hemoglobin A1C  04/26/2019  . DEXA scan (bone density measurement)  11/24/2019*  . Flu Shot  06/25/2019  . Complete foot exam   11/24/2019  . Tetanus Vaccine  11/24/2020  . Pneumonia vaccines  Completed  *Topic was postponed. The  date shown is not the original due date.       I have personally reviewed and noted the following in the patient's chart:   . Medical and social history . Use of alcohol, tobacco or illicit drugs  . Current medications and supplements . Functional ability and status . Nutritional status . Physical activity . Advanced directives . List of other physicians . Hospitalizations, surgeries, and ER visits in previous 12 months . Vitals . Screenings to include cognitive, depression, and falls . Referrals and appointments  In addition, I have reviewed and discussed with patient certain preventive protocols, quality metrics, and best practice recommendations. A written personalized care plan for preventive services as well as general preventive health recommendations were provided to patient.     Normand SloopKimberly A , LPN   1/61/09606/30/2020     .[name] Kitchen.Medical screening examination/treatment was performed by qualified clinical staff member and as supervising physician I was immediately available for consultation/collaboration. I have reviewed documentation and agree with assessment and plan.  Tandy GawJade Breeback, PA-C

## 2019-05-24 NOTE — Patient Instructions (Signed)
Ms. Panozzo , Thank you for taking time to come for your Medicare Wellness Visit. I appreciate your ongoing commitment to your health goals. Please review the following plan we discussed and let me know if I can assist you in the future. Please schedule your next medicare wellness visit with me in 1 yr. Continue doing brain stimulating activities (puzzles, reading, adult coloring books, staying active) to keep memory sharp.

## 2019-05-26 ENCOUNTER — Other Ambulatory Visit: Payer: Self-pay | Admitting: Physician Assistant

## 2019-05-26 DIAGNOSIS — L98491 Non-pressure chronic ulcer of skin of other sites limited to breakdown of skin: Secondary | ICD-10-CM

## 2019-05-26 DIAGNOSIS — L03119 Cellulitis of unspecified part of limb: Secondary | ICD-10-CM

## 2019-05-26 DIAGNOSIS — I872 Venous insufficiency (chronic) (peripheral): Secondary | ICD-10-CM

## 2019-06-01 DIAGNOSIS — Z7984 Long term (current) use of oral hypoglycemic drugs: Secondary | ICD-10-CM | POA: Diagnosis not present

## 2019-06-01 DIAGNOSIS — Z7901 Long term (current) use of anticoagulants: Secondary | ICD-10-CM | POA: Diagnosis not present

## 2019-06-01 DIAGNOSIS — I509 Heart failure, unspecified: Secondary | ICD-10-CM | POA: Diagnosis not present

## 2019-06-01 DIAGNOSIS — D509 Iron deficiency anemia, unspecified: Secondary | ICD-10-CM | POA: Diagnosis not present

## 2019-06-01 DIAGNOSIS — I4891 Unspecified atrial fibrillation: Secondary | ICD-10-CM | POA: Diagnosis not present

## 2019-06-01 DIAGNOSIS — M109 Gout, unspecified: Secondary | ICD-10-CM | POA: Diagnosis not present

## 2019-06-01 DIAGNOSIS — M797 Fibromyalgia: Secondary | ICD-10-CM | POA: Diagnosis not present

## 2019-06-01 DIAGNOSIS — E785 Hyperlipidemia, unspecified: Secondary | ICD-10-CM | POA: Diagnosis not present

## 2019-06-01 DIAGNOSIS — L97919 Non-pressure chronic ulcer of unspecified part of right lower leg with unspecified severity: Secondary | ICD-10-CM | POA: Diagnosis not present

## 2019-06-01 DIAGNOSIS — I87313 Chronic venous hypertension (idiopathic) with ulcer of bilateral lower extremity: Secondary | ICD-10-CM | POA: Diagnosis not present

## 2019-06-01 DIAGNOSIS — F329 Major depressive disorder, single episode, unspecified: Secondary | ICD-10-CM | POA: Diagnosis not present

## 2019-06-01 DIAGNOSIS — I130 Hypertensive heart and chronic kidney disease with heart failure and stage 1 through stage 4 chronic kidney disease, or unspecified chronic kidney disease: Secondary | ICD-10-CM | POA: Diagnosis not present

## 2019-06-01 DIAGNOSIS — N183 Chronic kidney disease, stage 3 (moderate): Secondary | ICD-10-CM | POA: Diagnosis not present

## 2019-06-01 DIAGNOSIS — E1122 Type 2 diabetes mellitus with diabetic chronic kidney disease: Secondary | ICD-10-CM | POA: Diagnosis not present

## 2019-06-02 DIAGNOSIS — I87313 Chronic venous hypertension (idiopathic) with ulcer of bilateral lower extremity: Secondary | ICD-10-CM | POA: Diagnosis not present

## 2019-06-02 DIAGNOSIS — L97919 Non-pressure chronic ulcer of unspecified part of right lower leg with unspecified severity: Secondary | ICD-10-CM | POA: Diagnosis not present

## 2019-06-02 DIAGNOSIS — I130 Hypertensive heart and chronic kidney disease with heart failure and stage 1 through stage 4 chronic kidney disease, or unspecified chronic kidney disease: Secondary | ICD-10-CM | POA: Diagnosis not present

## 2019-06-02 DIAGNOSIS — N183 Chronic kidney disease, stage 3 (moderate): Secondary | ICD-10-CM | POA: Diagnosis not present

## 2019-06-02 DIAGNOSIS — I509 Heart failure, unspecified: Secondary | ICD-10-CM | POA: Diagnosis not present

## 2019-06-02 DIAGNOSIS — E1122 Type 2 diabetes mellitus with diabetic chronic kidney disease: Secondary | ICD-10-CM | POA: Diagnosis not present

## 2019-06-09 DIAGNOSIS — L97919 Non-pressure chronic ulcer of unspecified part of right lower leg with unspecified severity: Secondary | ICD-10-CM | POA: Diagnosis not present

## 2019-06-09 DIAGNOSIS — I130 Hypertensive heart and chronic kidney disease with heart failure and stage 1 through stage 4 chronic kidney disease, or unspecified chronic kidney disease: Secondary | ICD-10-CM | POA: Diagnosis not present

## 2019-06-09 DIAGNOSIS — I87313 Chronic venous hypertension (idiopathic) with ulcer of bilateral lower extremity: Secondary | ICD-10-CM | POA: Diagnosis not present

## 2019-06-09 DIAGNOSIS — E1122 Type 2 diabetes mellitus with diabetic chronic kidney disease: Secondary | ICD-10-CM | POA: Diagnosis not present

## 2019-06-09 DIAGNOSIS — N183 Chronic kidney disease, stage 3 (moderate): Secondary | ICD-10-CM | POA: Diagnosis not present

## 2019-06-09 DIAGNOSIS — I509 Heart failure, unspecified: Secondary | ICD-10-CM | POA: Diagnosis not present

## 2019-06-10 ENCOUNTER — Other Ambulatory Visit: Payer: Self-pay | Admitting: Physician Assistant

## 2019-06-10 DIAGNOSIS — L98491 Non-pressure chronic ulcer of skin of other sites limited to breakdown of skin: Secondary | ICD-10-CM

## 2019-06-10 DIAGNOSIS — L03119 Cellulitis of unspecified part of limb: Secondary | ICD-10-CM

## 2019-06-10 DIAGNOSIS — M62838 Other muscle spasm: Secondary | ICD-10-CM

## 2019-06-10 DIAGNOSIS — M549 Dorsalgia, unspecified: Secondary | ICD-10-CM

## 2019-06-10 DIAGNOSIS — I872 Venous insufficiency (chronic) (peripheral): Secondary | ICD-10-CM

## 2019-06-10 NOTE — Telephone Encounter (Signed)
Tizanidine last filled 06/11/2018 #90 with one refill.   Please advise on refill.

## 2019-06-14 ENCOUNTER — Other Ambulatory Visit: Payer: Self-pay | Admitting: Neurology

## 2019-06-14 DIAGNOSIS — E1122 Type 2 diabetes mellitus with diabetic chronic kidney disease: Secondary | ICD-10-CM

## 2019-06-14 DIAGNOSIS — I87313 Chronic venous hypertension (idiopathic) with ulcer of bilateral lower extremity: Secondary | ICD-10-CM | POA: Diagnosis not present

## 2019-06-14 DIAGNOSIS — N183 Chronic kidney disease, stage 3 unspecified: Secondary | ICD-10-CM

## 2019-06-14 DIAGNOSIS — I509 Heart failure, unspecified: Secondary | ICD-10-CM | POA: Diagnosis not present

## 2019-06-14 DIAGNOSIS — I130 Hypertensive heart and chronic kidney disease with heart failure and stage 1 through stage 4 chronic kidney disease, or unspecified chronic kidney disease: Secondary | ICD-10-CM | POA: Diagnosis not present

## 2019-06-14 DIAGNOSIS — L97919 Non-pressure chronic ulcer of unspecified part of right lower leg with unspecified severity: Secondary | ICD-10-CM | POA: Diagnosis not present

## 2019-06-14 MED ORDER — GLIMEPIRIDE 4 MG PO TABS: 4.0000 mg | ORAL_TABLET | Freq: Two times a day (BID) | ORAL | 0 refills | Status: AC

## 2019-06-17 ENCOUNTER — Other Ambulatory Visit: Payer: Self-pay | Admitting: Physician Assistant

## 2019-06-17 DIAGNOSIS — E782 Mixed hyperlipidemia: Secondary | ICD-10-CM

## 2019-06-23 DIAGNOSIS — N183 Chronic kidney disease, stage 3 (moderate): Secondary | ICD-10-CM | POA: Diagnosis not present

## 2019-06-23 DIAGNOSIS — I509 Heart failure, unspecified: Secondary | ICD-10-CM | POA: Diagnosis not present

## 2019-06-23 DIAGNOSIS — I87313 Chronic venous hypertension (idiopathic) with ulcer of bilateral lower extremity: Secondary | ICD-10-CM | POA: Diagnosis not present

## 2019-06-23 DIAGNOSIS — L97919 Non-pressure chronic ulcer of unspecified part of right lower leg with unspecified severity: Secondary | ICD-10-CM | POA: Diagnosis not present

## 2019-06-23 DIAGNOSIS — I130 Hypertensive heart and chronic kidney disease with heart failure and stage 1 through stage 4 chronic kidney disease, or unspecified chronic kidney disease: Secondary | ICD-10-CM | POA: Diagnosis not present

## 2019-06-23 DIAGNOSIS — E1122 Type 2 diabetes mellitus with diabetic chronic kidney disease: Secondary | ICD-10-CM | POA: Diagnosis not present

## 2019-06-24 ENCOUNTER — Other Ambulatory Visit: Payer: Self-pay | Admitting: Physician Assistant

## 2019-06-24 DIAGNOSIS — N183 Chronic kidney disease, stage 3 unspecified: Secondary | ICD-10-CM

## 2019-06-24 DIAGNOSIS — E1122 Type 2 diabetes mellitus with diabetic chronic kidney disease: Secondary | ICD-10-CM

## 2019-06-30 DIAGNOSIS — L97919 Non-pressure chronic ulcer of unspecified part of right lower leg with unspecified severity: Secondary | ICD-10-CM | POA: Diagnosis not present

## 2019-06-30 DIAGNOSIS — I130 Hypertensive heart and chronic kidney disease with heart failure and stage 1 through stage 4 chronic kidney disease, or unspecified chronic kidney disease: Secondary | ICD-10-CM | POA: Diagnosis not present

## 2019-06-30 DIAGNOSIS — I87313 Chronic venous hypertension (idiopathic) with ulcer of bilateral lower extremity: Secondary | ICD-10-CM | POA: Diagnosis not present

## 2019-06-30 DIAGNOSIS — E1122 Type 2 diabetes mellitus with diabetic chronic kidney disease: Secondary | ICD-10-CM | POA: Diagnosis not present

## 2019-06-30 DIAGNOSIS — N183 Chronic kidney disease, stage 3 (moderate): Secondary | ICD-10-CM | POA: Diagnosis not present

## 2019-06-30 DIAGNOSIS — I509 Heart failure, unspecified: Secondary | ICD-10-CM | POA: Diagnosis not present

## 2019-07-01 DIAGNOSIS — Z7984 Long term (current) use of oral hypoglycemic drugs: Secondary | ICD-10-CM | POA: Diagnosis not present

## 2019-07-01 DIAGNOSIS — E1122 Type 2 diabetes mellitus with diabetic chronic kidney disease: Secondary | ICD-10-CM | POA: Diagnosis not present

## 2019-07-01 DIAGNOSIS — E785 Hyperlipidemia, unspecified: Secondary | ICD-10-CM | POA: Diagnosis not present

## 2019-07-01 DIAGNOSIS — I87313 Chronic venous hypertension (idiopathic) with ulcer of bilateral lower extremity: Secondary | ICD-10-CM | POA: Diagnosis not present

## 2019-07-01 DIAGNOSIS — Z7901 Long term (current) use of anticoagulants: Secondary | ICD-10-CM | POA: Diagnosis not present

## 2019-07-01 DIAGNOSIS — I509 Heart failure, unspecified: Secondary | ICD-10-CM | POA: Diagnosis not present

## 2019-07-01 DIAGNOSIS — D509 Iron deficiency anemia, unspecified: Secondary | ICD-10-CM | POA: Diagnosis not present

## 2019-07-01 DIAGNOSIS — I130 Hypertensive heart and chronic kidney disease with heart failure and stage 1 through stage 4 chronic kidney disease, or unspecified chronic kidney disease: Secondary | ICD-10-CM | POA: Diagnosis not present

## 2019-07-01 DIAGNOSIS — I4891 Unspecified atrial fibrillation: Secondary | ICD-10-CM | POA: Diagnosis not present

## 2019-07-01 DIAGNOSIS — M109 Gout, unspecified: Secondary | ICD-10-CM | POA: Diagnosis not present

## 2019-07-01 DIAGNOSIS — F329 Major depressive disorder, single episode, unspecified: Secondary | ICD-10-CM | POA: Diagnosis not present

## 2019-07-01 DIAGNOSIS — L97919 Non-pressure chronic ulcer of unspecified part of right lower leg with unspecified severity: Secondary | ICD-10-CM | POA: Diagnosis not present

## 2019-07-01 DIAGNOSIS — N183 Chronic kidney disease, stage 3 (moderate): Secondary | ICD-10-CM | POA: Diagnosis not present

## 2019-07-01 DIAGNOSIS — M797 Fibromyalgia: Secondary | ICD-10-CM | POA: Diagnosis not present

## 2019-07-12 DIAGNOSIS — L97919 Non-pressure chronic ulcer of unspecified part of right lower leg with unspecified severity: Secondary | ICD-10-CM | POA: Diagnosis not present

## 2019-07-12 DIAGNOSIS — N183 Chronic kidney disease, stage 3 (moderate): Secondary | ICD-10-CM | POA: Diagnosis not present

## 2019-07-12 DIAGNOSIS — E1122 Type 2 diabetes mellitus with diabetic chronic kidney disease: Secondary | ICD-10-CM | POA: Diagnosis not present

## 2019-07-12 DIAGNOSIS — I509 Heart failure, unspecified: Secondary | ICD-10-CM | POA: Diagnosis not present

## 2019-07-12 DIAGNOSIS — I130 Hypertensive heart and chronic kidney disease with heart failure and stage 1 through stage 4 chronic kidney disease, or unspecified chronic kidney disease: Secondary | ICD-10-CM | POA: Diagnosis not present

## 2019-07-12 DIAGNOSIS — I87313 Chronic venous hypertension (idiopathic) with ulcer of bilateral lower extremity: Secondary | ICD-10-CM | POA: Diagnosis not present

## 2019-07-21 DIAGNOSIS — I130 Hypertensive heart and chronic kidney disease with heart failure and stage 1 through stage 4 chronic kidney disease, or unspecified chronic kidney disease: Secondary | ICD-10-CM | POA: Diagnosis not present

## 2019-07-21 DIAGNOSIS — N183 Chronic kidney disease, stage 3 (moderate): Secondary | ICD-10-CM | POA: Diagnosis not present

## 2019-07-21 DIAGNOSIS — I87313 Chronic venous hypertension (idiopathic) with ulcer of bilateral lower extremity: Secondary | ICD-10-CM | POA: Diagnosis not present

## 2019-07-21 DIAGNOSIS — L97919 Non-pressure chronic ulcer of unspecified part of right lower leg with unspecified severity: Secondary | ICD-10-CM | POA: Diagnosis not present

## 2019-07-21 DIAGNOSIS — E1122 Type 2 diabetes mellitus with diabetic chronic kidney disease: Secondary | ICD-10-CM | POA: Diagnosis not present

## 2019-07-21 DIAGNOSIS — I509 Heart failure, unspecified: Secondary | ICD-10-CM | POA: Diagnosis not present

## 2019-07-22 ENCOUNTER — Telehealth: Payer: Self-pay

## 2019-07-22 ENCOUNTER — Ambulatory Visit (INDEPENDENT_AMBULATORY_CARE_PROVIDER_SITE_OTHER): Payer: Medicare Other | Admitting: Physician Assistant

## 2019-07-22 ENCOUNTER — Encounter: Payer: Self-pay | Admitting: Physician Assistant

## 2019-07-22 VITALS — BP 130/76 | Temp 97.0°F | Ht 67.0 in

## 2019-07-22 DIAGNOSIS — F5101 Primary insomnia: Secondary | ICD-10-CM

## 2019-07-22 DIAGNOSIS — J811 Chronic pulmonary edema: Secondary | ICD-10-CM | POA: Diagnosis not present

## 2019-07-22 DIAGNOSIS — L03116 Cellulitis of left lower limb: Secondary | ICD-10-CM | POA: Diagnosis not present

## 2019-07-22 DIAGNOSIS — I890 Lymphedema, not elsewhere classified: Secondary | ICD-10-CM

## 2019-07-22 DIAGNOSIS — I878 Other specified disorders of veins: Secondary | ICD-10-CM | POA: Diagnosis not present

## 2019-07-22 MED ORDER — ZOLPIDEM TARTRATE 5 MG PO TABS: 5.0000 mg | ORAL_TABLET | Freq: Every evening | ORAL | 5 refills | Status: AC | PRN

## 2019-07-22 MED ORDER — DOXYCYCLINE HYCLATE 100 MG PO TABS: 100.0000 mg | ORAL_TABLET | Freq: Two times a day (BID) | ORAL | 0 refills | Status: AC

## 2019-07-22 NOTE — Progress Notes (Signed)
Patient ID: Bonnie Clark, female   DOB: 1941/11/15, 78 y.o.   MRN: [id number] .[name] KitchenVirtual Visit via Telephone Note  I connected with Bonnie Clark on 07/25/19 at  2:40 PM EDT by telephone and verified that I am speaking with the correct person using two identifiers.  Location: Patient: home Provider: clinic   I discussed the limitations, risks, security and privacy concerns of performing an evaluation and management service by telephone and the availability of in person appointments. I also discussed with the patient that there may be a patient responsible charge related to this service. The patient expressed understanding and agreed to proceed.   History of Present Illness: Pt is a 78 yo morbidly obese female with chronic venous stasis, lymphedema, CHF, T2DM who has home health come out weekly for unna boots and wound care. [facility] called this morning and stated her legs were more oozing than normal and more red. Pt admits legs are painful more left than right. She also admits to not sleeping well. When she does not sleep she sits up more causing more time for her legs to hang down. She has tried melatonin, unisom and trazodone for sleep. She denies any CP, SOB, cough, fever, chills, body aches.   .. Active Ambulatory Problems    Diagnosis Date Noted  . Depression 09/11/2011  . Arthritis   . Type 2 diabetes mellitus ([place])   . Hyperlipidemia   . Hypertension   . Thyroid disease   . Morbidly obese ([place]) 09/11/2011  . Chronic insomnia 09/11/2011  . Chronic pain 09/11/2011  . Fibromyalgia 09/11/2011  . Anxiety and depression 09/11/2011  . Fatigue 09/11/2011  . Gout 09/11/2011  . Gait disorder 12/21/2011  . Atrial fibrillation 03/19/2012  . Cardiomyopathy ([place]) 04/21/2012  . Peripheral edema 06/21/2012  . OAB (overactive bladder) 06/21/2012  . Hypersomnolence 06/21/2012  . Other malaise and fatigue 02/14/2013  . Pre-ulcerative corn or callous 02/14/2013  . Osteoarthritis of both hips  10/11/2013  . Lumbar degenerative disc disease 10/11/2013  . Renal insufficiency 03/15/2014  . Venous stasis dermatitis of both lower extremities 04/03/2014  . Physical deconditioning 05/31/2014  . Ganglion cyst 03/12/2015  . Balance problems 03/06/2017  . Weakness of lower extremity 03/06/2017  . Cellulitis of lower extremity 05/13/2017  . Lower leg edema 05/13/2017  . Primary osteoarthritis of left shoulder 06/08/2017  . Mass of forearm, left 06/08/2017  . Acute on chronic congestive heart failure ([place]) 08/06/2017  . CKD (chronic kidney disease) stage 3, GFR 30-59 ml/min (HCC) 08/07/2017  . Normocytic anemia 08/07/2017  . SOB (shortness of breath) 08/07/2017  . Low hemoglobin 08/11/2017  . Chronic pulmonary edema 08/13/2017  . Weakness of both lower extremities 10/17/2017  . Hypoglycemia 03/04/2018  . Shaky 03/04/2018  . Left cervical radiculopathy 07/15/2018  . Pulmonary edema cardiac cause ([place]) 08/09/2018  . Superficial ulcer of skin ([place]) 12/16/2018  . Primary insomnia 07/25/2019  . Chronic venous stasis 07/25/2019   Resolved Ambulatory Problems    Diagnosis Date Noted  . Spinal stenosis 09/11/2011  . Sciatica of right side 09/11/2011  . Chest pain 03/05/2012  . Urinary incontinence 03/05/2012  . Abscess of heel, left 02/14/2013  . Osteoarthritis of right hip 01/30/2014  . Pressure ulcer stage II 03/12/2015  . Olecranon bursitis of left elbow 06/29/2015  . Venous stasis ulcer of thigh limited to breakdown of skin with varicose veins ([place]) 12/16/2016  . Decubitus ulcer of left buttock, stage 2 ([place]) 08/13/2017   Past Medical History:  Diagnosis Date  . Diabetes mellitus   . Gout 09/11/2011  . Obesity 09/11/2011  . Urine incontinence    Reviewed problem, medication, allergy list.     Observations/Objective:     No labored breathing.  Normal mood.  .. Today's Vitals   07/22/19 1410  BP: 130/76  Temp: (!) 97 F (36.1 C)  TempSrc: Oral  Height: 5\' 7"   (1.702 m)   Body mass index is 51.69 kg/m.  Assessment and Plan: [name] KitchenMarland KitchenMahya was seen today for leg pain.  Diagnoses and all orders for this visit:  Left leg cellulitis -     doxycycline (VIBRA-TABS) 100 MG tablet; Take 1 tablet (100 mg total) by mouth 2 (two) times daily.  Chronic pulmonary edema  Primary insomnia -     zolpidem (AMBIEN) 5 MG tablet; Take 1 tablet (5 mg total) by mouth at bedtime as needed for sleep.  Chronic venous stasis -     doxycycline (VIBRA-TABS) 100 MG tablet; Take 1 tablet (100 mg total) by mouth 2 (two) times daily.  Lymphedema of both lower extremities -     doxycycline (VIBRA-TABS) 100 MG tablet; Take 1 tablet (100 mg total) by mouth 2 (two) times daily.   Treated with doxycycline for leg sores/cellulitis. I do think not sleeping and having more time with legs downs could be making swelling worse. Discussed good sleep hygiene. Will try ambien at night for sleep. [facility] to continue with weekly unna boot. Vitals look good today. Continue with diuretic.    Follow Up Instructions:    I discussed the assessment and treatment plan with the patient. The patient was provided an opportunity to ask questions and all were answered. The patient agreed with the plan and demonstrated an understanding of the instructions.   The patient was advised to call back or seek an in-person evaluation if the symptoms worsen or if the condition fails to improve as anticipated.  I provided 15 minutes of non-face-to-face time during this encounter.   Tandy Gaw, PA-C

## 2019-07-22 NOTE — Progress Notes (Signed)
Legs - times when they look and feel better  Has been sleeping in lift chair, legs are hanging down and makes them worse.  [facility] nursing coming once a week to wrap. She had oozing through her bandages. They feel better today after being wrapped.

## 2019-07-22 NOTE — Telephone Encounter (Signed)
Received call from Gregary Signs, nurse with [facility] stating that patient had bilateral extremity infections. Gregary Signs reports there is redness and is warm to the touch. Patient reported pain level at 6/10 and a moderate amount of yellow discharge.   Spoke to patient, she is going to do virtual visit today with PCP. Nurse with [facility] also advised that patient has appt scheduled.

## 2019-07-25 ENCOUNTER — Telehealth: Payer: Self-pay | Admitting: Physician Assistant

## 2019-07-25 DIAGNOSIS — I878 Other specified disorders of veins: Secondary | ICD-10-CM | POA: Insufficient documentation

## 2019-07-25 DIAGNOSIS — F5101 Primary insomnia: Secondary | ICD-10-CM | POA: Insufficient documentation

## 2019-07-25 NOTE — Telephone Encounter (Signed)
I have not gotten death certificate this am?

## 2019-07-26 DEATH — deceased

## 2019-07-27 NOTE — Telephone Encounter (Signed)
Did you ever receive this?

## 2019-07-27 NOTE — Telephone Encounter (Signed)
Just got it. Filling it out right now.
